# Patient Record
Sex: Female | Born: 1980
Health system: Southern US, Community
[De-identification: ages and names within clinical notes are randomized; demographics above are authoritative.]

## PROBLEM LIST (undated history)

## (undated) ENCOUNTER — Inpatient Hospital Stay (HOSPITAL_COMMUNITY): Payer: Self-pay

## (undated) DIAGNOSIS — R569 Unspecified convulsions: Secondary | ICD-10-CM

## (undated) DIAGNOSIS — Z8674 Personal history of sudden cardiac arrest: Secondary | ICD-10-CM

## (undated) DIAGNOSIS — G9340 Encephalopathy, unspecified: Secondary | ICD-10-CM

## (undated) DIAGNOSIS — R001 Bradycardia, unspecified: Secondary | ICD-10-CM

## (undated) DIAGNOSIS — Z4502 Encounter for adjustment and management of automatic implantable cardiac defibrillator: Secondary | ICD-10-CM

## (undated) DIAGNOSIS — J9601 Acute respiratory failure with hypoxia: Secondary | ICD-10-CM

## (undated) DIAGNOSIS — I469 Cardiac arrest, cause unspecified: Secondary | ICD-10-CM

## (undated) DIAGNOSIS — G931 Anoxic brain damage, not elsewhere classified: Secondary | ICD-10-CM

## (undated) DIAGNOSIS — Z95810 Presence of automatic (implantable) cardiac defibrillator: Secondary | ICD-10-CM

## (undated) DIAGNOSIS — I4901 Ventricular fibrillation: Secondary | ICD-10-CM

## (undated) DIAGNOSIS — Z1501 Genetic susceptibility to malignant neoplasm of breast: Secondary | ICD-10-CM

## (undated) HISTORY — DX: Ventricular fibrillation: I49.01

## (undated) HISTORY — PX: MASTECTOMY W/ SENTINEL NODE BIOPSY: SHX2001

## (undated) HISTORY — DX: Unspecified convulsions: R56.9

## (undated) HISTORY — PX: TUBAL LIGATION: SHX77

## (undated) HISTORY — PX: BREAST RECONSTRUCTION: SHX9

## (undated) HISTORY — DX: Encephalopathy, unspecified: G93.40

## (undated) HISTORY — DX: Bradycardia, unspecified: R00.1

## (undated) HISTORY — DX: Anoxic brain damage, not elsewhere classified: G93.1

## (undated) HISTORY — DX: Cardiac arrest, cause unspecified: I46.9

## (undated) HISTORY — DX: Acute respiratory failure with hypoxia: J96.01

## (undated) HISTORY — DX: Genetic susceptibility to malignant neoplasm of breast: Z15.01

---
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
## 2014-12-22 DIAGNOSIS — Z202 Contact with and (suspected) exposure to infections with a predominantly sexual mode of transmission: Secondary | ICD-10-CM | POA: Diagnosis not present

## 2014-12-22 DIAGNOSIS — Z113 Encounter for screening for infections with a predominantly sexual mode of transmission: Secondary | ICD-10-CM | POA: Diagnosis not present

## 2015-05-28 DIAGNOSIS — R21 Rash and other nonspecific skin eruption: Secondary | ICD-10-CM | POA: Diagnosis not present

## 2015-05-28 DIAGNOSIS — L299 Pruritus, unspecified: Secondary | ICD-10-CM | POA: Diagnosis not present

## 2015-05-28 DIAGNOSIS — T7840XA Allergy, unspecified, initial encounter: Secondary | ICD-10-CM | POA: Diagnosis not present

## 2016-10-21 ENCOUNTER — Emergency Department (HOSPITAL_COMMUNITY): Payer: Medicare Other

## 2016-10-21 ENCOUNTER — Encounter (HOSPITAL_COMMUNITY): Payer: Self-pay | Admitting: Emergency Medicine

## 2016-10-21 ENCOUNTER — Inpatient Hospital Stay (HOSPITAL_COMMUNITY)
Admission: EM | Admit: 2016-10-21 | Discharge: 2016-11-01 | DRG: 286 | Disposition: A | Discharge status: Home Health | Payer: Medicare Other | Attending: Internal Medicine | Admitting: Internal Medicine

## 2016-10-21 ENCOUNTER — Encounter (HOSPITAL_COMMUNITY)
Admission: EM | Disposition: A | Discharge status: Home Health | Payer: Self-pay | Source: Home / Self Care | Attending: Internal Medicine

## 2016-10-21 DIAGNOSIS — I4901 Ventricular fibrillation: Secondary | ICD-10-CM | POA: Diagnosis present

## 2016-10-21 DIAGNOSIS — I420 Dilated cardiomyopathy: Secondary | ICD-10-CM | POA: Diagnosis present

## 2016-10-21 DIAGNOSIS — R739 Hyperglycemia, unspecified: Secondary | ICD-10-CM | POA: Diagnosis present

## 2016-10-21 DIAGNOSIS — R109 Unspecified abdominal pain: Secondary | ICD-10-CM

## 2016-10-21 DIAGNOSIS — K7200 Acute and subacute hepatic failure without coma: Secondary | ICD-10-CM | POA: Diagnosis present

## 2016-10-21 DIAGNOSIS — R251 Tremor, unspecified: Secondary | ICD-10-CM

## 2016-10-21 DIAGNOSIS — E872 Acidosis: Secondary | ICD-10-CM | POA: Diagnosis present

## 2016-10-21 DIAGNOSIS — E876 Hypokalemia: Secondary | ICD-10-CM | POA: Diagnosis present

## 2016-10-21 DIAGNOSIS — G931 Anoxic brain damage, not elsewhere classified: Secondary | ICD-10-CM | POA: Diagnosis present

## 2016-10-21 DIAGNOSIS — E162 Hypoglycemia, unspecified: Secondary | ICD-10-CM | POA: Diagnosis not present

## 2016-10-21 DIAGNOSIS — R9431 Abnormal electrocardiogram [ECG] [EKG]: Secondary | ICD-10-CM | POA: Diagnosis not present

## 2016-10-21 DIAGNOSIS — N390 Urinary tract infection, site not specified: Secondary | ICD-10-CM | POA: Diagnosis present

## 2016-10-21 DIAGNOSIS — Z978 Presence of other specified devices: Secondary | ICD-10-CM

## 2016-10-21 DIAGNOSIS — J9811 Atelectasis: Secondary | ICD-10-CM | POA: Diagnosis present

## 2016-10-21 DIAGNOSIS — R930 Abnormal findings on diagnostic imaging of skull and head, not elsewhere classified: Secondary | ICD-10-CM | POA: Diagnosis not present

## 2016-10-21 DIAGNOSIS — F1010 Alcohol abuse, uncomplicated: Secondary | ICD-10-CM | POA: Diagnosis present

## 2016-10-21 DIAGNOSIS — J9601 Acute respiratory failure with hypoxia: Secondary | ICD-10-CM | POA: Diagnosis present

## 2016-10-21 DIAGNOSIS — J9600 Acute respiratory failure, unspecified whether with hypoxia or hypercapnia: Secondary | ICD-10-CM

## 2016-10-21 DIAGNOSIS — J9690 Respiratory failure, unspecified, unspecified whether with hypoxia or hypercapnia: Secondary | ICD-10-CM

## 2016-10-21 DIAGNOSIS — R2689 Other abnormalities of gait and mobility: Secondary | ICD-10-CM

## 2016-10-21 DIAGNOSIS — Z452 Encounter for adjustment and management of vascular access device: Secondary | ICD-10-CM

## 2016-10-21 DIAGNOSIS — R29898 Other symptoms and signs involving the musculoskeletal system: Secondary | ICD-10-CM

## 2016-10-21 DIAGNOSIS — Z01818 Encounter for other preprocedural examination: Secondary | ICD-10-CM

## 2016-10-21 DIAGNOSIS — Z9289 Personal history of other medical treatment: Secondary | ICD-10-CM

## 2016-10-21 DIAGNOSIS — G9340 Encephalopathy, unspecified: Secondary | ICD-10-CM | POA: Diagnosis present

## 2016-10-21 DIAGNOSIS — Z4682 Encounter for fitting and adjustment of non-vascular catheter: Secondary | ICD-10-CM | POA: Diagnosis not present

## 2016-10-21 DIAGNOSIS — I469 Cardiac arrest, cause unspecified: Principal | ICD-10-CM | POA: Diagnosis present

## 2016-10-21 DIAGNOSIS — J690 Pneumonitis due to inhalation of food and vomit: Secondary | ICD-10-CM | POA: Diagnosis present

## 2016-10-21 DIAGNOSIS — G4089 Other seizures: Secondary | ICD-10-CM | POA: Diagnosis present

## 2016-10-21 DIAGNOSIS — R1310 Dysphagia, unspecified: Secondary | ICD-10-CM | POA: Diagnosis present

## 2016-10-21 DIAGNOSIS — R569 Unspecified convulsions: Secondary | ICD-10-CM

## 2016-10-21 DIAGNOSIS — Z9911 Dependence on respirator [ventilator] status: Secondary | ICD-10-CM

## 2016-10-21 DIAGNOSIS — R0902 Hypoxemia: Secondary | ICD-10-CM

## 2016-10-21 DIAGNOSIS — F17210 Nicotine dependence, cigarettes, uncomplicated: Secondary | ICD-10-CM | POA: Diagnosis present

## 2016-10-21 HISTORY — PX: CARDIAC CATHETERIZATION: SHX172

## 2016-10-21 LAB — COMPREHENSIVE METABOLIC PANEL
ALK PHOS: 91 U/L (ref 38–126)
ALT: 679 U/L — ABNORMAL HIGH (ref 14–54)
ANION GAP: 14 (ref 5–15)
AST: 732 U/L — ABNORMAL HIGH (ref 15–41)
Albumin: 3.4 g/dL — ABNORMAL LOW (ref 3.5–5.0)
BILIRUBIN TOTAL: 0.1 mg/dL — AB (ref 0.3–1.2)
BUN: 12 mg/dL (ref 6–20)
CALCIUM: 8 mg/dL — AB (ref 8.9–10.3)
CO2: 17 mmol/L — ABNORMAL LOW (ref 22–32)
Chloride: 102 mmol/L (ref 101–111)
Creatinine, Ser: 1.26 mg/dL — ABNORMAL HIGH (ref 0.44–1.00)
GFR calc Af Amer: >60 mL/min (ref >60)
GFR, EST NON AFRICAN AMERICAN: 55 mL/min — AB (ref >60)
Glucose, Bld: 233 mg/dL — ABNORMAL HIGH (ref 65–99)
POTASSIUM: 3.5 mmol/L (ref 3.5–5.1)
Sodium: 133 mmol/L — ABNORMAL LOW (ref 135–145)
TOTAL PROTEIN: 6.8 g/dL (ref 6.5–8.1)

## 2016-10-21 LAB — CBC
HEMATOCRIT: 40.7 % (ref 36.0–46.0)
HEMOGLOBIN: 13 g/dL (ref 12.0–15.0)
MCH: 29.5 pg (ref 26.0–34.0)
MCHC: 31.9 g/dL (ref 30.0–36.0)
MCV: 92.3 fL (ref 78.0–100.0)
Platelets: 239 10*3/uL (ref 150–400)
RBC: 4.41 MIL/uL (ref 3.87–5.11)
RDW: 13.6 % (ref 11.5–15.5)
WBC: 14.5 10*3/uL — AB (ref 4.0–10.5)

## 2016-10-21 LAB — I-STAT CHEM 8, ED
BUN: 14 mg/dL (ref 6–20)
CREATININE: 1.1 mg/dL — AB (ref 0.44–1.00)
Calcium, Ion: 1.15 mmol/L (ref 1.15–1.40)
Chloride: 105 mmol/L (ref 101–111)
Glucose, Bld: 229 mg/dL — ABNORMAL HIGH (ref 65–99)
HEMATOCRIT: 41 % (ref 36.0–46.0)
HEMOGLOBIN: 13.9 g/dL (ref 12.0–15.0)
POTASSIUM: 3.6 mmol/L (ref 3.5–5.1)
SODIUM: 141 mmol/L (ref 135–145)
TCO2: 19 mmol/L (ref 0–100)

## 2016-10-21 LAB — I-STAT CG4 LACTIC ACID, ED: LACTIC ACID, VENOUS: 8.5 mmol/L — AB (ref 0.5–1.9)

## 2016-10-21 LAB — MAGNESIUM: MAGNESIUM: 2.2 mg/dL (ref 1.7–2.4)

## 2016-10-21 LAB — I-STAT BETA HCG BLOOD, ED (MC, WL, AP ONLY): I-stat hCG, quantitative: <5 m[IU]/mL (ref <5)

## 2016-10-21 LAB — I-STAT TROPONIN, ED: Troponin i, poc: 0.08 ng/mL (ref 0.00–0.08)

## 2016-10-21 LAB — ETHANOL: ALCOHOL ETHYL (B): 6 mg/dL — AB (ref <5)

## 2016-10-21 SURGERY — LEFT HEART CATH AND CORONARY ANGIOGRAPHY
Anesthesia: LOCAL

## 2016-10-21 MED ORDER — SODIUM CHLORIDE 0.9 % IV BOLUS (SEPSIS)
1000.0000 mL | Freq: Once | INTRAVENOUS | Status: AC
  Administered 2016-10-21: 1000 mL via INTRAVENOUS

## 2016-10-21 MED ORDER — FENTANYL 2500MCG IN NS 250ML (10MCG/ML) PREMIX INFUSION
30.0000 ug/h | INTRAVENOUS | Status: DC
  Administered 2016-10-21: 30 ug/h via INTRAVENOUS
  Filled 2016-10-21: qty 250

## 2016-10-21 MED ORDER — ETOMIDATE 2 MG/ML IV SOLN
INTRAVENOUS | Status: DC | PRN
  Administered 2016-10-21: 30 mg via INTRAVENOUS

## 2016-10-21 MED ORDER — ROCURONIUM BROMIDE 50 MG/5ML IV SOLN
INTRAVENOUS | Status: DC | PRN
  Administered 2016-10-21: 100 mg via INTRAVENOUS

## 2016-10-21 MED ORDER — MIDAZOLAM HCL 2 MG/2ML IJ SOLN
1.0000 mg | INTRAMUSCULAR | Status: DC | PRN
  Administered 2016-10-21: 1 mg via INTRAVENOUS
  Filled 2016-10-21: qty 2

## 2016-10-21 SURGICAL SUPPLY — 10 items
CATH OPTITORQUE TIG 4.0 5F (CATHETERS) ×2 IMPLANT
DEVICE RAD COMP TR BAND LRG (VASCULAR PRODUCTS) ×2 IMPLANT
GLIDESHEATH SLEND A-KIT 6F 20G (SHEATH) ×2 IMPLANT
GUIDEWIRE INQWIRE 1.5J.035X260 (WIRE) ×1 IMPLANT
INQWIRE 1.5J .035X260CM (WIRE) ×2
KIT ENCORE 26 ADVANTAGE (KITS) ×2 IMPLANT
KIT HEART LEFT (KITS) ×2 IMPLANT
PACK CARDIAC CATHETERIZATION (CUSTOM PROCEDURE TRAY) ×2 IMPLANT
TRANSDUCER W/STOPCOCK (MISCELLANEOUS) ×2 IMPLANT
TUBING CIL FLEX 10 FLL-RA (TUBING) ×2 IMPLANT

## 2016-10-21 NOTE — ED Notes (Signed)
Ice packs placed to bilateral axilla and groin.

## 2016-10-21 NOTE — ED Provider Notes (Signed)
[facility] DEPT Provider Note   CSN: [id number] Arrival date & time:        History   Chief Complaint Chief Complaint  Patient presents with  . Cardiac Arrest    HPI Rachel Duncan is a 35 y.o. female.   Cardiac Arrest  Witnessed by:  Bystander Incident location:  Home Condition upon EMS arrival:  Unresponsive Pulse:  Absent Initial cardiac rhythm per EMS:  Ventricular fibrillation Treatments prior to arrival:  ACLS protocol Medications given prior to ED:  Epinephrine Airway:  Intubation in ED Rhythm on admission to ED:  Sinus tachycardia Associated symptoms comment:  Seizure   History reviewed. No pertinent past medical history.  There are no active problems to display for this patient.   History reviewed. No pertinent surgical history.  OB History    No data available       Home Medications    Prior to Admission medications   Not on File    Family History No family history on file.  Social History Social History  Substance Use Topics  . Smoking status: Not on file  . Smokeless tobacco: Not on file  . Alcohol use Not on file     Allergies   Patient has no known allergies.   Review of Systems Review of Systems  Unable to perform ROS: Acuity of condition     Physical Exam Updated Vital Signs BP (!) 83/66   Pulse 69   Temp 98.2 F (36.8 C) (Rectal)   Resp 18   LMP  (LMP Unknown)   SpO2 95%   Physical Exam  Constitutional: She appears well-developed and well-nourished. She appears ill. Cervical collar in place.  HENT:  Head: Normocephalic and atraumatic.  Eyes: Conjunctivae are normal. Pupils are equal, round, and reactive to light.  Neck: Neck supple. No tracheal deviation present.  Cardiovascular: Tachycardia present.  Exam reveals no gallop, no S3, no S4 and no decreased pulses.   No murmur heard. Pulses:      Carotid pulses are 1+ on the right side, and 1+ on the left side.      Radial pulses are 1+ on the right side,  and 1+ on the left side.       Femoral pulses are 1+ on the right side, and 1+ on the left side. Pulmonary/Chest: She is in respiratory distress. She has no wheezes. She has rhonchi. She has no rales.  Abdominal: Soft. Bowel sounds are normal. She exhibits distension.  Musculoskeletal: Normal range of motion. She exhibits no edema or deformity.  Neurological: She is unresponsive. GCS eye subscore is 1. GCS verbal subscore is 1. GCS motor subscore is 1.  Unable to assess full neurologic exam due to patient's status.     ED Treatments / Results  Labs (all labs ordered are listed, but only abnormal results are displayed) Labs Reviewed  I-STAT CHEM 8, ED - Abnormal; Notable for the following:       Result Value   Creatinine, Ser 1.10 (*)    Glucose, Bld 229 (*)    All other components within normal limits  I-STAT CG4 LACTIC ACID, ED - Abnormal; Notable for the following:    Lactic Acid, Venous 8.50 (*)    All other components within normal limits  COMPREHENSIVE METABOLIC PANEL  CBC  MAGNESIUM  ETHANOL  URINALYSIS, ROUTINE W REFLEX MICROSCOPIC (NOT AT [facility])  RAPID URINE DRUG SCREEN, HOSP PERFORMED  I-STAT TROPOININ, ED  I-STAT BETA HCG BLOOD, ED ([facility], [facility], [facility]  ONLY)    EKG  EKG Interpretation None       Radiology No results found.  Procedures Procedure Name: Intubation Date/Time: 10/22/2016 12:19 AM Performed by: Ron Parker, Terre Zabriskie Pre-anesthesia Checklist: Patient identified Oxygen Delivery Method: Nasal cannula Preoxygenation: Pre-oxygenation with 100% oxygen Intubation Type: IV induction and Rapid sequence Laryngoscope Size: Glidescope Grade View: Grade II Tube size: 7.5 mm Number of attempts: 2 Airway Equipment and Method: Stylet Placement Confirmation: ETT inserted through vocal cords under direct vision,  Positive ETCO2,  CO2 detector and Breath sounds checked- equal and bilateral Tube secured with: ETT holder      (including critical care time)  Medications  Ordered in ED Medications  etomidate (AMIDATE) injection (30 mg Intravenous Given 10/21/16 2252)  rocuronium (ZEMURON) injection (100 mg Intravenous Given 10/21/16 2253)  fentaNYL 2573mcg in NS 230mL (54mcg/ml) infusion-PREMIX (not administered)  midazolam (VERSED) injection 1 mg (not administered)     Initial Impression / Assessment and Plan / ED Course  I have reviewed the triage vital signs and the nursing notes.  Pertinent labs & imaging results that were available during my care of the patient were reviewed by me and considered in my medical decision making (see chart for details).  Clinical Course     35 year old female who reports to [place] after cardiac arrest. She reports possible seizure activity as well. A significant medical history reported as chronic alcohol abuse. Report of V. fib in the field C applied and epinephrine given. Return of spontaneous circulation and sinus tachycardia. She had a supraglottic airway in place when she arrived large amount of stomach contents in the mouth and of the tube. Patient was then intubated using etomidate and rocuronium. Initial troponin is 0.08. Initial lactic of 8. Patient is not pregnant chest x-ray shows cardiomegaly. Possibly a dilated cardiomyopathy secondary to alcoholism. CT head shows no acute intracranial abnormalities 5 by I overweight for mole CT read from radiology. Electrolytes are within normal limits but shows kidney and liver dysfunction likely secondary to the cardiac arrest. Patient will go to the cardiology team for Cath Lab and then to the ICU team. Further management of this patient's care please see inpatient team notes.  Final Clinical Impressions(s) / ED Diagnoses   Final diagnoses:  None    New Prescriptions New Prescriptions   No medications on file     Dewaine Conger, MD 10/22/16 0020    Merrily Pew, MD 10/22/16 [id number]

## 2016-10-21 NOTE — ED Triage Notes (Signed)
Patient was at home, had seizure and fell to floor.  FD responded and placed AED and she was shocked x3.  Upon EMS arrival, patient was placed on cardiac monitor, was found to be in V-Fib and shocked x 2.  Patient was given one IV Epi and regained ROSC.  Capnography of 41.  Patient became combative in EMS truck, was given 2.5mg  Versed en route to ED.

## 2016-10-22 ENCOUNTER — Other Ambulatory Visit (HOSPITAL_COMMUNITY): Payer: Self-pay

## 2016-10-22 ENCOUNTER — Encounter (HOSPITAL_COMMUNITY): Payer: Self-pay | Admitting: Radiology

## 2016-10-22 ENCOUNTER — Inpatient Hospital Stay (HOSPITAL_COMMUNITY): Payer: Medicare Other

## 2016-10-22 ENCOUNTER — Emergency Department (HOSPITAL_COMMUNITY): Payer: Medicare Other

## 2016-10-22 DIAGNOSIS — F1010 Alcohol abuse, uncomplicated: Secondary | ICD-10-CM | POA: Diagnosis present

## 2016-10-22 DIAGNOSIS — E876 Hypokalemia: Secondary | ICD-10-CM | POA: Diagnosis present

## 2016-10-22 DIAGNOSIS — J9690 Respiratory failure, unspecified, unspecified whether with hypoxia or hypercapnia: Secondary | ICD-10-CM | POA: Diagnosis not present

## 2016-10-22 DIAGNOSIS — Z4682 Encounter for fitting and adjustment of non-vascular catheter: Secondary | ICD-10-CM | POA: Diagnosis not present

## 2016-10-22 DIAGNOSIS — I420 Dilated cardiomyopathy: Secondary | ICD-10-CM | POA: Diagnosis not present

## 2016-10-22 DIAGNOSIS — F17210 Nicotine dependence, cigarettes, uncomplicated: Secondary | ICD-10-CM | POA: Diagnosis present

## 2016-10-22 DIAGNOSIS — J9811 Atelectasis: Secondary | ICD-10-CM | POA: Diagnosis not present

## 2016-10-22 DIAGNOSIS — J9601 Acute respiratory failure with hypoxia: Secondary | ICD-10-CM | POA: Diagnosis present

## 2016-10-22 DIAGNOSIS — R9431 Abnormal electrocardiogram [ECG] [EKG]: Secondary | ICD-10-CM | POA: Diagnosis not present

## 2016-10-22 DIAGNOSIS — I469 Cardiac arrest, cause unspecified: Secondary | ICD-10-CM | POA: Diagnosis present

## 2016-10-22 DIAGNOSIS — G4089 Other seizures: Secondary | ICD-10-CM | POA: Diagnosis not present

## 2016-10-22 DIAGNOSIS — R1310 Dysphagia, unspecified: Secondary | ICD-10-CM | POA: Diagnosis not present

## 2016-10-22 DIAGNOSIS — R569 Unspecified convulsions: Secondary | ICD-10-CM | POA: Diagnosis not present

## 2016-10-22 DIAGNOSIS — J690 Pneumonitis due to inhalation of food and vomit: Secondary | ICD-10-CM | POA: Diagnosis not present

## 2016-10-22 DIAGNOSIS — G9340 Encephalopathy, unspecified: Secondary | ICD-10-CM

## 2016-10-22 DIAGNOSIS — R739 Hyperglycemia, unspecified: Secondary | ICD-10-CM | POA: Diagnosis present

## 2016-10-22 DIAGNOSIS — N390 Urinary tract infection, site not specified: Secondary | ICD-10-CM | POA: Diagnosis not present

## 2016-10-22 DIAGNOSIS — R930 Abnormal findings on diagnostic imaging of skull and head, not elsewhere classified: Secondary | ICD-10-CM | POA: Diagnosis not present

## 2016-10-22 DIAGNOSIS — G931 Anoxic brain damage, not elsewhere classified: Secondary | ICD-10-CM | POA: Diagnosis not present

## 2016-10-22 DIAGNOSIS — K7200 Acute and subacute hepatic failure without coma: Secondary | ICD-10-CM | POA: Diagnosis not present

## 2016-10-22 DIAGNOSIS — R0602 Shortness of breath: Secondary | ICD-10-CM | POA: Diagnosis not present

## 2016-10-22 DIAGNOSIS — Z452 Encounter for adjustment and management of vascular access device: Secondary | ICD-10-CM | POA: Diagnosis not present

## 2016-10-22 DIAGNOSIS — R109 Unspecified abdominal pain: Secondary | ICD-10-CM | POA: Diagnosis not present

## 2016-10-22 DIAGNOSIS — I4901 Ventricular fibrillation: Secondary | ICD-10-CM | POA: Diagnosis not present

## 2016-10-22 DIAGNOSIS — J9600 Acute respiratory failure, unspecified whether with hypoxia or hypercapnia: Secondary | ICD-10-CM | POA: Diagnosis not present

## 2016-10-22 DIAGNOSIS — E162 Hypoglycemia, unspecified: Secondary | ICD-10-CM | POA: Diagnosis not present

## 2016-10-22 DIAGNOSIS — E872 Acidosis: Secondary | ICD-10-CM | POA: Diagnosis not present

## 2016-10-22 DIAGNOSIS — R102 Pelvic and perineal pain: Secondary | ICD-10-CM | POA: Diagnosis not present

## 2016-10-22 LAB — POCT I-STAT, CHEM 8
BUN: 12 mg/dL (ref 6–20)
BUN: 11 mg/dL (ref 6–20)
CALCIUM ION: 1.11 mmol/L — AB (ref 1.15–1.40)
CALCIUM ION: 1.1 mmol/L — AB (ref 1.15–1.40)
CREATININE: 0.7 mg/dL (ref 0.44–1.00)
CREATININE: 0.6 mg/dL (ref 0.44–1.00)
Chloride: 113 mmol/L — ABNORMAL HIGH (ref 101–111)
Chloride: 114 mmol/L — ABNORMAL HIGH (ref 101–111)
GLUCOSE: 165 mg/dL — AB (ref 65–99)
GLUCOSE: 167 mg/dL — AB (ref 65–99)
HCT: 34 % — ABNORMAL LOW (ref 36.0–46.0)
HEMATOCRIT: 37 % (ref 36.0–46.0)
HEMOGLOBIN: 12.6 g/dL (ref 12.0–15.0)
HEMOGLOBIN: 11.6 g/dL — AB (ref 12.0–15.0)
POTASSIUM: 4.2 mmol/L (ref 3.5–5.1)
Potassium: 4 mmol/L (ref 3.5–5.1)
Sodium: 144 mmol/L (ref 135–145)
Sodium: 143 mmol/L (ref 135–145)
TCO2: 18 mmol/L (ref 0–100)
TCO2: 15 mmol/L (ref 0–100)

## 2016-10-22 LAB — BASIC METABOLIC PANEL
ANION GAP: 10 (ref 5–15)
Anion gap: 5 (ref 5–15)
Anion gap: 5 (ref 5–15)
Anion gap: 6 (ref 5–15)
Anion gap: 6 (ref 5–15)
BUN: 11 mg/dL (ref 6–20)
BUN: 10 mg/dL (ref 6–20)
BUN: 8 mg/dL (ref 6–20)
BUN: 7 mg/dL (ref 6–20)
BUN: 5 mg/dL — AB (ref 6–20)
CALCIUM: 7.5 mg/dL — AB (ref 8.9–10.3)
CHLORIDE: 115 mmol/L — AB (ref 101–111)
CHLORIDE: 116 mmol/L — AB (ref 101–111)
CO2: 17 mmol/L — ABNORMAL LOW (ref 22–32)
CO2: 16 mmol/L — ABNORMAL LOW (ref 22–32)
CO2: 16 mmol/L — ABNORMAL LOW (ref 22–32)
CO2: 17 mmol/L — ABNORMAL LOW (ref 22–32)
CO2: 16 mmol/L — ABNORMAL LOW (ref 22–32)
CREATININE: 0.61 mg/dL (ref 0.44–1.00)
CREATININE: 0.52 mg/dL (ref 0.44–1.00)
CREATININE: 0.52 mg/dL (ref 0.44–1.00)
CREATININE: 0.51 mg/dL (ref 0.44–1.00)
Calcium: 7.6 mg/dL — ABNORMAL LOW (ref 8.9–10.3)
Calcium: 8 mg/dL — ABNORMAL LOW (ref 8.9–10.3)
Calcium: 8.1 mg/dL — ABNORMAL LOW (ref 8.9–10.3)
Calcium: 8.1 mg/dL — ABNORMAL LOW (ref 8.9–10.3)
Chloride: 111 mmol/L (ref 101–111)
Chloride: 117 mmol/L — ABNORMAL HIGH (ref 101–111)
Chloride: 115 mmol/L — ABNORMAL HIGH (ref 101–111)
Creatinine, Ser: 1 mg/dL (ref 0.44–1.00)
GFR calc Af Amer: >60 mL/min (ref >60)
GFR calc Af Amer: >60 mL/min (ref >60)
GFR calc Af Amer: >60 mL/min (ref >60)
GFR calc Af Amer: >60 mL/min (ref >60)
GFR calc Af Amer: >60 mL/min (ref >60)
GFR calc non Af Amer: >60 mL/min (ref >60)
GLUCOSE: 147 mg/dL — AB (ref 65–99)
GLUCOSE: 140 mg/dL — AB (ref 65–99)
GLUCOSE: 121 mg/dL — AB (ref 65–99)
GLUCOSE: 118 mg/dL — AB (ref 65–99)
Glucose, Bld: 240 mg/dL — ABNORMAL HIGH (ref 65–99)
POTASSIUM: 3.3 mmol/L — AB (ref 3.5–5.1)
POTASSIUM: 3.9 mmol/L (ref 3.5–5.1)
POTASSIUM: 3.7 mmol/L (ref 3.5–5.1)
POTASSIUM: 3.4 mmol/L — AB (ref 3.5–5.1)
POTASSIUM: 3.7 mmol/L (ref 3.5–5.1)
SODIUM: 138 mmol/L (ref 135–145)
SODIUM: 138 mmol/L (ref 135–145)
SODIUM: 136 mmol/L (ref 135–145)
Sodium: 138 mmol/L (ref 135–145)
Sodium: 138 mmol/L (ref 135–145)

## 2016-10-22 LAB — RAPID URINE DRUG SCREEN, HOSP PERFORMED
AMPHETAMINES: NOT DETECTED
Barbiturates: NOT DETECTED
Benzodiazepines: POSITIVE — AB
Cocaine: NOT DETECTED
OPIATES: NOT DETECTED
TETRAHYDROCANNABINOL: NOT DETECTED

## 2016-10-22 LAB — URINALYSIS, ROUTINE W REFLEX MICROSCOPIC
BILIRUBIN URINE: NEGATIVE
Bilirubin Urine: NEGATIVE
Glucose, UA: 250 mg/dL — AB
Glucose, UA: NEGATIVE mg/dL
KETONES UR: NEGATIVE mg/dL
Ketones, ur: NEGATIVE mg/dL
Leukocytes, UA: NEGATIVE
NITRITE: POSITIVE — AB
Nitrite: NEGATIVE
PH: 6 (ref 5.0–8.0)
Protein, ur: >300 mg/dL — AB
Protein, ur: NEGATIVE mg/dL
SPECIFIC GRAVITY, URINE: 1.027 (ref 1.005–1.030)
SPECIFIC GRAVITY, URINE: 1.016 (ref 1.005–1.030)
pH: 5 (ref 5.0–8.0)

## 2016-10-22 LAB — PROTIME-INR
INR: 1.28
INR: 1.23
PROTHROMBIN TIME: 16.1 s — AB (ref 11.4–15.2)
PROTHROMBIN TIME: 15.6 s — AB (ref 11.4–15.2)

## 2016-10-22 LAB — BLOOD GAS, ARTERIAL
ACID-BASE DEFICIT: 10.7 mmol/L — AB (ref 0.0–2.0)
Acid-base deficit: 9.5 mmol/L — ABNORMAL HIGH (ref 0.0–2.0)
BICARBONATE: 16.3 mmol/L — AB (ref 20.0–28.0)
Bicarbonate: 16.4 mmol/L — ABNORMAL LOW (ref 20.0–28.0)
DRAWN BY: 24513
Drawn by: 252031
FIO2: 0.4
FIO2: 40
LHR: 14 {breaths}/min
MECHVT: 400 mL
O2 SAT: 98.7 %
O2 SAT: 98.3 %
PATIENT TEMPERATURE: 98.6
PATIENT TEMPERATURE: 94.3
PCO2 ART: 38.8 mmHg (ref 32.0–48.0)
PCO2 ART: 42.3 mmHg (ref 32.0–48.0)
PEEP/CPAP: 5 cmH2O
PEEP: 5 cmH2O
PH ART: 7.248 — AB (ref 7.350–7.450)
PH ART: 7.195 — AB (ref 7.350–7.450)
PO2 ART: 171 mmHg — AB (ref 83.0–108.0)
PO2 ART: 146 mmHg — AB (ref 83.0–108.0)
RATE: 14 resp/min
VT: 400 mL

## 2016-10-22 LAB — APTT
aPTT: 21 seconds — ABNORMAL LOW (ref 24–36)
aPTT: 29 seconds (ref 24–36)

## 2016-10-22 LAB — POCT I-STAT 3, ART BLOOD GAS (G3+)
ACID-BASE DEFICIT: 13 mmol/L — AB (ref 0.0–2.0)
BICARBONATE: 13.4 mmol/L — AB (ref 20.0–28.0)
O2 SAT: 100 %
PO2 ART: 188 mmHg — AB (ref 83.0–108.0)
TCO2: 14 mmol/L (ref 0–100)
pCO2 arterial: 25.1 mmHg — ABNORMAL LOW (ref 32.0–48.0)
pH, Arterial: 7.31 — ABNORMAL LOW (ref 7.350–7.450)

## 2016-10-22 LAB — TROPONIN I
TROPONIN I: 0.81 ng/mL — AB (ref <0.03)
TROPONIN I: 0.68 ng/mL — AB (ref <0.03)
Troponin I: 0.88 ng/mL (ref <0.03)
Troponin I: 1.19 ng/mL (ref <0.03)

## 2016-10-22 LAB — LACTIC ACID, PLASMA
LACTIC ACID, VENOUS: 2 mmol/L — AB (ref 0.5–1.9)
LACTIC ACID, VENOUS: 1.6 mmol/L (ref 0.5–1.9)
Lactic Acid, Venous: 4.5 mmol/L (ref 0.5–1.9)

## 2016-10-22 LAB — PHOSPHORUS: PHOSPHORUS: 2.8 mg/dL (ref 2.5–4.6)

## 2016-10-22 LAB — GLUCOSE, CAPILLARY
GLUCOSE-CAPILLARY: 161 mg/dL — AB (ref 65–99)
GLUCOSE-CAPILLARY: 147 mg/dL — AB (ref 65–99)
GLUCOSE-CAPILLARY: 131 mg/dL — AB (ref 65–99)
GLUCOSE-CAPILLARY: 157 mg/dL — AB (ref 65–99)
GLUCOSE-CAPILLARY: 133 mg/dL — AB (ref 65–99)
GLUCOSE-CAPILLARY: 130 mg/dL — AB (ref 65–99)
GLUCOSE-CAPILLARY: 123 mg/dL — AB (ref 65–99)
Glucose-Capillary: 173 mg/dL — ABNORMAL HIGH (ref 65–99)
Glucose-Capillary: 156 mg/dL — ABNORMAL HIGH (ref 65–99)
Glucose-Capillary: 131 mg/dL — ABNORMAL HIGH (ref 65–99)
Glucose-Capillary: 145 mg/dL — ABNORMAL HIGH (ref 65–99)
Glucose-Capillary: 114 mg/dL — ABNORMAL HIGH (ref 65–99)

## 2016-10-22 LAB — CBC
HCT: 37 % (ref 36.0–46.0)
HEMOGLOBIN: 12.1 g/dL (ref 12.0–15.0)
MCH: 29.7 pg (ref 26.0–34.0)
MCHC: 32.7 g/dL (ref 30.0–36.0)
MCV: 90.9 fL (ref 78.0–100.0)
PLATELETS: 238 10*3/uL (ref 150–400)
RBC: 4.07 MIL/uL (ref 3.87–5.11)
RDW: 13.5 % (ref 11.5–15.5)
WBC: 12.3 10*3/uL — ABNORMAL HIGH (ref 4.0–10.5)

## 2016-10-22 LAB — URINE MICROSCOPIC-ADD ON

## 2016-10-22 LAB — HEPATIC FUNCTION PANEL
ALK PHOS: 45 U/L (ref 38–126)
ALT: 512 U/L — AB (ref 14–54)
AST: 793 U/L — AB (ref 15–41)
Albumin: 2.8 g/dL — ABNORMAL LOW (ref 3.5–5.0)
TOTAL PROTEIN: 5.3 g/dL — AB (ref 6.5–8.1)
Total Bilirubin: 0.4 mg/dL (ref 0.3–1.2)

## 2016-10-22 LAB — CK TOTAL AND CKMB (NOT AT ARMC)
CK, MB: 81.5 ng/mL — ABNORMAL HIGH (ref 0.5–5.0)
RELATIVE INDEX: 2.1 (ref 0.0–2.5)
Total CK: 3820 U/L — ABNORMAL HIGH (ref 38–234)

## 2016-10-22 LAB — MRSA PCR SCREENING: MRSA by PCR: NEGATIVE

## 2016-10-22 LAB — MAGNESIUM: MAGNESIUM: 1.7 mg/dL (ref 1.7–2.4)

## 2016-10-22 MED ORDER — SODIUM CHLORIDE 0.9 % IV SOLN
INTRAVENOUS | Status: AC
  Administered 2016-10-22: 04:00:00 via INTRAVENOUS

## 2016-10-22 MED ORDER — VERAPAMIL HCL 2.5 MG/ML IV SOLN
INTRAVENOUS | Status: AC
  Filled 2016-10-22: qty 2

## 2016-10-22 MED ORDER — VERAPAMIL HCL 2.5 MG/ML IV SOLN
INTRA_ARTERIAL | Status: DC | PRN
  Administered 2016-10-22: 7.5 mL via INTRA_ARTERIAL

## 2016-10-22 MED ORDER — CISATRACURIUM BOLUS VIA INFUSION
0.1000 mg/kg | Freq: Once | INTRAVENOUS | Status: AC
  Administered 2016-10-22: 7.7 mg via INTRAVENOUS
  Filled 2016-10-22: qty 8

## 2016-10-22 MED ORDER — SODIUM CHLORIDE 0.9% FLUSH
3.0000 mL | Freq: Two times a day (BID) | INTRAVENOUS | Status: DC
  Administered 2016-10-22: 3 mL via INTRAVENOUS

## 2016-10-22 MED ORDER — SODIUM CHLORIDE 0.9 % IV SOLN
1.0000 ug/kg/min | INTRAVENOUS | Status: DC
  Administered 2016-10-22: 1 ug/kg/min via INTRAVENOUS
  Filled 2016-10-22 (×2): qty 20

## 2016-10-22 MED ORDER — FENTANYL CITRATE (PF) 100 MCG/2ML IJ SOLN: 100.0000 ug | Freq: Once | INTRAMUSCULAR | Status: DC

## 2016-10-22 MED ORDER — SODIUM CHLORIDE 0.9 % IV SOLN
2.0000 mg/h | INTRAVENOUS | Status: DC
  Administered 2016-10-22: 4 mg/h via INTRAVENOUS
  Administered 2016-10-23: 8 mg/h via INTRAVENOUS
  Administered 2016-10-23 – 2016-10-24 (×2): 6 mg/h via INTRAVENOUS
  Filled 2016-10-22 (×7): qty 10

## 2016-10-22 MED ORDER — SODIUM CHLORIDE 0.9% FLUSH
10.0000 mL | INTRAVENOUS | Status: DC | PRN
  Administered 2016-10-26: 20 mL
  Filled 2016-10-22: qty 40

## 2016-10-22 MED ORDER — HEPARIN SODIUM (PORCINE) 5000 UNIT/ML IJ SOLN
5000.0000 [IU] | Freq: Three times a day (TID) | INTRAMUSCULAR | Status: DC
  Administered 2016-10-22 – 2016-10-26 (×15): 5000 [IU] via SUBCUTANEOUS
  Filled 2016-10-22 (×16): qty 1

## 2016-10-22 MED ORDER — FAMOTIDINE IN NACL 20-0.9 MG/50ML-% IV SOLN: 20.0000 mg | Freq: Two times a day (BID) | INTRAVENOUS | Status: DC

## 2016-10-22 MED ORDER — ASPIRIN 81 MG PO CHEW: 324.0000 mg | CHEWABLE_TABLET | ORAL | Status: DC

## 2016-10-22 MED ORDER — SODIUM CHLORIDE 0.9 % IV SOLN: 250.0000 mL | INTRAVENOUS | Status: DC | PRN

## 2016-10-22 MED ORDER — MEPERIDINE HCL 25 MG/ML IJ SOLN
25.0000 mg | INTRAMUSCULAR | Status: AC
  Administered 2016-10-22: 25 mg via INTRAVENOUS
  Filled 2016-10-22: qty 1

## 2016-10-22 MED ORDER — MAGNESIUM SULFATE 2 GM/50ML IV SOLN
2.0000 g | Freq: Once | INTRAVENOUS | Status: AC
  Administered 2016-10-22: 2 g via INTRAVENOUS
  Filled 2016-10-22: qty 50

## 2016-10-22 MED ORDER — FENTANYL BOLUS VIA INFUSION
50.0000 ug | INTRAVENOUS | Status: DC | PRN
  Filled 2016-10-22: qty 50

## 2016-10-22 MED ORDER — SODIUM BICARBONATE 8.4 % IV SOLN
INTRAVENOUS | Status: DC
  Administered 2016-10-22 – 2016-10-23 (×2): via INTRAVENOUS
  Filled 2016-10-22 (×5): qty 100

## 2016-10-22 MED ORDER — DEXTROSE 5 % IV SOLN
2.0000 g | Freq: Every day | INTRAVENOUS | Status: AC
  Administered 2016-10-22 – 2016-10-28 (×7): 2 g via INTRAVENOUS
  Filled 2016-10-22 (×7): qty 2

## 2016-10-22 MED ORDER — LORAZEPAM 2 MG/ML IJ SOLN
INTRAMUSCULAR | Status: AC
  Administered 2016-10-22: 4 mg
  Filled 2016-10-22: qty 2

## 2016-10-22 MED ORDER — HEPARIN SODIUM (PORCINE) 1000 UNIT/ML IJ SOLN
INTRAMUSCULAR | Status: AC
  Filled 2016-10-22: qty 1

## 2016-10-22 MED ORDER — LIDOCAINE HCL (PF) 1 % IJ SOLN
INTRAMUSCULAR | Status: DC | PRN
Start: 2016-10-22 — End: 2016-10-22
  Administered 2016-10-22: 2 mL via INTRADERMAL

## 2016-10-22 MED ORDER — SODIUM CHLORIDE 0.9% FLUSH
10.0000 mL | Freq: Two times a day (BID) | INTRAVENOUS | Status: DC
  Administered 2016-10-22 – 2016-10-24 (×7): 10 mL
  Administered 2016-10-25: 20 mL
  Administered 2016-10-25 – 2016-10-27 (×3): 10 mL
  Administered 2016-10-27: 20 mL
  Administered 2016-10-28 – 2016-10-29 (×2): 10 mL

## 2016-10-22 MED ORDER — SODIUM CHLORIDE 0.9 % IV SOLN
1000.0000 mg | Freq: Two times a day (BID) | INTRAVENOUS | Status: DC
  Administered 2016-10-22 – 2016-10-28 (×13): 1000 mg via INTRAVENOUS
  Filled 2016-10-22 (×14): qty 10

## 2016-10-22 MED ORDER — MIDAZOLAM HCL 2 MG/2ML IJ SOLN: 2.0000 mg | Freq: Once | INTRAMUSCULAR | Status: DC

## 2016-10-22 MED ORDER — POTASSIUM CHLORIDE 2 MEQ/ML IV SOLN
30.0000 meq | Freq: Once | INTRAVENOUS | Status: AC
  Administered 2016-10-22: 30 meq via INTRAVENOUS
  Filled 2016-10-22: qty 15

## 2016-10-22 MED ORDER — ARTIFICIAL TEARS OP OINT
1.0000 "application " | TOPICAL_OINTMENT | Freq: Three times a day (TID) | OPHTHALMIC | Status: DC
  Administered 2016-10-22 – 2016-10-23 (×5): 1 via OPHTHALMIC
  Filled 2016-10-22: qty 3.5

## 2016-10-22 MED ORDER — LORAZEPAM 2 MG/ML IJ SOLN
4.0000 mg | Freq: Once | INTRAMUSCULAR | Status: AC
  Administered 2016-10-22: 4 mg via INTRAVENOUS

## 2016-10-22 MED ORDER — HEPARIN (PORCINE) IN NACL 2-0.9 UNIT/ML-% IJ SOLN
INTRAMUSCULAR | Status: AC
Start: 2016-10-22 — End: 2016-10-22
  Filled 2016-10-22: qty 1000

## 2016-10-22 MED ORDER — LIDOCAINE HCL (PF) 1 % IJ SOLN
INTRAMUSCULAR | Status: AC
  Filled 2016-10-22: qty 30

## 2016-10-22 MED ORDER — ENOXAPARIN SODIUM 40 MG/0.4ML ~~LOC~~ SOLN
40.0000 mg | SUBCUTANEOUS | Status: DC
  Filled 2016-10-22: qty 0.4

## 2016-10-22 MED ORDER — LORAZEPAM 2 MG/ML IJ SOLN: 4.0000 mg | Freq: Once | INTRAMUSCULAR | Status: AC

## 2016-10-22 MED ORDER — FAMOTIDINE IN NACL 20-0.9 MG/50ML-% IV SOLN
20.0000 mg | Freq: Two times a day (BID) | INTRAVENOUS | Status: DC
  Administered 2016-10-22 – 2016-10-27 (×11): 20 mg via INTRAVENOUS
  Filled 2016-10-22 (×11): qty 50

## 2016-10-22 MED ORDER — THIAMINE HCL 100 MG/ML IJ SOLN
Freq: Once | INTRAVENOUS | Status: AC
  Administered 2016-10-22: 03:00:00 via INTRAVENOUS
  Filled 2016-10-22: qty 1000

## 2016-10-22 MED ORDER — CHLORHEXIDINE GLUCONATE 0.12% ORAL RINSE (MEDLINE KIT)
15.0000 mL | Freq: Two times a day (BID) | OROMUCOSAL | Status: DC
  Administered 2016-10-22 – 2016-11-01 (×18): 15 mL via OROMUCOSAL

## 2016-10-22 MED ORDER — HEPARIN (PORCINE) IN NACL 2-0.9 UNIT/ML-% IJ SOLN
INTRAMUSCULAR | Status: DC | PRN
Start: 2016-10-22 — End: 2016-10-22
  Administered 2016-10-22: 1500 mL

## 2016-10-22 MED ORDER — SODIUM CHLORIDE 0.9% FLUSH: 3.0000 mL | INTRAVENOUS | Status: DC | PRN

## 2016-10-22 MED ORDER — MIDAZOLAM BOLUS VIA INFUSION
2.0000 mg | INTRAVENOUS | Status: DC | PRN
  Filled 2016-10-22: qty 2

## 2016-10-22 MED ORDER — IOPAMIDOL (ISOVUE-370) INJECTION 76%
INTRAVENOUS | Status: AC
  Filled 2016-10-22: qty 100

## 2016-10-22 MED ORDER — CISATRACURIUM BOLUS VIA INFUSION
0.0500 mg/kg | INTRAVENOUS | Status: DC | PRN
  Filled 2016-10-22: qty 4

## 2016-10-22 MED ORDER — ORAL CARE MOUTH RINSE
15.0000 mL | OROMUCOSAL | Status: DC
  Administered 2016-10-22 – 2016-10-24 (×25): 15 mL via OROMUCOSAL

## 2016-10-22 MED ORDER — SODIUM CHLORIDE 0.9 % IV SOLN
INTRAVENOUS | Status: DC
Start: 2016-10-22 — End: 2016-10-22

## 2016-10-22 MED ORDER — DEXTROSE 5 % IV SOLN
0.0000 ug/min | INTRAVENOUS | Status: DC
  Administered 2016-10-22: 3 ug/min via INTRAVENOUS
  Administered 2016-10-23: 2 ug/min via INTRAVENOUS
  Filled 2016-10-22 (×2): qty 4

## 2016-10-22 MED ORDER — FENTANYL 2500MCG IN NS 250ML (10MCG/ML) PREMIX INFUSION
100.0000 ug/h | INTRAVENOUS | Status: DC
  Administered 2016-10-22: 250 ug/h via INTRAVENOUS
  Administered 2016-10-22: 100 ug/h via INTRAVENOUS
  Administered 2016-10-22: 250 ug/h via INTRAVENOUS
  Administered 2016-10-23: 300 ug/h via INTRAVENOUS
  Administered 2016-10-23: 250 ug/h via INTRAVENOUS
  Administered 2016-10-24: 200 ug/h via INTRAVENOUS
  Filled 2016-10-22 (×5): qty 250

## 2016-10-22 MED ORDER — SODIUM CHLORIDE 0.9 % IV SOLN
250.0000 mL | INTRAVENOUS | Status: DC | PRN
  Administered 2016-10-22: 250 mL via INTRAVENOUS

## 2016-10-22 MED ORDER — HEPARIN SODIUM (PORCINE) 1000 UNIT/ML IJ SOLN
INTRAMUSCULAR | Status: DC | PRN
Start: 2016-10-22 — End: 2016-10-22
  Administered 2016-10-22: 2000 [IU] via INTRAVENOUS

## 2016-10-22 MED ORDER — PYRIDOXINE HCL 100 MG/ML IJ SOLN
100.0000 mg | Freq: Every day | INTRAMUSCULAR | Status: DC
  Administered 2016-10-22 – 2016-10-26 (×5): 100 mg via INTRAVENOUS
  Filled 2016-10-22 (×5): qty 1

## 2016-10-22 MED ORDER — ASPIRIN 300 MG RE SUPP
300.0000 mg | RECTAL | Status: DC
  Filled 2016-10-22: qty 1

## 2016-10-22 MED ORDER — IOPAMIDOL (ISOVUE-370) INJECTION 76%
INTRAVENOUS | Status: DC | PRN
  Administered 2016-10-22: 40 mL via INTRA_ARTERIAL

## 2016-10-22 MED ORDER — NITROGLYCERIN 1 MG/10 ML FOR IR/CATH LAB
INTRA_ARTERIAL | Status: AC
  Filled 2016-10-22: qty 10

## 2016-10-22 NOTE — Consult Note (Signed)
Rachel Duncan is an 35 y.o. female.   Chief Complaint: C. Arrest HPI: Rachel Duncan  is a 35 y.o. female  With C. Arrest history obtained by chart review and ED staff. Patient was at home, had seizure and fell to floor.  FD responded and placed AED and she was shocked x3.  Upon EMS arrival, patient was placed on cardiac monitor, was found to be in V-Fib and shocked x 2.  Patient was given one IV Epi and regained ROSC.   History reviewed. No pertinent past medical history.  No family members available  History reviewed. No pertinent surgical history.  Could not be obtained  No family history on file. Could not be obtained Social History:  has no tobacco, alcohol, and drug history on file.  Not obtainable  Allergies: No Known Allergies  Review of Systems -  patient intubated and unresponsive Blood pressure 158/98, pulse 69, temperature 97.9 F (36.6 C), resp. rate 18, height 5' 5" (1.651 m), SpO2 100 %. General appearance: Intubated and unresponsive. Eyes: Equal pupils Neck: no adenopathy, no carotid bruit, no JVD, supple, symmetrical, trachea midline and thyroid not enlarged, symmetric, no tenderness/mass/nodules Neck: JVPcould not be made out, carotids 2+= without bruits Resp: clear to auscultation bilaterally Cardio: regular rate and rhythm, S1, S2 normal, no murmur, click, rub or gallop and Tachycardia present, distant heart sounds GI: soft, non-tender; bowel sounds normal; no masses,  no organomegaly Extremities: extremities normal, atraumatic, no cyanosis or edema Pulses: 2+ and symmetric Equal pulses in the upper extremities also Skin: Skin color, texture, turgor normal. No rashes or lesions Neurologic: Patient is comatose and on ventilator  Results for orders placed or performed during the hospital encounter of 10/21/16 (from the past 48 hour(s))  Comprehensive metabolic panel     Status: Abnormal   Collection Time: 10/21/16 10:47 PM  Result Value Ref Range   Sodium 133 (L)  135 - 145 mmol/L   Potassium 3.5 3.5 - 5.1 mmol/L   Chloride 102 101 - 111 mmol/L   CO2 17 (L) 22 - 32 mmol/L   Glucose, Bld 233 (H) 65 - 99 mg/dL   BUN 12 6 - 20 mg/dL   Creatinine, Ser 1.26 (H) 0.44 - 1.00 mg/dL   Calcium 8.0 (L) 8.9 - 10.3 mg/dL   Total Protein 6.8 6.5 - 8.1 g/dL   Albumin 3.4 (L) 3.5 - 5.0 g/dL   AST 732 (H) 15 - 41 U/L   ALT 679 (H) 14 - 54 U/L   Alkaline Phosphatase 91 38 - 126 U/L   Total Bilirubin 0.1 (L) 0.3 - 1.2 mg/dL   GFR calc non Af Amer 55 (L) >60 mL/min   GFR calc Af Amer >60 >60 mL/min    Comment: (NOTE) The eGFR has been calculated using the CKD EPI equation. This calculation has not been validated in all clinical situations. eGFR's persistently <60 mL/min signify possible Chronic Kidney Disease.    Anion gap 14 5 - 15  CBC     Status: Abnormal   Collection Time: 10/21/16 10:47 PM  Result Value Ref Range   WBC 14.5 (H) 4.0 - 10.5 K/uL   RBC 4.41 3.87 - 5.11 MIL/uL   Hemoglobin 13.0 12.0 - 15.0 g/dL   HCT 40.7 36.0 - 46.0 %   MCV 92.3 78.0 - 100.0 fL   MCH 29.5 26.0 - 34.0 pg   MCHC 31.9 30.0 - 36.0 g/dL   RDW 13.6 11.5 - 15.5 %   Platelets 239  150 - 400 K/uL  Magnesium     Status: None   Collection Time: 10/21/16 10:47 PM  Result Value Ref Range   Magnesium 2.2 1.7 - 2.4 mg/dL  I-stat troponin, ED (not at [facility], [facility])     Status: None   Collection Time: 10/21/16 11:04 PM  Result Value Ref Range   Troponin i, poc 0.08 0.00 - 0.08 ng/mL   Comment 3            Comment: Due to the release kinetics of cTnI, a negative result within the first hours of the onset of symptoms does not rule out myocardial infarction with certainty. If myocardial infarction is still suspected, repeat the test at appropriate intervals.   I-Stat Chem 8, ED  (not at [facility], [facility])     Status: Abnormal   Collection Time: 10/21/16 11:06 PM  Result Value Ref Range   Sodium 141 135 - 145 mmol/L   Potassium 3.6 3.5 - 5.1 mmol/L   Chloride 105 101 - 111 mmol/L   BUN  14 6 - 20 mg/dL   Creatinine, Ser 1.10 (H) 0.44 - 1.00 mg/dL   Glucose, Bld 229 (H) 65 - 99 mg/dL   Calcium, Ion 1.15 1.15 - 1.40 mmol/L   TCO2 19 0 - 100 mmol/L   Hemoglobin 13.9 12.0 - 15.0 g/dL   HCT 41.0 36.0 - 46.0 %  Ethanol     Status: Abnormal   Collection Time: 10/21/16 11:06 PM  Result Value Ref Range   Alcohol, Ethyl (B) 6 (H) <5 mg/dL    Comment:        LOWEST DETECTABLE LIMIT FOR SERUM ALCOHOL IS 5 mg/dL FOR MEDICAL PURPOSES ONLY   I-Stat CG4 Lactic Acid, ED     Status: Abnormal   Collection Time: 10/21/16 11:06 PM  Result Value Ref Range   Lactic Acid, Venous 8.50 (HH) 0.5 - 1.9 mmol/L   Comment NOTIFIED PHYSICIAN   I-Stat Beta hCG blood, ED ([facility], [facility], [facility] only)     Status: None   Collection Time: 10/21/16 11:10 PM  Result Value Ref Range   I-stat hCG, quantitative <5.0 <5 mIU/mL   Comment 3            Comment:   GEST. AGE      CONC.  (mIU/mL)   <=1 WEEK        5 - 50     2 WEEKS       50 - 500     3 WEEKS       100 - 10,000     4 WEEKS     1,000 - 30,000        FEMALE AND NON-PREGNANT FEMALE:     LESS THAN 5 mIU/mL    Dg Chest Portable 1 View  Result Date: 10/21/2016 CLINICAL DATA:  Intubated EXAM: PORTABLE CHEST 1 VIEW COMPARISON:  10/21/2016 FINDINGS: Endotracheal tube tip has been repositioned and is now proximally 2 cm superior to the carina. Esophageal tube tip is below the diaphragm but is not included. There is persistent cardiomegaly. There is no significant interval change in ground-glass density throughout the left thorax and within the right upper lobe. No pneumothorax. IMPRESSION: 1. Endotracheal tube tip is at the lower margin of the clavicles 2. Cardiomegaly 3. No change in diffuse ground-glass density of the left thorax and within the right upper lobe. Electronically Signed   By: Donavan Foil M.D.   On: 10/21/2016 23:45   Dg Chest  Portable 1 View  Result Date: 10/21/2016 CLINICAL DATA:  ETT placement EXAM: PORTABLE CHEST 1 VIEW COMPARISON:  None.  FINDINGS: Endotracheal tube tip encroaches right main stem bronchus orifice. Esophageal tube tip is below the diaphragm but is not included on the image. Pacer patch is obscure the lower chest. There is cardiomegaly. There is diffuse ground-glass density throughout the left hemi thorax. There is no pneumothorax. IMPRESSION: 1. Endotracheal tube tip in crit she is right mainstem bronchus orifice 2. Cardiomegaly 3. Diffuse ground-glass density in the left thorax could relate to edema, hemorrhage, or pneumonia. Mild similar appearance of ground-glass density in the right upper lobe. Electronically Signed   By: Donavan Foil M.D.   On: 10/21/2016 23:44    Labs:   Lab Results  Component Value Date   WBC 14.5 (H) 10/21/2016   HGB 13.9 10/21/2016   HCT 41.0 10/21/2016   MCV 92.3 10/21/2016   PLT 239 10/21/2016    Recent Labs Lab 10/21/16 2247 10/21/16 2306  NA 133* 141  K 3.5 3.6  CL 102 105  CO2 17*  --   BUN 12 14  CREATININE 1.26* 1.10*  CALCIUM 8.0*  --   PROT 6.8  --   BILITOT 0.1*  --   ALKPHOS 91  --   ALT 679*  --   AST 732*  --   GLUCOSE 233* 229*    EKG: Diffuse ST depressions, sinus tachycardia and frequent PVCs evident on telemetry. Prolonged QT interval.  Current Facility-Administered Medications:  .  [MAR Hold] etomidate (AMIDATE) injection, , Intravenous, PRN, Merrily Pew, MD, 30 mg at 10/21/16 2252 .  fentaNYL 2520mg in NS 2565m(1026mml) infusion-PREMIX, 30 mcg/hr, Intravenous, Continuous, JasMerrily PewD, Last Rate: 3 mL/hr at 10/21/16 2330, 30 mcg/hr at 10/21/16 2330 .  [MAR Hold] midazolam (VERSED) injection 1 mg, 1 mg, Intravenous, Q15 min PRN, JasMerrily PewD, 1 mg at 10/21/16 2330 .  [MAR Hold] rocuronium (ZEMURON) injection, , Intravenous, PRN, JasMerrily PewD, 100 mg at 10/21/16 2253    Assessment/Plan Patient with cardiac arrest, found to be in VF and shockable rhythm. Abnormal EKG with prolonged QT and frequent PVCs and ventricle couplets on  telemetry, proceed with emergent cardiac catheterization to exclude cardiac etiology. If cardiac etiologies excluded, she will have had seizure leading to severe hypoxemia leading to cardiac arrest. No family members available. CT scan of the head grossly normal without hemorrhage.  GANAdrian ProwsD 10/22/2016, 12:11 AM [facility]. [place]ometown[place]: [phone] Office: [phone] no answer: Cell:  [phone]

## 2016-10-22 NOTE — Procedures (Signed)
Arterial Catheter Insertion Procedure Note Rachel Duncan [id number] [birth year]  Procedure: Insertion of Arterial Catheter  Indications: Blood pressure monitoring  Procedure Details Consent: Unable to obtain consent because of emergent medical necessity. Time Out: Verified patient identification, verified procedure, site/side was marked, verified correct patient position, special equipment/implants available, medications/allergies/relevent history reviewed, required imaging and test results available.  Performed  Maximum sterile technique was used including antiseptics, cap, gloves, gown, hand hygiene, mask and sheet. Skin prep: Chlorhexidine; local anesthetic administered 22 gauge catheter was inserted into left radial artery using the Seldinger technique.  Evaluation Blood flow good; BP tracing good. Complications: No apparent complications.   Rachel Duncan 10/22/2016

## 2016-10-22 NOTE — Progress Notes (Addendum)
PULMONARY / CRITICAL CARE MEDICINE   Name: Rachel Duncan MRN: [id number] DOB: [birth year]    ADMISSION DATE:  10/21/2016 CONSULTATION DATE:  10/21/16  REFERRING MD:  ED physician  CHIEF COMPLAINT:  V-fib arrest  HISTORY OF PRESENT ILLNESS:   Rachel Duncan is a 35 y/o woman with history of previous alcohol abuse who was brought to [facility] via EMS after a cardiac arrest at home.  Her mother reports that Rachel Duncan was sitting and looking at her phone after finishing dinner.  She suddenly fell and arched back appearing stiff.  The family called 70 and CPR was started by her brother until EMS arrived.  When EMS arrived she was found to be in V-fib.  She received a total of 6 shocks and 1 round of epinephrine before return of spontaneous circulation.  In the ED it was noted that her oropharynx was full of debris. Per ED report she was not following commands prior to intubation.  From the ED she was taken to the cath lab where she was found to have no significant coronary artery disease and a preserved EF.  She was then transferred to the ICU.  Upon my arrival to the ICU she appeared to be actively seizing.  She was given a total of 8mg  ativan with resolution of seizure activity.   SUBJECTIVE:  (-) recurrence of seizure this am.  Doing TTM  VITAL SIGNS: BP (!) 130/101   Pulse 75   Temp (!) 90.1 F (32.3 C) (Core (Comment))   Resp 20   Ht 5\' 2"  (1.575 m)   Wt 76.6 kg (168 lb 14 oz)   LMP  (LMP Unknown)   SpO2 93%   BMI 30.89 kg/m   HEMODYNAMICS:    VENTILATOR SETTINGS: Vent Mode: PRVC FiO2 (%):  [40 %-100 %] 40 % Set Rate:  [14 bmp-20 bmp] 20 bmp Vt Set:  [400 mL-460 mL] 400 mL PEEP:  [5 cmH20] 5 cmH20 Plateau Pressure:  [12 cmH20-21 cmH20] 12 cmH20  INTAKE / OUTPUT: I/O last 3 completed shifts: In: 543.2 [I.V.:168.2; IV Piggyback:375] Out: 950 [Urine:700; Emesis/NG output:250]  PHYSICAL EXAMINATION: General:  Laying in bed, intubated sedated. paralyzed Neuro:  Sedated HEENT:   Pupils pinpoint, ETT in place, bruise on left lower inner lip. (+) cervical collar in place Cardiovascular:  Tachycardic, no MRG Lungs:  CTAB, no WRR Abdomen:  Obese, non-distended, minimal bowel sounds, no palpable HSM Musculoskeletal:  No edema, no joint abnormalities Skin:  Bruise on forehead, no rashes or other lesions.   LABS:  BMET  Recent Labs Lab 10/21/16 2247  10/22/16 0200 10/22/16 0427 10/22/16 0603  NA 133*  < > 138 144 143  K 3.5  < > 3.3* 4.0 4.2  CL 102  < > 111 113* 114*  CO2 17*  --  17*  --   --   BUN 12  < > 11 12 11   CREATININE 1.26*  < > 1.00 0.70 0.60  GLUCOSE 233*  < > 240* 165* 167*  < > = values in this interval not displayed.  Electrolytes  Recent Labs Lab 10/21/16 2247 10/22/16 0200 10/22/16 0400  CALCIUM 8.0* 7.5*  --   MG 2.2  --  1.7  PHOS  --   --  2.8    CBC  Recent Labs Lab 10/21/16 2247  10/22/16 0400 10/22/16 0427 10/22/16 0603  WBC 14.5*  --  12.3*  --   --   HGB 13.0  < > 12.1 12.6  11.6*  HCT 40.7  < > 37.0 37.0 34.0*  PLT 239  --  238  --   --   < > = values in this interval not displayed.  Coag's  Recent Labs Lab 10/22/16 0200  APTT 21*  INR 1.28    Sepsis Markers  Recent Labs Lab 10/21/16 2306 10/22/16 0200  LATICACIDVEN 8.50* 4.5*    ABG  Recent Labs Lab 10/22/16 0118 10/22/16 0423 10/22/16 0600  PHART 7.248* 7.195* 7.310*  PCO2ART 38.8 42.3 25.1*  PO2ART 171* 146* 188.0*    Liver Enzymes  Recent Labs Lab 10/21/16 2247  AST 732*  ALT 679*  ALKPHOS 91  BILITOT 0.1*  ALBUMIN 3.4*    Cardiac Enzymes  Recent Labs Lab 10/22/16 0200  TROPONINI 0.88*    Glucose  Recent Labs Lab 10/22/16 0308 10/22/16 0424 10/22/16 0603  GLUCAP 173* 161* 156*    Imaging Ct Head Wo Contrast  Result Date: 10/22/2016 CLINICAL DATA:  Post CPR evaluate for bleed EXAM: CT HEAD WITHOUT CONTRAST TECHNIQUE: Contiguous axial images were obtained from the base of the skull through the vertex  without intravenous contrast. COMPARISON:  None. FINDINGS: Brain: No evidence of acute infarction, hemorrhage, hydrocephalus, extra-axial collection or mass lesion/mass effect. Vascular: No hyperdense vessel or unexpected calcification. Skull: Normal. Negative for fracture or focal lesion. Sinuses/Orbits: Mucosal thickening in the sphenoid and ethmoid sinuses. No acute orbital abnormality. Other: None IMPRESSION: No CT evidence for acute intracranial abnormality. Electronically Signed   By: Donavan Foil M.D.   On: 10/22/2016 00:15   Dg Chest Port 1 View  Result Date: 10/22/2016 CLINICAL DATA:  Central line and nasogastric tube placement. Initial encounter. EXAM: PORTABLE CHEST 1 VIEW COMPARISON:  Chest radiograph performed 10/21/2016 FINDINGS: The patient's endotracheal tube is seen ending 3 cm above the carina. A right subclavian line is noted ending overlying the right atrium. This could be retracted 2-3 cm. An enteric tube is noted coiling within the stomach. The lungs are mildly hypoexpanded but appear grossly clear. No pleural effusion or pneumothorax is seen. The cardiomediastinal silhouette is mildly enlarged. No acute osseous abnormalities are identified. External pacing pads are noted. IMPRESSION: 1. Endotracheal tube seen ending 3 cm above the carina. 2. Right subclavian line noted ending overlying the right atrium. This could be retracted 2-3 cm. 3. Lungs mildly hypoexpanded but grossly clear. 4. Mild cardiomegaly. These results were called by telephone at the time of interpretation on 10/22/2016 at 2:49 am to [place] on [facility], who verbally acknowledged these results. Electronically Signed   By: Garald Balding M.D.   On: 10/22/2016 02:51   Dg Chest Portable 1 View  Result Date: 10/21/2016 CLINICAL DATA:  Intubated EXAM: PORTABLE CHEST 1 VIEW COMPARISON:  10/21/2016 FINDINGS: Endotracheal tube tip has been repositioned and is now proximally 2 cm superior to the carina. Esophageal tube tip is  below the diaphragm but is not included. There is persistent cardiomegaly. There is no significant interval change in ground-glass density throughout the left thorax and within the right upper lobe. No pneumothorax. IMPRESSION: 1. Endotracheal tube tip is at the lower margin of the clavicles 2. Cardiomegaly 3. No change in diffuse ground-glass density of the left thorax and within the right upper lobe. Electronically Signed   By: Donavan Foil M.D.   On: 10/21/2016 23:45   Dg Chest Portable 1 View  Result Date: 10/21/2016 CLINICAL DATA:  ETT placement EXAM: PORTABLE CHEST 1 VIEW COMPARISON:  None. FINDINGS: Endotracheal tube tip encroaches  right main stem bronchus orifice. Esophageal tube tip is below the diaphragm but is not included on the image. Pacer patch is obscure the lower chest. There is cardiomegaly. There is diffuse ground-glass density throughout the left hemi thorax. There is no pneumothorax. IMPRESSION: 1. Endotracheal tube tip in crit she is right mainstem bronchus orifice 2. Cardiomegaly 3. Diffuse ground-glass density in the left thorax could relate to edema, hemorrhage, or pneumonia. Mild similar appearance of ground-glass density in the right upper lobe. Electronically Signed   By: Donavan Foil M.D.   On: 10/21/2016 23:44     STUDIES:  Head CT 10/21/16 - no acute findings Cardiac Cath 10/21/16 - clean coronaries, preserved EF.   CULTURES: Urine 12/3 > Blood 12/3 > MRSA 12/3 > Trache 12/3 >   ANTIBIOTICS: Rocephin 12/3 >    SIGNIFICANT EVENTS: 12/2 admit Vfib arrest with sze. N coronaries. TTM.   LINES/TUBES: ETT 10/21/16 Right subclavian TLC 10/21/16  DISCUSSION: 35 y/o woman with out of hospital v-fib arrest and likely aspiration, possibly due to seizure activity  ASSESSMENT / PLAN:  PULMONARY A: Acute hypoxemic resp failure 2/2 unable to protect airway and asp pna P:   Lung protective ventilation Cont vent support. No weaning while on TTM  VAP  prevention  CARDIOVASCULAR A:  V-fib arrest. S/P LHC 12/3 with N coronaries and N EF P:  Cooling 62F protocol. Cont TTM Cardiology following  RENAL A:   Metabolic acidosis P:   Bicarbonate seems to be trending down.  Will switch IVF to bicarbonate drip.  an   ENDOCRINE A:   Risk of hyperglycemia   P:   Monitor blood sugar on chemistries   NEUROLOGIC A:   Seizure, post arrest Concern for Anoxic ischemic encephalopathy 2/2 arrest H/o ETOH use and last drink was Thanksgiving.  ETOH level on admission was 6. P:   RASS goal: -4 No history of seizure, may be related to alcohol use Neurology following, EEG ordered Cont Keppra Giving daily B6 as pyridoxine deficiency has been reported as a cause of seizure in chronic alcoholics.  Versed/Fentanyl/Nimbex drips   INFECTIOUS A:  Asp PNA Possible UTI P: Cont Rocephin for now.  Panculture Trend lactate   GASTROENTEROLOGY A:  Transaminitis, likely related to arrest and etoh abuse P: Trend LFTs.    PROPHYLAXIS : heparin sq    FAMILY  - Updates: Family updated at bedside by Dr. Corrie Dandy on 12/3.  Spoke to mother and rest of family. Mother makes decision.   - Inter-disciplinary family meet or Palliative Care meeting due by:  10/28/16  Additional critical care time today : 30 minutes.   Monica Becton, MD 10/22/2016, 7:50 AM [facility] Pager [phone] After 3 pm or if no answer, call [phone]    10/22/2016, 7:26 AM

## 2016-10-22 NOTE — Progress Notes (Signed)
Nothing to offer from cardiac standpoint. Echo ordered. Will signoff. Call if questions

## 2016-10-22 NOTE — Progress Notes (Signed)
[place] Progress Note Patient Name: Rachel Duncan DOB: 1981-10-17 MRN: [id number]   Date of Service  10/22/2016  HPI/Events of Note  Subclavian central venous catheter overlying the right atrium. Labs reviewed showing mild hypokalemia 3.3. Lactic acid elevated at 4.5.   eICU Interventions  1. Continuing to trend lactic acid every 6 hours 2. Approval for use of central venous catheter 3. Plan to reposition/withdrawal central venous catheter 2-3 centimeter once intensivist available      Intervention Category Intermediate Interventions: Electrolyte abnormality - evaluation and management;Diagnostic test evaluation  Tera Partridge 10/22/2016, 3:11 AM

## 2016-10-22 NOTE — Procedures (Signed)
Central Venous Catheter Insertion Procedure Note Rachel Duncan [id number] 01-Oct-1981  Procedure: Insertion of Central Venous Catheter Indications: Assessment of intravascular volume, Drug and/or fluid administration and Frequent blood sampling  Procedure Details Consent: Risks of procedure as well as the alternatives and risks of each were explained to the (patient/caregiver).  Consent for procedure obtained. Time Out: Verified patient identification, verified procedure, site/side was marked, verified correct patient position, special equipment/implants available, medications/allergies/relevent history reviewed, required imaging and test results available.  Performed  Maximum sterile technique was used including antiseptics, cap, gloves, gown, hand hygiene, mask and sheet. Skin prep: Chlorhexidine; local anesthetic administered A antimicrobial bonded/coated triple lumen catheter was placed in the right subclavian vein using the Seldinger technique.  Evaluation Blood flow good Complications: No apparent complications Patient did tolerate procedure well. Chest X-ray ordered to verify placement.  CXR: pending.  Rachel Duncan. 10/22/2016, 6:49 AM

## 2016-10-22 NOTE — Progress Notes (Signed)
[place] Progress Note Patient Name: Maali Stolze DOB: 27-Apr-1981 MRN: [id number]   Date of Service  10/22/2016  HPI/Events of Note  Low potassium  eICU Interventions  replaced     Intervention Category Minor Interventions: Electrolytes abnormality - evaluation and management  Mauri [place], P 10/22/2016, 7:48 PM

## 2016-10-22 NOTE — H&P (Signed)
PULMONARY / CRITICAL CARE MEDICINE   Name: Rachel Duncan MRN: [id number] DOB: 01-Jul-1981    ADMISSION DATE:  10/21/2016 CONSULTATION DATE:  10/21/16  REFERRING MD:  ED physician  CHIEF COMPLAINT:  V-fib arrest  HISTORY OF PRESENT ILLNESS:   Rachel Duncan is a 35 y/o woman with history of previous alcohol abuse who was brought to [facility] via EMS after a cardiac arrest at home.  Her mother reports that Rachel Duncan was sitting and looking at her phone after finishing dinner.  She suddenly fell and arched back appearing stiff.  The family called 40 and CPR was started by her brother until EMS arrived.  When EMS arrived she was found to be in V-fib.  She received a total of 6 shocks and 1 round of epinephrine before return of spontaneous circulation.  In the ED it was noted that her oropharynx was full of debris. Per ED report she was not following commands prior to intubation.  From the ED she was taken to the cath lab where she was found to have no significant coronary artery disease and a preserved EF.  She was then transferred to the ICU.  Upon my arrival to the ICU she appeared to be actively seizing.  She was given a total of 8mg  ativan with resolution of seizure activity.   PAST MEDICAL HISTORY :  History of alcohol use/abuse.  Last known alcohol consumption was thanksgiving.  PAST SURGICAL HISTORY: 1 ectopic pregnancy No Known Allergies  No current facility-administered medications on file prior to encounter.    No current outpatient prescriptions on file prior to encounter.    FAMILY HISTORY:  No family history of sudden cardiac death or seizure activity.   SOCIAL HISTORY: She  smokes 1-2 cigarettes a day Drinks 1-2 beers/day  REVIEW OF SYSTEMS:   Review of systems could not be obtained as patient in intubated and sedated.   SUBJECTIVE:  35 y/o woman with out of hospital cardiac arrest possibly 2/2 seizure.  VITAL SIGNS: BP (!) 130/101   Pulse 75   Temp (!) 92.8 F (33.8 C)  (Core (Comment))   Resp 20   Ht 5\' 2"  (1.575 m)   Wt 76.6 kg (168 lb 14 oz)   LMP  (LMP Unknown)   SpO2 98%   BMI 30.89 kg/m   HEMODYNAMICS:    VENTILATOR SETTINGS: Vent Mode: PRVC FiO2 (%):  [40 %-100 %] 40 % Set Rate:  [14 bmp-20 bmp] 20 bmp Vt Set:  [400 mL-460 mL] 400 mL PEEP:  [5 cmH20] 5 cmH20 Plateau Pressure:  [12 cmH20-21 cmH20] 12 cmH20  INTAKE / OUTPUT: No intake/output data recorded.  PHYSICAL EXAMINATION: General:  Laying in bed, intubated sedated.  Neuro:  Sedated HEENT:  Pupils pinpoint, ETT in place, bruise on left lower inner lip Cardiovascular:  Tachycardic, no MRG Lungs:  CTAB, no WRR Abdomen:  Obese, non-distended, minimal bowel sounds, no palpable HSM Musculoskeletal:  No edema, no joint abnormalities Skin:  Bruise on forehead, no rashes or other lesions.   LABS:  BMET  Recent Labs Lab 10/21/16 2247 10/21/16 2306 10/22/16 0200  NA 133* 141 138  K 3.5 3.6 3.3*  CL 102 105 111  CO2 17*  --  17*  BUN 12 14 11   CREATININE 1.26* 1.10* 1.00  GLUCOSE 233* 229* 240*    Electrolytes  Recent Labs Lab 10/21/16 2247 10/22/16 0200  CALCIUM 8.0* 7.5*  MG 2.2  --     CBC  Recent Labs Lab  10/21/16 2247 10/21/16 2306 10/22/16 0400  WBC 14.5*  --  12.3*  HGB 13.0 13.9 12.1  HCT 40.7 41.0 37.0  PLT 239  --  238    Coag's  Recent Labs Lab 10/22/16 0200  APTT 21*  INR 1.28    Sepsis Markers  Recent Labs Lab 10/21/16 2306 10/22/16 0200  LATICACIDVEN 8.50* 4.5*    ABG  Recent Labs Lab 10/22/16 0118 10/22/16 0423  PHART 7.248* 7.195*  PCO2ART 38.8 42.3  PO2ART 171* 146*    Liver Enzymes  Recent Labs Lab 10/21/16 2247  AST 732*  ALT 679*  ALKPHOS 91  BILITOT 0.1*  ALBUMIN 3.4*    Cardiac Enzymes  Recent Labs Lab 10/22/16 0200  TROPONINI 0.88*    Glucose  Recent Labs Lab 10/22/16 0308 10/22/16 0424  GLUCAP 173* 161*    Imaging Ct Head Wo Contrast  Result Date: 10/22/2016 CLINICAL DATA:   Post CPR evaluate for bleed EXAM: CT HEAD WITHOUT CONTRAST TECHNIQUE: Contiguous axial images were obtained from the base of the skull through the vertex without intravenous contrast. COMPARISON:  None. FINDINGS: Brain: No evidence of acute infarction, hemorrhage, hydrocephalus, extra-axial collection or mass lesion/mass effect. Vascular: No hyperdense vessel or unexpected calcification. Skull: Normal. Negative for fracture or focal lesion. Sinuses/Orbits: Mucosal thickening in the sphenoid and ethmoid sinuses. No acute orbital abnormality. Other: None IMPRESSION: No CT evidence for acute intracranial abnormality. Electronically Signed   By: Donavan Foil M.D.   On: 10/22/2016 00:15   Dg Chest Port 1 View  Result Date: 10/22/2016 CLINICAL DATA:  Central line and nasogastric tube placement. Initial encounter. EXAM: PORTABLE CHEST 1 VIEW COMPARISON:  Chest radiograph performed 10/21/2016 FINDINGS: The patient's endotracheal tube is seen ending 3 cm above the carina. A right subclavian line is noted ending overlying the right atrium. This could be retracted 2-3 cm. An enteric tube is noted coiling within the stomach. The lungs are mildly hypoexpanded but appear grossly clear. No pleural effusion or pneumothorax is seen. The cardiomediastinal silhouette is mildly enlarged. No acute osseous abnormalities are identified. External pacing pads are noted. IMPRESSION: 1. Endotracheal tube seen ending 3 cm above the carina. 2. Right subclavian line noted ending overlying the right atrium. This could be retracted 2-3 cm. 3. Lungs mildly hypoexpanded but grossly clear. 4. Mild cardiomegaly. These results were called by telephone at the time of interpretation on 10/22/2016 at 2:49 am to [place] on [facility], who verbally acknowledged these results. Electronically Signed   By: Garald Balding M.D.   On: 10/22/2016 02:51   Dg Chest Portable 1 View  Result Date: 10/21/2016 CLINICAL DATA:  Intubated EXAM: PORTABLE CHEST 1 VIEW  COMPARISON:  10/21/2016 FINDINGS: Endotracheal tube tip has been repositioned and is now proximally 2 cm superior to the carina. Esophageal tube tip is below the diaphragm but is not included. There is persistent cardiomegaly. There is no significant interval change in ground-glass density throughout the left thorax and within the right upper lobe. No pneumothorax. IMPRESSION: 1. Endotracheal tube tip is at the lower margin of the clavicles 2. Cardiomegaly 3. No change in diffuse ground-glass density of the left thorax and within the right upper lobe. Electronically Signed   By: Donavan Foil M.D.   On: 10/21/2016 23:45   Dg Chest Portable 1 View  Result Date: 10/21/2016 CLINICAL DATA:  ETT placement EXAM: PORTABLE CHEST 1 VIEW COMPARISON:  None. FINDINGS: Endotracheal tube tip encroaches right main stem bronchus orifice. Esophageal tube tip is  below the diaphragm but is not included on the image. Pacer patch is obscure the lower chest. There is cardiomegaly. There is diffuse ground-glass density throughout the left hemi thorax. There is no pneumothorax. IMPRESSION: 1. Endotracheal tube tip in crit she is right mainstem bronchus orifice 2. Cardiomegaly 3. Diffuse ground-glass density in the left thorax could relate to edema, hemorrhage, or pneumonia. Mild similar appearance of ground-glass density in the right upper lobe. Electronically Signed   By: Donavan Foil M.D.   On: 10/21/2016 23:44     STUDIES:  Head CT 10/21/16 - no acute findings Cardiac Cath 10/21/16 - clean coronaries, preserved EF.   CULTURES:   ANTIBIOTICS:   SIGNIFICANT EVENTS:   LINES/TUBES: ETT 10/21/16 Right subclavian TLC 10/21/16  DISCUSSION: 35 y/o woman with out of hospital v-fib arrest and likely aspiration, possibly due to seizure activity  ASSESSMENT / PLAN:  PULMONARY A: Likely aspiration, intubated for AMS P:   Lung protective ventilation VAP prevention  CARDIOVASCULAR A:  V-fib arrest P:  Clean  coronaries Cooling 86F protocol  RENAL A:   Metabolic acidosis P:   2/2 hypoperfusion and seizure -improving   ENDOCRINE A:   Risk of hyperglycemia   P:   Monitor blood sugar on chemistries  NEUROLOGIC A:   Seizure P:   RASS goal: -4 No history of seizure, may be related to alcohol use Neurology following, EEG ordered Started on Keppra Giving daily B6 as pyridoxine deficiency has been reported as a cause of seizure in chronic alcoholics.    FAMILY  - Updates: Family updated at bedside  - Inter-disciplinary family meet or Palliative Care meeting due by:  10/28/16   [name], MD, PhD Pulmonary and [place] Pager: [phone]  10/22/2016, 5:15 AM

## 2016-10-22 NOTE — ED Notes (Signed)
To Cath Lab with RN and EMT.

## 2016-10-22 NOTE — Progress Notes (Signed)
  Echocardiogram 2D Echocardiogram has been performed.  Rachel Duncan 10/22/2016, 1:18 PM

## 2016-10-22 NOTE — Progress Notes (Signed)
   10/22/16 0100  Clinical Encounter Type  Visited With Family;Health care provider  Visit Type Initial;Spiritual support;Social support  Referral From Nurse  Consult/Referral To Chaplain  Spiritual Encounters  Spiritual Needs Prayer;Emotional  Stress Factors  Patient Stress Factors Not reviewed  Family Stress Factors Exhausted;Family relationships;Health changes    Chaplain responded to page from [employer] engineer and nurse. Family distraught after patient brought in for code stemi and cath lab. Dr. Christoper Allegra pt had not had heart attack, currently in med induce coma on [place]. Escorted family to [place], listened to family narrative and offered prayer and pastoral presence. Will provide information to day chaplain for follow up.

## 2016-10-22 NOTE — Progress Notes (Signed)
[place] Progress Note Patient Name: Rachel Duncan DOB: [birth year] MRN: [id number]   Date of Service  10/22/2016  HPI/Events of Note  Notified by respiratory therapy of ABG with pH 7.195/PCO2 42/PO2 146 with a ventilator set rate 14. Serum bicarbonate 17. Lactic acid 4.5.   eICU Interventions  1. Increase ventilator rate to 20 2. Repeat ABG at 6 AM 3. Holding off on bicarbonate drip for now anticipating improvement in metabolic acidosis      Intervention Category Major Interventions: Acid-Base disturbance - evaluation and management;Respiratory failure - evaluation and management  Tera Partridge 10/22/2016, 4:44 AM

## 2016-10-22 NOTE — Progress Notes (Signed)
Patient had dinner, then her mother noticed that she dropped a plate and had a seizure. Prior history of alcohol abuse/alcohol excessive use has been abstinent for quite some time. No other medical issues, not on any active on Active medications. Has depo-provera left arm.

## 2016-10-22 NOTE — Consult Note (Signed)
Admission H&P    Chief Complaint: generalized seizure and cardiac arrest.  HPI: Rachel Duncan is an 35 y.o. female with a history of alcohol abuse and reportedly abstinent over the past, brought to the ED following a witnessed generalized seizure and cardiac arrest with V. Fib.She was intubated after arriving in the ED. Focal seizure activity was subsequently noted. She was given a total of 8 mg of Ativan IV and started on Versed and fentanyl IV drips. No further seizure activities been reported. She has no history of seizure disorder. Cooling protocol has been initiated. CT scan of her head showed no acute intracranial abnormality.  Past medical history: Alcohol abuse.  History reviewed. No pertinent surgical history.  No family history on file. Social History:  has no tobacco, alcohol, and drug history on file.  Allergies: No Known Allergies  No prescriptions prior to admission.    ROS: Unavailable due to patient's comatose state.  Physical Examination: Blood pressure (!) 130/101, pulse (!) 112, temperature (!) 95.5 F (35.3 C), temperature source Core (Comment), resp. rate 14, height 5' 2" (1.575 m), weight 76.6 kg (168 lb 14 oz), SpO2 99 %.  HEENT-  Normocephalic, no lesions, without obvious abnormality.  Normal external eye and conjunctiva.  Normal TM's bilaterally.  Normal auditory canals and external ears. Normal external nose, mucus membranes and septum.  Normal pharynx. Neck supple with no masses, nodes, nodules or enlargement. Cardiovascular - regular rate and rhythm, S1, S2 normal, no murmur, click, rub or gallop Lungs - chest clear, no wheezing, rales, normal symmetric air entry Abdomen - soft, non-tender; bowel sounds normal; no masses,  no organomegaly Extremities - no joint deformities, effusion, or inflammation  Neurologic Examination: Patient was intubated and on mechanical ventilation. No spontaneous respirations were noted. She was unresponsive to external  stimuli, including noxious stimuli. Pupils were small and equal, and reactivity cannot be determined. Extraocular movements are absent with oculocephalic maneuvers. Face was symmetrical with no indication of focal weakness Muscle tone was flaccid throughout. There were no spontaneous movements and no abnormal posturing. Deep tendon reflexes were 2+ and symmetrical. Plantar responses were mute.  Results for orders placed or performed during the hospital encounter of 10/21/16 (from the past 48 hour(s))  Comprehensive metabolic panel     Status: Abnormal   Collection Time: 10/21/16 10:47 PM  Result Value Ref Range   Sodium 133 (L) 135 - 145 mmol/L   Potassium 3.5 3.5 - 5.1 mmol/L   Chloride 102 101 - 111 mmol/L   CO2 17 (L) 22 - 32 mmol/L   Glucose, Bld 233 (H) 65 - 99 mg/dL   BUN 12 6 - 20 mg/dL   Creatinine, Ser 1.26 (H) 0.44 - 1.00 mg/dL   Calcium 8.0 (L) 8.9 - 10.3 mg/dL   Total Protein 6.8 6.5 - 8.1 g/dL   Albumin 3.4 (L) 3.5 - 5.0 g/dL   AST 732 (H) 15 - 41 U/L   ALT 679 (H) 14 - 54 U/L   Alkaline Phosphatase 91 38 - 126 U/L   Total Bilirubin 0.1 (L) 0.3 - 1.2 mg/dL   GFR calc non Af Amer 55 (L) >60 mL/min   GFR calc Af Amer >60 >60 mL/min    Comment: (NOTE) The eGFR has been calculated using the CKD EPI equation. This calculation has not been validated in all clinical situations. eGFR's persistently <60 mL/min signify possible Chronic Kidney Disease.    Anion gap 14 5 - 15  CBC  Status: Abnormal   Collection Time: 10/21/16 10:47 PM  Result Value Ref Range   WBC 14.5 (H) 4.0 - 10.5 K/uL   RBC 4.41 3.87 - 5.11 MIL/uL   Hemoglobin 13.0 12.0 - 15.0 g/dL   HCT 40.7 36.0 - 46.0 %   MCV 92.3 78.0 - 100.0 fL   MCH 29.5 26.0 - 34.0 pg   MCHC 31.9 30.0 - 36.0 g/dL   RDW 13.6 11.5 - 15.5 %   Platelets 239 150 - 400 K/uL  Magnesium     Status: None   Collection Time: 10/21/16 10:47 PM  Result Value Ref Range   Magnesium 2.2 1.7 - 2.4 mg/dL  I-stat troponin, ED (not at  [facility], [facility])     Status: None   Collection Time: 10/21/16 11:04 PM  Result Value Ref Range   Troponin i, poc 0.08 0.00 - 0.08 ng/mL   Comment 3            Comment: Due to the release kinetics of cTnI, a negative result within the first hours of the onset of symptoms does not rule out myocardial infarction with certainty. If myocardial infarction is still suspected, repeat the test at appropriate intervals.   I-Stat Chem 8, ED  (not at [facility], [facility])     Status: Abnormal   Collection Time: 10/21/16 11:06 PM  Result Value Ref Range   Sodium 141 135 - 145 mmol/L   Potassium 3.6 3.5 - 5.1 mmol/L   Chloride 105 101 - 111 mmol/L   BUN 14 6 - 20 mg/dL   Creatinine, Ser 1.10 (H) 0.44 - 1.00 mg/dL   Glucose, Bld 229 (H) 65 - 99 mg/dL   Calcium, Ion 1.15 1.15 - 1.40 mmol/L   TCO2 19 0 - 100 mmol/L   Hemoglobin 13.9 12.0 - 15.0 g/dL   HCT 41.0 36.0 - 46.0 %  Ethanol     Status: Abnormal   Collection Time: 10/21/16 11:06 PM  Result Value Ref Range   Alcohol, Ethyl (B) 6 (H) <5 mg/dL    Comment:        LOWEST DETECTABLE LIMIT FOR SERUM ALCOHOL IS 5 mg/dL FOR MEDICAL PURPOSES ONLY   I-Stat CG4 Lactic Acid, ED     Status: Abnormal   Collection Time: 10/21/16 11:06 PM  Result Value Ref Range   Lactic Acid, Venous 8.50 (HH) 0.5 - 1.9 mmol/L   Comment NOTIFIED PHYSICIAN   I-Stat Beta hCG blood, ED ([facility], [facility], [facility] only)     Status: None   Collection Time: 10/21/16 11:10 PM  Result Value Ref Range   I-stat hCG, quantitative <5.0 <5 mIU/mL   Comment 3            Comment:   GEST. AGE      CONC.  (mIU/mL)   <=1 WEEK        5 - 50     2 WEEKS       50 - 500     3 WEEKS       100 - 10,000     4 WEEKS     1,000 - 30,000        FEMALE AND NON-PREGNANT FEMALE:     LESS THAN 5 mIU/mL   Urinalysis, Routine w reflex microscopic (not at [facility])     Status: Abnormal   Collection Time: 10/21/16 11:38 PM  Result Value Ref Range   Color, Urine YELLOW YELLOW   APPearance CLOUDY (A) CLEAR   Specific Gravity,  Urine 1.016 1.005 - 1.030   pH 6.0 5.0 - 8.0   Glucose, UA 250 (A) NEGATIVE mg/dL   Hgb urine dipstick LARGE (A) NEGATIVE   Bilirubin Urine NEGATIVE NEGATIVE   Ketones, ur NEGATIVE NEGATIVE mg/dL   Protein, ur >300 (A) NEGATIVE mg/dL   Nitrite NEGATIVE NEGATIVE   Leukocytes, UA NEGATIVE NEGATIVE  Rapid urine drug screen (hospital performed)     Status: Abnormal   Collection Time: 10/21/16 11:38 PM  Result Value Ref Range   Opiates NONE DETECTED NONE DETECTED   Cocaine NONE DETECTED NONE DETECTED   Benzodiazepines POSITIVE (A) NONE DETECTED   Amphetamines NONE DETECTED NONE DETECTED   Tetrahydrocannabinol NONE DETECTED NONE DETECTED   Barbiturates NONE DETECTED NONE DETECTED    Comment:        DRUG SCREEN FOR MEDICAL PURPOSES ONLY.  IF CONFIRMATION IS NEEDED FOR ANY PURPOSE, NOTIFY LAB WITHIN 5 DAYS.        LOWEST DETECTABLE LIMITS FOR URINE DRUG SCREEN Drug Class       Cutoff (ng/mL) Amphetamine      1000 Barbiturate      200 Benzodiazepine   720 Tricyclics       947 Opiates          300 Cocaine          300 THC              50   Urine microscopic-add on     Status: Abnormal   Collection Time: 10/21/16 11:38 PM  Result Value Ref Range   Squamous Epithelial / LPF 6-30 (A) NONE SEEN   WBC, UA 0-5 0 - 5 WBC/hpf   RBC / HPF TOO NUMEROUS TO COUNT 0 - 5 RBC/hpf   Bacteria, UA MANY (A) NONE SEEN   Casts GRANULAR CAST (A) NEGATIVE  MRSA PCR Screening     Status: None   Collection Time: 10/22/16  1:05 AM  Result Value Ref Range   MRSA by PCR NEGATIVE NEGATIVE    Comment:        The GeneXpert MRSA Assay (FDA approved for NASAL specimens only), is one component of a comprehensive MRSA colonization surveillance program. It is not intended to diagnose MRSA infection nor to guide or monitor treatment for MRSA infections.   Arterial Blood Gas     Status: Abnormal   Collection Time: 10/22/16  1:18 AM  Result Value Ref Range   FIO2 0.40    Delivery systems VENTILATOR     Mode PRESSURE REGULATED VOLUME CONTROL    VT 400 mL   LHR 14.0 resp/min   Peep/cpap 5.0 cm H20   pH, Arterial 7.248 (L) 7.350 - 7.450   pCO2 arterial 38.8 32.0 - 48.0 mmHg   pO2, Arterial 171 (H) 83.0 - 108.0 mmHg   Bicarbonate 16.3 (L) 20.0 - 28.0 mmol/L   Acid-base deficit 9.5 (H) 0.0 - 2.0 mmol/L   O2 Saturation 98.7 %   Patient temperature 98.6    Collection site LEFT RADIAL    Drawn by [id number]    Sample type ARTERIAL DRAW    Allens test (pass/fail) PASS PASS  Lactic acid, plasma     Status: Abnormal   Collection Time: 10/22/16  2:00 AM  Result Value Ref Range   Lactic Acid, Venous 4.5 (HH) 0.5 - 1.9 mmol/L    Comment: CRITICAL RESULT CALLED TO, READ BACK BY AND VERIFIED WITH: STOWE,S RN 10/22/2016 0256 JORDANS   Troponin I     Status:  Abnormal   Collection Time: 10/22/16  2:00 AM  Result Value Ref Range   Troponin I 0.88 (HH) <0.03 ng/mL    Comment: CRITICAL RESULT CALLED TO, READ BACK BY AND VERIFIED WITH: OKEEFFE,C RN 10/22/2016 0300 JORDANS   Basic metabolic panel     Status: Abnormal   Collection Time: 10/22/16  2:00 AM  Result Value Ref Range   Sodium 138 135 - 145 mmol/L   Potassium 3.3 (L) 3.5 - 5.1 mmol/L   Chloride 111 101 - 111 mmol/L   CO2 17 (L) 22 - 32 mmol/L   Glucose, Bld 240 (H) 65 - 99 mg/dL   BUN 11 6 - 20 mg/dL   Creatinine, Ser 1.00 0.44 - 1.00 mg/dL   Calcium 7.5 (L) 8.9 - 10.3 mg/dL   GFR calc non Af Amer >60 >60 mL/min   GFR calc Af Amer >60 >60 mL/min    Comment: (NOTE) The eGFR has been calculated using the CKD EPI equation. This calculation has not been validated in all clinical situations. eGFR's persistently <60 mL/min signify possible Chronic Kidney Disease.    Anion gap 10 5 - 15  Protime-INR now and repeat in 8 hours     Status: Abnormal   Collection Time: 10/22/16  2:00 AM  Result Value Ref Range   Prothrombin Time 16.1 (H) 11.4 - 15.2 seconds   INR 1.28   APTT now and repeat in 8 hours     Status: Abnormal   Collection Time:  10/22/16  2:00 AM  Result Value Ref Range   aPTT 21 (L) 24 - 36 seconds  Glucose, capillary     Status: Abnormal   Collection Time: 10/22/16  3:08 AM  Result Value Ref Range   Glucose-Capillary 173 (H) 65 - 99 mg/dL   Ct Head Wo Contrast  Result Date: 10/22/2016 CLINICAL DATA:  Post CPR evaluate for bleed EXAM: CT HEAD WITHOUT CONTRAST TECHNIQUE: Contiguous axial images were obtained from the base of the skull through the vertex without intravenous contrast. COMPARISON:  None. FINDINGS: Brain: No evidence of acute infarction, hemorrhage, hydrocephalus, extra-axial collection or mass lesion/mass effect. Vascular: No hyperdense vessel or unexpected calcification. Skull: Normal. Negative for fracture or focal lesion. Sinuses/Orbits: Mucosal thickening in the sphenoid and ethmoid sinuses. No acute orbital abnormality. Other: None IMPRESSION: No CT evidence for acute intracranial abnormality. Electronically Signed   By: Donavan Foil M.D.   On: 10/22/2016 00:15   Dg Chest Port 1 View  Result Date: 10/22/2016 CLINICAL DATA:  Central line and nasogastric tube placement. Initial encounter. EXAM: PORTABLE CHEST 1 VIEW COMPARISON:  Chest radiograph performed 10/21/2016 FINDINGS: The patient's endotracheal tube is seen ending 3 cm above the carina. A right subclavian line is noted ending overlying the right atrium. This could be retracted 2-3 cm. An enteric tube is noted coiling within the stomach. The lungs are mildly hypoexpanded but appear grossly clear. No pleural effusion or pneumothorax is seen. The cardiomediastinal silhouette is mildly enlarged. No acute osseous abnormalities are identified. External pacing pads are noted. IMPRESSION: 1. Endotracheal tube seen ending 3 cm above the carina. 2. Right subclavian line noted ending overlying the right atrium. This could be retracted 2-3 cm. 3. Lungs mildly hypoexpanded but grossly clear. 4. Mild cardiomegaly. These results were called by telephone at the time  of interpretation on 10/22/2016 at 2:49 am to [place] on [facility], who verbally acknowledged these results. Electronically Signed   By: Garald Balding M.D.   On: 10/22/2016  02:51   Dg Chest Portable 1 View  Result Date: 10/21/2016 CLINICAL DATA:  Intubated EXAM: PORTABLE CHEST 1 VIEW COMPARISON:  10/21/2016 FINDINGS: Endotracheal tube tip has been repositioned and is now proximally 2 cm superior to the carina. Esophageal tube tip is below the diaphragm but is not included. There is persistent cardiomegaly. There is no significant interval change in ground-glass density throughout the left thorax and within the right upper lobe. No pneumothorax. IMPRESSION: 1. Endotracheal tube tip is at the lower margin of the clavicles 2. Cardiomegaly 3. No change in diffuse ground-glass density of the left thorax and within the right upper lobe. Electronically Signed   By: Donavan Foil M.D.   On: 10/21/2016 23:45   Dg Chest Portable 1 View  Result Date: 10/21/2016 CLINICAL DATA:  ETT placement EXAM: PORTABLE CHEST 1 VIEW COMPARISON:  None. FINDINGS: Endotracheal tube tip encroaches right main stem bronchus orifice. Esophageal tube tip is below the diaphragm but is not included on the image. Pacer patch is obscure the lower chest. There is cardiomegaly. There is diffuse ground-glass density throughout the left hemi thorax. There is no pneumothorax. IMPRESSION: 1. Endotracheal tube tip in crit she is right mainstem bronchus orifice 2. Cardiomegaly 3. Diffuse ground-glass density in the left thorax could relate to edema, hemorrhage, or pneumonia. Mild similar appearance of ground-glass density in the right upper lobe. Electronically Signed   By: Donavan Foil M.D.   On: 10/21/2016 23:44    Assessment/Plan 35 year old lady presenting with probable hypoxic encephalopathy following a generalized seizure and and probable subsequent cardiac arrest. Etiology for generalized seizure is unclear. Patient reportedly has not been  drinking alcohol past 1 year. Laboratory studies showed markedly elevated ALT and AST levels, however.  Recommendations: 1. Keppra 1000 mg IV every 12 hours 2. MRI of the brain without contrast when feasible 3. EEG to assess severity of encephalopathy as well as to rule possible subclinical seizure activity  We will continue to follow this patient closely with you.  C.R. Nicole Kindred, MD [facility] [phone]  10/22/2016, 4:01 AM

## 2016-10-23 ENCOUNTER — Encounter (HOSPITAL_COMMUNITY): Payer: Self-pay | Admitting: Cardiology

## 2016-10-23 ENCOUNTER — Inpatient Hospital Stay (HOSPITAL_COMMUNITY): Payer: Medicare Other

## 2016-10-23 DIAGNOSIS — J9601 Acute respiratory failure with hypoxia: Secondary | ICD-10-CM

## 2016-10-23 DIAGNOSIS — G9340 Encephalopathy, unspecified: Secondary | ICD-10-CM | POA: Diagnosis present

## 2016-10-23 DIAGNOSIS — I469 Cardiac arrest, cause unspecified: Secondary | ICD-10-CM

## 2016-10-23 LAB — BLOOD GAS, ARTERIAL
ACID-BASE DEFICIT: 5.2 mmol/L — AB (ref 0.0–2.0)
Bicarbonate: 17.6 mmol/L — ABNORMAL LOW (ref 20.0–28.0)
DRAWN BY: 12472
FIO2: 40
MECHVT: 400 mL
O2 SAT: 99.2 %
PATIENT TEMPERATURE: 98.6
PCO2 ART: 23 mmHg — AB (ref 32.0–48.0)
PEEP/CPAP: 5 cmH2O
PH ART: 7.495 — AB (ref 7.350–7.450)
RATE: 20 resp/min
pO2, Arterial: 179 mmHg — ABNORMAL HIGH (ref 83.0–108.0)

## 2016-10-23 LAB — POCT I-STAT, CHEM 8
BUN: 3 mg/dL — ABNORMAL LOW (ref 6–20)
CALCIUM ION: 1.18 mmol/L (ref 1.15–1.40)
Chloride: 111 mmol/L (ref 101–111)
Creatinine, Ser: 0.4 mg/dL — ABNORMAL LOW (ref 0.44–1.00)
GLUCOSE: 110 mg/dL — AB (ref 65–99)
HCT: 30 % — ABNORMAL LOW (ref 36.0–46.0)
HEMOGLOBIN: 10.2 g/dL — AB (ref 12.0–15.0)
POTASSIUM: 3.3 mmol/L — AB (ref 3.5–5.1)
Sodium: 143 mmol/L (ref 135–145)
TCO2: 19 mmol/L (ref 0–100)

## 2016-10-23 LAB — HEPATIC FUNCTION PANEL
ALK PHOS: 42 U/L (ref 38–126)
ALT: 388 U/L — ABNORMAL HIGH (ref 14–54)
AST: 333 U/L — ABNORMAL HIGH (ref 15–41)
Albumin: 2.7 g/dL — ABNORMAL LOW (ref 3.5–5.0)
BILIRUBIN INDIRECT: 0.3 mg/dL (ref 0.3–0.9)
BILIRUBIN TOTAL: 0.4 mg/dL (ref 0.3–1.2)
Bilirubin, Direct: 0.1 mg/dL (ref 0.1–0.5)
TOTAL PROTEIN: 5.3 g/dL — AB (ref 6.5–8.1)

## 2016-10-23 LAB — BASIC METABOLIC PANEL
ANION GAP: 6 (ref 5–15)
ANION GAP: 3 — AB (ref 5–15)
Anion gap: 5 (ref 5–15)
Anion gap: 7 (ref 5–15)
BUN: 6 mg/dL (ref 6–20)
BUN: 6 mg/dL (ref 6–20)
BUN: <5 mg/dL — ABNORMAL LOW (ref 6–20)
BUN: <5 mg/dL — ABNORMAL LOW (ref 6–20)
CALCIUM: 7.9 mg/dL — AB (ref 8.9–10.3)
CHLORIDE: 113 mmol/L — AB (ref 101–111)
CHLORIDE: 114 mmol/L — AB (ref 101–111)
CHLORIDE: 116 mmol/L — AB (ref 101–111)
CHLORIDE: 114 mmol/L — AB (ref 101–111)
CO2: 16 mmol/L — AB (ref 22–32)
CO2: 19 mmol/L — ABNORMAL LOW (ref 22–32)
CO2: 18 mmol/L — ABNORMAL LOW (ref 22–32)
CO2: 18 mmol/L — ABNORMAL LOW (ref 22–32)
CREATININE: 0.51 mg/dL (ref 0.44–1.00)
CREATININE: 0.59 mg/dL (ref 0.44–1.00)
CREATININE: 0.62 mg/dL (ref 0.44–1.00)
Calcium: 8 mg/dL — ABNORMAL LOW (ref 8.9–10.3)
Calcium: 7.8 mg/dL — ABNORMAL LOW (ref 8.9–10.3)
Calcium: 8.1 mg/dL — ABNORMAL LOW (ref 8.9–10.3)
Creatinine, Ser: 0.53 mg/dL (ref 0.44–1.00)
GFR calc Af Amer: >60 mL/min (ref >60)
GFR calc non Af Amer: >60 mL/min (ref >60)
GFR calc non Af Amer: >60 mL/min (ref >60)
GFR calc non Af Amer: >60 mL/min (ref >60)
GFR calc non Af Amer: >60 mL/min (ref >60)
GLUCOSE: 122 mg/dL — AB (ref 65–99)
Glucose, Bld: 110 mg/dL — ABNORMAL HIGH (ref 65–99)
Glucose, Bld: 97 mg/dL (ref 65–99)
Glucose, Bld: 89 mg/dL (ref 65–99)
POTASSIUM: 3.5 mmol/L (ref 3.5–5.1)
POTASSIUM: 3.2 mmol/L — AB (ref 3.5–5.1)
Potassium: 3.2 mmol/L — ABNORMAL LOW (ref 3.5–5.1)
Potassium: 3.6 mmol/L (ref 3.5–5.1)
SODIUM: 136 mmol/L (ref 135–145)
SODIUM: 139 mmol/L (ref 135–145)
SODIUM: 139 mmol/L (ref 135–145)
Sodium: 135 mmol/L (ref 135–145)

## 2016-10-23 LAB — GLUCOSE, CAPILLARY
GLUCOSE-CAPILLARY: 116 mg/dL — AB (ref 65–99)
GLUCOSE-CAPILLARY: 107 mg/dL — AB (ref 65–99)
GLUCOSE-CAPILLARY: 68 mg/dL (ref 65–99)
Glucose-Capillary: 100 mg/dL — ABNORMAL HIGH (ref 65–99)

## 2016-10-23 LAB — CBC
HEMATOCRIT: 28.5 % — AB (ref 36.0–46.0)
HEMOGLOBIN: 9.9 g/dL — AB (ref 12.0–15.0)
MCH: 29.8 pg (ref 26.0–34.0)
MCHC: 34.7 g/dL (ref 30.0–36.0)
MCV: 85.8 fL (ref 78.0–100.0)
Platelets: 167 10*3/uL (ref 150–400)
RBC: 3.32 MIL/uL — ABNORMAL LOW (ref 3.87–5.11)
RDW: 13.1 % (ref 11.5–15.5)
WBC: 2.9 10*3/uL — ABNORMAL LOW (ref 4.0–10.5)

## 2016-10-23 LAB — ECHOCARDIOGRAM COMPLETE
Height: 62 in
Weight: 2701.96 oz

## 2016-10-23 LAB — MAGNESIUM: Magnesium: 2 mg/dL (ref 1.7–2.4)

## 2016-10-23 LAB — PHOSPHORUS: PHOSPHORUS: 1.1 mg/dL — AB (ref 2.5–4.6)

## 2016-10-23 MED ORDER — DEXTROSE 50 % IV SOLN
INTRAVENOUS | Status: AC
  Administered 2016-10-23: 25 mL
  Filled 2016-10-23: qty 50

## 2016-10-23 MED ORDER — POTASSIUM CHLORIDE 20 MEQ/15ML (10%) PO SOLN
40.0000 meq | Freq: Once | ORAL | Status: AC
  Administered 2016-10-23: 40 meq via ORAL
  Filled 2016-10-23: qty 30

## 2016-10-23 MED ORDER — POTASSIUM CHLORIDE CRYS ER 20 MEQ PO TBCR
40.0000 meq | EXTENDED_RELEASE_TABLET | Freq: Once | ORAL | Status: AC
  Administered 2016-10-23: 40 meq via ORAL
  Filled 2016-10-23: qty 2

## 2016-10-23 NOTE — Progress Notes (Signed)
Subjective:  Sedated and intubated  Objective:  Vital Signs in the last 24 hours: Temp:  [90.5 F (32.5 C)-97.3 F (36.3 C)] 97.3 F (36.3 C) (12/04 1700) Pulse Rate:  [33-85] 85 (12/04 1700) Resp:  [20-22] 22 (12/04 1700) BP: (100)/(78) 100/78 (12/03 2000) SpO2:  [100 %] 100 % (12/04 1700) Arterial Line BP: (112-155)/(61-92) 126/66 (12/04 1700) FiO2 (%):  [30 %] 30 % (12/04 1634) Weight:  [80.1 kg (176 lb 9.4 oz)] 80.1 kg (176 lb 9.4 oz) (12/04 0500)  Intake/Output from previous day: 12/03 0701 - 12/04 0700 In: 2839.2 [I.V.:2124.2; NG/GT:30; IV Piggyback:685] Out: [id number] [Urine:1445]  Physical Exam:  General appearance: sedated on vent Eyes: negative findings: lids and lashes normal Neck: Limited exam due to ET tube Neck: JVP - normal, carotids 2+= without bruits Resp: clear to auscultation bilaterally Chest wall: cooling pads on Cardio: regular rate and rhythm, S1, S2 normal, no murmur, click, rub or gallop and distant heart sounds GI: soft, non-tender; bowel sounds normal; no masses,  no organomegaly Extremities: extremities normal, atraumatic, no cyanosis or edema    Lab Results: BMP  Recent Labs  10/23/16 0415 10/23/16 0810 10/23/16 1200 10/23/16 1400  NA 136 143 139 139  K 3.2* 3.3* 3.2* 3.6  CL 114* 111 116* 114*  CO2 19*  --  18* 18*  GLUCOSE 110* 110* 97 89  BUN 6 3* <5* <5*  CREATININE 0.51 0.40* 0.59 0.62  CALCIUM 7.8*  --  8.1* 7.9*  GFRNONAA >60  --  >60 >60  GFRAA >60  --  >60 >60    CBC  Recent Labs Lab 10/23/16 0415 10/23/16 0810  WBC 2.9*  --   RBC 3.32*  --   HGB 9.9* 10.2*  HCT 28.5* 30.0*  PLT 167  --   MCV 85.8  --   MCH 29.8  --   MCHC 34.7  --   RDW 13.1  --     HEMOGLOBIN A1C No results found for: HGBA1C, MPG  Cardiac Panel (last 3 results)  Recent Labs  10/22/16 0832 10/22/16 1445 10/22/16 1800  CKTOTAL 3,820*  --   --   CKMB 81.5*  --   --   TROPONINI 1.19* 0.81* 0.68*  RELINDX 2.1  --   --     BNP (last  3 results) No results for input(s): PROBNP in the last 8760 hours.  TSH No results for input(s): TSH in the last 8760 hours.  CHOLESTEROL No results for input(s): CHOL in the last 8760 hours.  Hepatic Function Panel  Recent Labs  10/21/16 2247 10/22/16 1400 10/23/16 0415  PROT 6.8 5.3* 5.3*  ALBUMIN 3.4* 2.8* 2.7*  AST 732* 793* 333*  ALT 679* 512* 388*  ALKPHOS 91 45 42  BILITOT 0.1* 0.4 0.4  BILIDIR  --  <0.1* 0.1  IBILI  --  NOT CALCULATED 0.3    Imaging: Imaging results have been reviewed  Cardiac Studies:  EKG: Diffuse ST depressions, sinus tachycardia and frequent PVCs evident on telemetry. Prolonged QT interval.  Coronary angiogram 10/21/2016: 1. Normal coronary arteries. 2. Left ventricular systolic function appears to be preserved, EF 50%. Normal LVEDP.  Echocardiogram 10/22/2016: Left ventricle was normal in size, systolic function severely reduced at 25-30% with diffuse hypokinesis.  Mild to moderate tricuspid regurgitation, PA pressure within normal limits.  Right ventricle is normal in size, systolic function moderately reduced.  Recommendation: Patient's presentation may be consistent with probably seizure disorder leading to hypoxemia and A. fib cardiac  arrest.   Noncardiac etiology for her presentation need to be evaluated. The LVEF at presentation although was a hand contrast injection, appeared to be at least 45-50% and now the echo revealing severely reduced LVEF is probably post defibrillation, CPR and metabolic issue related.  She still on pressors to maintain blood pressure, hence I'll not had any cardiac medications.  We will have to wait and see with regard to overall prognosis 1 she is off of sedation and extubated.  I'll continue to follow sidelines for now.  Adrian Prows, M.D. 10/23/2016, 5:57 PM [facility] Pager: [phone] Office: [phone] If no answer: [phone]

## 2016-10-23 NOTE — Progress Notes (Signed)
PULMONARY / CRITICAL CARE MEDICINE   Name: Rachel Duncan MRN: [id number] DOB: 05-27-1981    ADMISSION DATE:  10/21/2016 CONSULTATION DATE:  10/21/16  REFERRING MD:  ED physician  CHIEF COMPLAINT:  V-fib arrest  HISTORY OF PRESENT ILLNESS:   Rachel Duncan is a 35 y/o woman with history of previous alcohol abuse who was brought to [facility] via EMS after a cardiac arrest at home.  Her mother reports that Rachel Duncan was sitting and looking at her phone after finishing dinner.  She suddenly fell and arched back appearing stiff.  The family called 82 and CPR was started by her brother until EMS arrived.  When EMS arrived she was found to be in V-fib.  She received a total of 6 shocks and 1 round of epinephrine before return of spontaneous circulation.  In the ED it was noted that her oropharynx was full of debris. Per ED report she was not following commands prior to intubation.  From the ED she was taken to the cath lab where she was found to have no significant coronary artery disease and a preserved EF.  She was then transferred to the ICU.  Upon my arrival to the ICU she appeared to be actively seizing.  She was given a total of 8mg  ativan with resolution of seizure activity.   SUBJECTIVE:  Remains on low dose norepi Paralyzed and sedated  VITAL SIGNS: BP 100/78 (BP Location: Right Arm)   Pulse 70   Temp (!) 93.6 F (34.2 C) (Core (Comment))   Resp 20   Ht 5\' 2"  (1.575 m)   Wt 80.1 kg (176 lb 9.4 oz) Comment: subtracted pads with water in them  LMP  (LMP Unknown)   SpO2 100%   BMI 32.30 kg/m   HEMODYNAMICS: CVP:  [4 mmHg-6 mmHg] 5 mmHg  VENTILATOR SETTINGS: Vent Mode: PRVC FiO2 (%):  [30 %] 30 % Set Rate:  [20 bmp] 20 bmp Vt Set:  [40 mL-400 mL] 40 mL PEEP:  [5 cmH20] 5 cmH20 Plateau Pressure:  [18 cmH20-20 cmH20] 19 cmH20  INTAKE / OUTPUT: I/O last 3 completed shifts: In: 3382.4 [I.V.:2292.4; NG/GT:30; IV Piggyback:1060] Out: 2395 [Urine:2145; Emesis/NG output:250]  PHYSICAL  EXAMINATION: General:  Laying in bed, intubated sedated. paralyzed Neuro:  Sedated HEENT:  Pupils pinpoint, ETT in place, bruise on left lower inner lip. (+) cervical collar in place Cardiovascular:  Tachycardic, no MRG Lungs:  CTAB, no WRR Abdomen:  Obese, non-distended, minimal bowel sounds, no palpable HSM Musculoskeletal:  No edema, no joint abnormalities Skin:  Bruise on forehead, no rashes or other lesions.   LABS:  BMET  Recent Labs Lab 10/22/16 2200 10/23/16 0200 10/23/16 0415 10/23/16 0810  NA 138 135 136 143  K 3.7 3.5 3.2* 3.3*  CL 116* 113* 114* 111  CO2 16* 16* 19*  --   BUN 5* 6 6 3*  CREATININE 0.51 0.53 0.51 0.40*  GLUCOSE 118* 122* 110* 110*    Electrolytes  Recent Labs Lab 10/21/16 2247  10/22/16 0400  10/22/16 2200 10/23/16 0200 10/23/16 0415  CALCIUM 8.0*  < >  --   < > 8.1* 8.0* 7.8*  MG 2.2  --  1.7  --   --   --  2.0  PHOS  --   --  2.8  --   --   --  1.1*  < > = values in this interval not displayed.  CBC  Recent Labs Lab 10/21/16 2247  10/22/16 0400  10/22/16 [id number]  10/23/16 0415 10/23/16 0810  WBC 14.5*  --  12.3*  --   --  2.9*  --   HGB 13.0  < > 12.1  < > 11.6* 9.9* 10.2*  HCT 40.7  < > 37.0  < > 34.0* 28.5* 30.0*  PLT 239  --  238  --   --  167  --   < > = values in this interval not displayed.  Coag's  Recent Labs Lab 10/22/16 0200 10/22/16 1036  APTT 21* 29  INR 1.28 1.23    Sepsis Markers  Recent Labs Lab 10/22/16 0200 10/22/16 1035 10/22/16 1700  LATICACIDVEN 4.5* 2.0* 1.6    ABG  Recent Labs Lab 10/22/16 0423 10/22/16 0600 10/23/16 0420  PHART 7.195* 7.310* 7.495*  PCO2ART 42.3 25.1* 23.0*  PO2ART 146* 188.0* 179*    Liver Enzymes  Recent Labs Lab 10/21/16 2247 10/22/16 1400 10/23/16 0415  AST 732* 793* 333*  ALT 679* 512* 388*  ALKPHOS 91 45 42  BILITOT 0.1* 0.4 0.4  ALBUMIN 3.4* 2.8* 2.7*    Cardiac Enzymes  Recent Labs Lab 10/22/16 0832 10/22/16 1445 10/22/16 1800   TROPONINI 1.19* 0.81* 0.68*    Glucose  Recent Labs Lab 10/22/16 1625 10/22/16 1810 10/22/16 2026 10/22/16 2204 10/23/16 0157 10/23/16 0410  GLUCAP 133* 130* 123* 114* 116* 107*    Imaging Dg Chest Port 1 View  Result Date: 10/23/2016 CLINICAL DATA:  Respiratory failure. EXAM: PORTABLE CHEST 1 VIEW COMPARISON:  10/22/2016 . FINDINGS: Endotracheal tube and NG tube in stable position. Right subclavian line at cavoatrial junction. Cardiomegaly. Low lung volumes with basilar atelectasis. Mild pulmonary interstitial prominence. Mild interstitial edema cannot be completely excluded. No pleural effusion or pneumothorax . IMPRESSION: 1. Right subclavian line tip at cavoatrial junction. Endotracheal tube and NG tube in stable position. 2. Low lung volume with basilar atelectasis. 3. Mild interstitial prominence noted. Mild interstitial edema cannot be exclude completely excluded. Electronically Signed   By: Marcello Moores  Register   On: 10/23/2016 07:14     STUDIES:  Head CT 10/21/16 - no acute findings Cardiac Cath 10/21/16 - clean coronaries, preserved EF.   CULTURES: Urine 12/3 > Blood 12/3 > MRSA 12/3 > Trache 12/3 >   ANTIBIOTICS: Rocephin 12/3 >    SIGNIFICANT EVENTS: 12/2 admit Vfib arrest with sze. N coronaries. TTM.   LINES/TUBES: ETT 10/21/16 Right subclavian TLC 10/21/16  DISCUSSION: 35 y/o woman with out of hospital seizure and then v-fib arrest and likely aspiration. Hypothermia initiated.  L cath clean.   ASSESSMENT / PLAN:  PULMONARY A: Acute hypoxemic resp failure 2/2 unable to protect airway and asp pna P:   Lung protective ventilation Cont vent support. Assess for possible SBT depending on MS after rewarming  VAP prevention  CARDIOVASCULAR A:  V-fib arrest. S/P L cath 12/3 with Normal coronaries and EF P:  Cooling 53F protocol, will rewarm today Appreciate cards assistance.   RENAL A:   Metabolic acidosis P:   Change bicarb gtt to [facility]  Follow  BMP   ENDOCRINE A:   Risk of hyperglycemia   P:   Monitor blood sugar on chemistries   NEUROLOGIC A:   Seizure, post arrest Concern for Anoxic ischemic encephalopathy 2/2 arrest H/o ETOH use and last drink was Thanksgiving.  ETOH level on admission was 6. P:   RASS goal: -4 No history of seizure, may be related to alcohol use / withdrawal Neurology following, EEG result pending Cont Keppra Giving daily B6 as  pyridoxine deficiency has been reported as a cause of seizure in chronic alcoholics.  Versed/Fentanyl/Nimbex drips Assess for wakeup after rewarmed    INFECTIOUS A:  Asp PNA Possible UTI P: Cont Rocephin for now. Consider anaerobic coverage if she decompensates  Follow cx data and CXR   GASTROENTEROLOGY A:  Transaminitis, likely shock liver, improving P: Trend LFTs.    PROPHYLAXIS : heparin sq    FAMILY  - Updates: Family updated at bedside by Dr. Corrie Dandy on 12/3.  Spoke to mother and rest of family. Mother makes decision.  No family present on 12/4  - Inter-disciplinary family meet or Palliative Care meeting due by:  10/28/16  Independent CC time 70 minutes  Baltazar Apo, MD, PhD 10/23/2016, 11:41 AM [place] [facility] [phone] or if no answer [phone]

## 2016-10-23 NOTE — Progress Notes (Signed)
[place] Progress Note Patient Name: Rachel Duncan DOB: May 07, 1981 MRN: [id number]   Date of Service  10/23/2016  HPI/Events of Note    eICU Interventions  Hypokalemia, repleted      Intervention Category Major Interventions: Electrolyte abnormality - evaluation and management  Simonne Maffucci 10/23/2016, 4:04 AM

## 2016-10-23 NOTE — Progress Notes (Signed)
EEG completed at bedside, results pending.  Uneventful study.

## 2016-10-23 NOTE — Procedures (Signed)
ELECTROENCEPHALOGRAM REPORT  Date of Study: 10/23/16  Patient's Name: Rachel Duncan MRN: [id number] Date of Birth: [birth year]  Referring Provider: Titus Mould  Clinical History: 35 y/o with seizure and CP arrest   Technical Summary: A multichannel digital EEG recording measured by the international 10-20 system with electrodes applied with paste and impedances below 5000 ohms performed in our laboratory with EKG monitoring in an intubated and sedated patient.  Hyperventilation and photic stimulation were not performed.  The digital EEG was referentially recorded, reformatted, and digitally filtered in a variety of bipolar and referential montages for optimal display.    Description: The patient is intubated and sedated, on nimbex, fentanyl and versed during the recording. There is loss of normal background activity. The records read at a sensitivity of 3 uV/mm shows diffuse suppression of background activity. Intermittent burst suppression activity is noted.  There is no spontaneous reactivity or reactivity noted with noxious stimulation.  Hyperventilation and photic stimulation were not performed. There were no epileptiform discharges or electrographic seizures seen.   EKG lead was unremarkable.  Impression: This EEG is markedly abnormal due to diffuse background suppression and lack of EEG reactivity.  There was no epileptiform activity on this recording, but this does not rule out seizure disorder.  Clinical Correlation of the above findings indicates severe diffuse cerebral dysfunction that is non-specific in etiology and can be seen in the setting of anoxic/ischemic injury, toxic/metabolic encephalopathies, or medication effect. In the absence of CNS active, sedating, or anesthetic medications, this suggests a poor prognosis. Clinical correlation is advised.

## 2016-10-23 NOTE — Progress Notes (Signed)
Initial Nutrition Assessment  DOCUMENTATION CODES:   Obesity unspecified  INTERVENTION:   Once pt is re-warmed, recommend:  Initiate TF via OGT with Vital High Protein at goal rate of 30 ml/h (720 ml per day) and Prostat 30 ml TID to provide 1020 kcals, 108 gm protein, 602 ml free water daily.  NUTRITION DIAGNOSIS:   Inadequate oral intake related to inability to eat as evidenced by NPO status.  GOAL:   Provide needs based on ASPEN/SCCM guidelines  MONITOR:   Vent status, Labs, Weight trends, Skin, I & O's  REASON FOR ASSESSMENT:   Ventilator    ASSESSMENT:   35 y/o woman with out of hospital v-fib arrest and likely aspiration, possibly due to seizure activity  Pt admitted with v-fib arrest s/p seizure.   Patient is currently intubated on ventilator support MV: 8.6 L/min Temp (24hrs), Avg:91.5 F (33.1 C), Min:89.2 F (31.8 C), Max:92.7 F (33.7 C)  Case discussed with RN. OGT connected to low, intermittent suction; output >100 ml this shift. Per night shift report, a lot of suction contained undigested food.   Nutrition-Focused physical exam completed. Findings are no fat depletion, no muscle depletion, and no edema.   Arctic sun protocol initiated on 10/22/16. Per discussion with RN, no plans to start TF today. Current temp 33.9 C.   Medications reviewed and include levophed and nimbex.   Labs reviewed: K: 3.2, Phos: 1.1, CBGS: 107.   Diet Order:  Diet NPO time specified  Skin:  Reviewed, no issues  Last BM:  PTA  Height:   Ht Readings from Last 1 Encounters:  10/22/16 5\' 2"  (1.575 m)    Weight:   Wt Readings from Last 1 Encounters:  10/23/16 176 lb 9.4 oz (80.1 kg)    Ideal Body Weight:  50 kg  BMI:  Body mass index is 32.3 kg/m.  Estimated Nutritional Needs:   Kcal:  [id number]  Protein:  > 100 grams  Fluid:  > 1 L  EDUCATION NEEDS:   No education needs identified at this time  Macarena Langseth A. Jimmye Norman, RD, LDN, CDE Pager:  [phone] After hours Pager: [phone]

## 2016-10-23 NOTE — Progress Notes (Signed)
Noted patients CBG's not pulling through to epic results. New wrist band applied to patient with 2 rn's to verify. Glucometer docked at different station. Will notify point of care if meter still fails to pull results through. Will continue to monitor.

## 2016-10-23 NOTE — Progress Notes (Signed)
Subjective: Still on nimbex  Exam: Vitals:   10/23/16 1000 10/23/16 1100  BP:    Pulse: 70 70  Resp: 20 20  Temp: (!) 92.7 F (33.7 C) (!) 93.6 F (34.2 C)   Gen: In bed, intubated, on nibex Resp: ventilated Abd: soft, nt  Neuro: PERRL, other testing precluded by paralytic.   Pertinent Labs: Bmp - hypokalemia  Impression: 35 yo F s/p arrest with seizures followed by cardiac arrest, undergoing cooling protocol.   Recommendations: 1) EEG today.  2) agree with keppra for now 3) will follow.   Roland Rack, MD [facility] [phone]  If 7pm- 7am, please page neurology on call as listed in [place].

## 2016-10-23 NOTE — Progress Notes (Signed)
[place] Progress Note Patient Name: Rachel Duncan DOB: 1981-04-06 MRN: [id number]   Date of Service  10/23/2016  HPI/Events of Note    eICU Interventions  Hypokalemia, repleted      Intervention Category Intermediate Interventions: Electrolyte abnormality - evaluation and management  Simonne Maffucci 10/23/2016, 5:59 AM

## 2016-10-24 ENCOUNTER — Inpatient Hospital Stay (HOSPITAL_COMMUNITY): Payer: Medicare Other

## 2016-10-24 DIAGNOSIS — G931 Anoxic brain damage, not elsewhere classified: Secondary | ICD-10-CM | POA: Diagnosis present

## 2016-10-24 LAB — POCT I-STAT 3, ART BLOOD GAS (G3+)
ACID-BASE DEFICIT: 9 mmol/L — AB (ref 0.0–2.0)
ACID-BASE DEFICIT: 9 mmol/L — AB (ref 0.0–2.0)
ACID-BASE DEFICIT: 6 mmol/L — AB (ref 0.0–2.0)
BICARBONATE: 19.9 mmol/L — AB (ref 20.0–28.0)
BICARBONATE: 19.4 mmol/L — AB (ref 20.0–28.0)
Bicarbonate: 20.2 mmol/L (ref 20.0–28.0)
O2 SAT: 94 %
O2 SAT: 98 %
O2 SAT: 99 %
PH ART: 7.14 — AB (ref 7.350–7.450)
TCO2: 22 mmol/L (ref 0–100)
TCO2: 21 mmol/L (ref 0–100)
TCO2: 21 mmol/L (ref 0–100)
pCO2 arterial: 59.1 mmHg — ABNORMAL HIGH (ref 32.0–48.0)
pCO2 arterial: 50.4 mmHg — ABNORMAL HIGH (ref 32.0–48.0)
pCO2 arterial: 38.8 mmHg (ref 32.0–48.0)
pH, Arterial: 7.202 — ABNORMAL LOW (ref 7.350–7.450)
pH, Arterial: 7.31 — ABNORMAL LOW (ref 7.350–7.450)
pO2, Arterial: 89 mmHg (ref 83.0–108.0)
pO2, Arterial: 129 mmHg — ABNORMAL HIGH (ref 83.0–108.0)
pO2, Arterial: 129 mmHg — ABNORMAL HIGH (ref 83.0–108.0)

## 2016-10-24 LAB — BASIC METABOLIC PANEL
ANION GAP: 6 (ref 5–15)
BUN: 5 mg/dL — ABNORMAL LOW (ref 6–20)
CALCIUM: 8.2 mg/dL — AB (ref 8.9–10.3)
CO2: 19 mmol/L — ABNORMAL LOW (ref 22–32)
CREATININE: 0.74 mg/dL (ref 0.44–1.00)
Chloride: 112 mmol/L — ABNORMAL HIGH (ref 101–111)
Glucose, Bld: 92 mg/dL (ref 65–99)
Potassium: 4 mmol/L (ref 3.5–5.1)
SODIUM: 137 mmol/L (ref 135–145)

## 2016-10-24 LAB — GLUCOSE, CAPILLARY
GLUCOSE-CAPILLARY: 58 mg/dL — AB (ref 65–99)
GLUCOSE-CAPILLARY: 88 mg/dL (ref 65–99)
Glucose-Capillary: 64 mg/dL — ABNORMAL LOW (ref 65–99)
Glucose-Capillary: 75 mg/dL (ref 65–99)
Glucose-Capillary: 80 mg/dL (ref 65–99)
Glucose-Capillary: 81 mg/dL (ref 65–99)
Glucose-Capillary: 86 mg/dL (ref 65–99)
Glucose-Capillary: 96 mg/dL (ref 65–99)

## 2016-10-24 LAB — CBC
HEMATOCRIT: 29 % — AB (ref 36.0–46.0)
HEMOGLOBIN: 9.8 g/dL — AB (ref 12.0–15.0)
MCH: 29.7 pg (ref 26.0–34.0)
MCHC: 33.8 g/dL (ref 30.0–36.0)
MCV: 87.9 fL (ref 78.0–100.0)
Platelets: 156 10*3/uL (ref 150–400)
RBC: 3.3 MIL/uL — AB (ref 3.87–5.11)
RDW: 13.9 % (ref 11.5–15.5)
WBC: 5.6 10*3/uL (ref 4.0–10.5)

## 2016-10-24 LAB — PROTIME-INR
INR: 1.28
Prothrombin Time: 16.1 seconds — ABNORMAL HIGH (ref 11.4–15.2)

## 2016-10-24 LAB — CULTURE, RESPIRATORY: SPECIAL REQUESTS: NORMAL

## 2016-10-24 LAB — MAGNESIUM: MAGNESIUM: 1.7 mg/dL (ref 1.7–2.4)

## 2016-10-24 LAB — PHOSPHORUS: PHOSPHORUS: 3.2 mg/dL (ref 2.5–4.6)

## 2016-10-24 LAB — LACTIC ACID, PLASMA: LACTIC ACID, VENOUS: 0.6 mmol/L (ref 0.5–1.9)

## 2016-10-24 LAB — CULTURE, RESPIRATORY W GRAM STAIN: Culture: NORMAL

## 2016-10-24 MED ORDER — DEXTROSE 50 % IV SOLN
INTRAVENOUS | Status: AC
  Administered 2016-10-24: 25 mL
  Filled 2016-10-24: qty 50

## 2016-10-24 MED ORDER — ROCURONIUM BROMIDE 50 MG/5ML IV SOLN
50.0000 mg | Freq: Once | INTRAVENOUS | Status: AC
  Administered 2016-10-24: 50 mg via INTRAVENOUS
  Filled 2016-10-24: qty 5

## 2016-10-24 MED ORDER — MIDAZOLAM HCL 2 MG/2ML IJ SOLN
2.0000 mg | Freq: Once | INTRAMUSCULAR | Status: AC
  Administered 2016-10-24: 2 mg via INTRAVENOUS

## 2016-10-24 MED ORDER — DEXTROSE 50 % IV SOLN
25.0000 mL | Freq: Once | INTRAVENOUS | Status: AC
  Administered 2016-10-24: 25 mL via INTRAVENOUS

## 2016-10-24 MED ORDER — ETOMIDATE 2 MG/ML IV SOLN
20.0000 mg | Freq: Once | INTRAVENOUS | Status: AC
  Administered 2016-10-24: 20 mg via INTRAVENOUS

## 2016-10-24 MED ORDER — IBUPROFEN 100 MG/5ML PO SUSP
200.0000 mg | Freq: Once | ORAL | Status: AC
  Administered 2016-10-25: 200 mg via ORAL
  Filled 2016-10-24: qty 10

## 2016-10-24 MED ORDER — ORAL CARE MOUTH RINSE
15.0000 mL | OROMUCOSAL | Status: DC
  Administered 2016-10-24 – 2016-10-28 (×17): 15 mL via OROMUCOSAL

## 2016-10-24 MED ORDER — FENTANYL CITRATE (PF) 100 MCG/2ML IJ SOLN
100.0000 ug | Freq: Once | INTRAMUSCULAR | Status: AC
  Administered 2016-10-24: 100 ug via INTRAVENOUS

## 2016-10-24 MED ORDER — MIDAZOLAM HCL 2 MG/2ML IJ SOLN
INTRAMUSCULAR | Status: AC
  Filled 2016-10-24: qty 4

## 2016-10-24 MED ORDER — FENTANYL 2500MCG IN NS 250ML (10MCG/ML) PREMIX INFUSION
25.0000 ug/h | INTRAVENOUS | Status: DC
  Administered 2016-10-25: 175 ug/h via INTRAVENOUS
  Administered 2016-10-25: 150 ug/h via INTRAVENOUS
  Filled 2016-10-24 (×2): qty 250

## 2016-10-24 MED ORDER — MIDAZOLAM HCL 2 MG/2ML IJ SOLN
1.0000 mg | INTRAMUSCULAR | Status: DC | PRN
  Administered 2016-10-24 – 2016-10-25 (×5): 2 mg via INTRAVENOUS
  Filled 2016-10-24 (×5): qty 2

## 2016-10-24 MED ORDER — MAGNESIUM SULFATE 2 GM/50ML IV SOLN
2.0000 g | Freq: Once | INTRAVENOUS | Status: AC
  Administered 2016-10-24: 2 g via INTRAVENOUS
  Filled 2016-10-24: qty 50

## 2016-10-24 MED ORDER — FENTANYL CITRATE (PF) 100 MCG/2ML IJ SOLN
INTRAMUSCULAR | Status: AC
  Filled 2016-10-24: qty 4

## 2016-10-24 NOTE — Progress Notes (Signed)
Pt extubated at 1130 am to [facility]. Pt was somnolent but arousal all day and followed commands. At 1800 pt started to desat 67-88% and placed on non-rebreather, she was hypertensive and tachycardic. She became more lethargic and not protecting her airway. Dr Nelda Marseille asked to camera in the room and assess situation.

## 2016-10-24 NOTE — Procedures (Signed)
Extubation Procedure Note  Patient Details:   Name: Jenness Jablonowski DOB: 25-May-1981 MRN: [id number]   Airway Documentation:     Evaluation  O2 sats: stable throughout Complications: No apparent complications Patient did tolerate procedure well. Bilateral Breath Sounds: Clear, Diminished   Yes  PT was extubated to a 3L [place]  PT was able to speak  Sats are stable  RT to monitor  Ruthe Roemer, Leonie Douglas 10/24/2016, 11:37 AM

## 2016-10-24 NOTE — Progress Notes (Signed)
Subjective: Greatly improved, just extubated.   Exam: Vitals:   10/24/16 1200 10/24/16 1300  BP:    Pulse: 78 72  Resp: 17 12  Temp: 100.2 F (37.9 C)    Gen: In bed, NAD Resp: non-labored breathing, no acute distress Abd: soft, nt  Neuro: MS: awake, following commands [id number], EOMI Motor: moves all extremities to command.  Sensory:responds to stim x 4.   Pertinent Labs: BMP unremarkable.   Impression: 35 yo F with seizure precipitating cardiac arrest, ? etoh related. She appears to be improving.   Recommendations: 1)  Continue keppra 2) will follow.   Roland Rack, MD [facility] [phone]  If 7pm- 7am, please page neurology on call as listed in [place].

## 2016-10-24 NOTE — Procedures (Signed)
Intubation Procedure Note Infant Cotler [id number] 1981/08/07  Procedure: Intubation Indications: Airway protection and maintenance  Procedure Details Consent: Unable to obtain consent because of emergent medical necessity. Time Out: Verified patient identification, verified procedure, site/side was marked, verified correct patient position, special equipment/implants available, medications/allergies/relevent history reviewed, required imaging and test results available.  Performed  Maximum sterile technique was used including gloves, gown, hand hygiene and mask.  MAC    Evaluation Hemodynamic Status: BP stable throughout; O2 sats: stable throughout Patient's Current Condition: stable Complications: No apparent complications Patient did tolerate procedure well. Chest X-ray ordered to verify placement.  CXR: pending.   Darlene Brozowski 10/24/2016

## 2016-10-24 NOTE — Procedures (Signed)
OGT Insertion By MD  Under direct laryngoscopy OGT was inserted and visualized then verified by auscultation.  Rush Farmer, M.D. [facility] Pulmonary/Critical Care Medicine. Pager: [phone]. After hours pager: [phone].

## 2016-10-24 NOTE — Progress Notes (Addendum)
PULMONARY / CRITICAL CARE MEDICINE   Name: Rachel Duncan MRN: [id number] DOB: [birth year]    ADMISSION DATE:  10/21/2016 CONSULTATION DATE:  10/21/16  REFERRING MD:  ED physician  CHIEF COMPLAINT:  V-fib arrest  HISTORY OF PRESENT ILLNESS:   Rachel Duncan is a 35 y/o woman with history of previous alcohol abuse who was brought to [facility] via EMS after a cardiac arrest at home.  Her mother reports that Rachel Duncan was sitting and looking at her phone after finishing dinner.  She suddenly fell and arched back appearing stiff.  The family called 72 and CPR was started by her brother until EMS arrived.  When EMS arrived she was found to be in V-fib.  She received a total of 6 shocks and 1 round of epinephrine before return of spontaneous circulation.  In the ED it was noted that her oropharynx was full of debris. Per ED report she was not following commands prior to intubation.  From the ED she was taken to the cath lab where she was found to have no significant coronary artery disease and a preserved EF.  She was then transferred to the ICU.  Upon my arrival to the ICU she appeared to be actively seizing.  She was given a total of 8mg  ativan with resolution of seizure activity.   SUBJECTIVE:  Rewarmed this am, paralytics off and sedation down Wakes to voice and tolerates PSV  VITAL SIGNS: BP 127/66   Pulse (!) 105   Temp 98.4 F (36.9 C) (Core (Comment))   Resp 13   Ht 5\' 2"  (1.575 m)   Wt 82.9 kg (182 lb 12.2 oz)   LMP  (LMP Unknown)   SpO2 100%   BMI 33.43 kg/m   HEMODYNAMICS: CVP:  [2 mmHg-12 mmHg] 12 mmHg  VENTILATOR SETTINGS: Vent Mode: PSV;CPAP FiO2 (%):  [30 %] 30 % Set Rate:  [20 bmp] 20 bmp Vt Set:  [400 mL] 400 mL PEEP:  [5 cmH20] 5 cmH20 Pressure Support:  [5 cmH20-10 cmH20] 5 cmH20 Plateau Pressure:  [10 cmH20-24 cmH20] 14 cmH20  INTAKE / OUTPUT: I/O last 3 completed shifts: In: 3228.7 [I.V.:2353.7; NG/GT:30; IV Piggyback:845] Out: [id number] [Urine:1280; Emesis/NG  output:110]  PHYSICAL EXAMINATION: General:  Laying in bed, calm Neuro:  Calm, wakes to voice and follows commands HEENT:  Pupils pinpoint, ETT in place, bruise on left lower inner lip. No pain on palp cervical spine Cardiovascular:  Tachycardic, no MRG Lungs:  Coarse bilaterally Abdomen:  Obese, non-distended, minimal bowel sounds, no palpable HSM Musculoskeletal:  No edema, no joint abnormalities Skin:  Bruise on forehead, no rashes or other lesions.   LABS:  BMET  Recent Labs Lab 10/23/16 1200 10/23/16 1400 10/24/16 0300  NA 139 139 137  K 3.2* 3.6 4.0  CL 116* 114* 112*  CO2 18* 18* 19*  BUN <5* <5* 5*  CREATININE 0.59 0.62 0.74  GLUCOSE 97 89 92    Electrolytes  Recent Labs Lab 10/22/16 0400  10/23/16 0415 10/23/16 1200 10/23/16 1400 10/24/16 0300  CALCIUM  --   < > 7.8* 8.1* 7.9* 8.2*  MG 1.7  --  2.0  --   --  1.7  PHOS 2.8  --  1.1*  --   --  3.2  < > = values in this interval not displayed.  CBC  Recent Labs Lab 10/22/16 0400  10/23/16 0415 10/23/16 0810 10/24/16 0300  WBC 12.3*  --  2.9*  --  5.6  HGB 12.1  < >  9.9* 10.2* 9.8*  HCT 37.0  < > 28.5* 30.0* 29.0*  PLT 238  --  167  --  156  < > = values in this interval not displayed.  Coag's  Recent Labs Lab 10/22/16 0200 10/22/16 1036 10/24/16 0300  APTT 21* 29  --   INR 1.28 1.23 1.28    Sepsis Markers  Recent Labs Lab 10/22/16 1035 10/22/16 1700 10/24/16 0300  LATICACIDVEN 2.0* 1.6 0.6    ABG  Recent Labs Lab 10/22/16 0423 10/22/16 0600 10/23/16 0420  PHART 7.195* 7.310* 7.495*  PCO2ART 42.3 25.1* 23.0*  PO2ART 146* 188.0* 179*    Liver Enzymes  Recent Labs Lab 10/21/16 2247 10/22/16 1400 10/23/16 0415  AST 732* 793* 333*  ALT 679* 512* 388*  ALKPHOS 91 45 42  BILITOT 0.1* 0.4 0.4  ALBUMIN 3.4* 2.8* 2.7*    Cardiac Enzymes  Recent Labs Lab 10/22/16 0832 10/22/16 1445 10/22/16 1800  TROPONINI 1.19* 0.81* 0.68*    Glucose  Recent Labs Lab  10/23/16 0410 10/23/16 2216 10/23/16 2341 10/24/16 0515 10/24/16 0600 10/24/16 0831  GLUCAP 107* 68 100* 58* 86 64*    Imaging Dg Chest Port 1 View  Result Date: 10/24/2016 CLINICAL DATA:  Acute respiratory failure EXAM: PORTABLE CHEST 1 VIEW COMPARISON:  10/23/2016 FINDINGS: Cardiac shadow remains enlarged. An endotracheal tube, a right-sided central venous catheter and nasogastric catheter are noted in satisfactory position. The lungs are well aerated bilaterally. No infiltrate or pneumothorax is noted. No acute bony abnormality is seen. IMPRESSION: No acute abnormality noted. Tubes and lines as described. Electronically Signed   By: Inez Catalina M.D.   On: 10/24/2016 07:45     STUDIES:  Head CT 10/21/16 - no acute findings Cardiac Cath 10/21/16 - clean coronaries, preserved EF.  EEG 12/3 > no epileptiform focus  CULTURES: Urine 12/3 > Blood 12/3 > MRSA 12/3 > negative Trach 12/3 >   ANTIBIOTICS: Rocephin 12/3 >    SIGNIFICANT EVENTS: 12/2 admit Vfib arrest with sze. N coronaries. TTM.   LINES/TUBES: ETT 10/21/16 >>  Right subclavian TLC 10/21/16 >>   DISCUSSION: 35 y/o woman with out of hospital seizure and then v-fib arrest and likely aspiration. Hypothermia initiated.  L cath clean.   ASSESSMENT / PLAN:  PULMONARY A: Acute hypoxemic resp failure 2/2 unable to protect airway asp pna P:   Assess for possible extubation 12/5 given her intact MS VAP prevention  CARDIOVASCULAR A:  V-fib arrest. S/P L cath 12/3 with Normal coronaries and EF P:  Cooling 48F protocol completed Appreciate cards assistance.   RENAL A:   Metabolic acidosis P:   D/c bicarb gtt on 12/5 Follow BMP   ENDOCRINE A:   At risk Hyperglycemia  P:   Monitor blood sugar on chemistries   NEUROLOGIC A:   Seizure, post arrest; EEG 12/3 > no epileptiform focus Concern for Anoxic ischemic encephalopathy 2/2 arrest, but good MS on 12/5 am wakeup assessment H/o ETOH use and last drink  was Thanksgiving.  ETOH level on admission was 6. P:   RASS goal: 0  No history of seizure, may be related to alcohol use / withdrawal Neurology following Cont Keppra Giving daily B6 as pyridoxine deficiency has been reported as a cause of seizure in chronic alcoholics.  Versed/Fentanyl/Nimbex drips to off Clinically cleared c-spine am 12/5 > remove c collar   INFECTIOUS A:  Asp PNA Possible UTI P: Cont Rocephin for now. Consider anaerobic coverage if she decompensates  Follow cx  data and CXR   GASTROENTEROLOGY A:  Transaminitis, likely shock liver, improving P: Trend LFTs again 12/6   PROPHYLAXIS : heparin sq    FAMILY  - Updates: Family updated at bedside by Dr. Corrie Dandy on 12/3.  Spoke to mother and rest of family. Mother makes decision.  No family present on 12/4 or 12/5  - Inter-disciplinary family meet or Palliative Care meeting due by:  10/28/16  Independent CC time 59 minutes  Baltazar Apo, MD, PhD 10/24/2016, 9:46 AM [place] [phone] or if no answer [phone]

## 2016-10-24 NOTE — Progress Notes (Signed)
Mother, Rachel Duncan was called and updated on pts status.

## 2016-10-25 LAB — HEPATIC FUNCTION PANEL
ALBUMIN: 2.8 g/dL — AB (ref 3.5–5.0)
ALK PHOS: 55 U/L (ref 38–126)
ALT: 253 U/L — AB (ref 14–54)
AST: 137 U/L — AB (ref 15–41)
BILIRUBIN INDIRECT: 0.5 mg/dL (ref 0.3–0.9)
Bilirubin, Direct: 0.2 mg/dL (ref 0.1–0.5)
TOTAL PROTEIN: 6 g/dL — AB (ref 6.5–8.1)
Total Bilirubin: 0.7 mg/dL (ref 0.3–1.2)

## 2016-10-25 LAB — GLUCOSE, CAPILLARY
GLUCOSE-CAPILLARY: 65 mg/dL (ref 65–99)
GLUCOSE-CAPILLARY: 101 mg/dL — AB (ref 65–99)
GLUCOSE-CAPILLARY: 79 mg/dL (ref 65–99)
GLUCOSE-CAPILLARY: 100 mg/dL — AB (ref 65–99)
Glucose-Capillary: 82 mg/dL (ref 65–99)
Glucose-Capillary: 92 mg/dL (ref 65–99)

## 2016-10-25 LAB — BASIC METABOLIC PANEL
ANION GAP: 9 (ref 5–15)
CHLORIDE: 108 mmol/L (ref 101–111)
CO2: 19 mmol/L — ABNORMAL LOW (ref 22–32)
Calcium: 8.7 mg/dL — ABNORMAL LOW (ref 8.9–10.3)
Creatinine, Ser: 0.78 mg/dL (ref 0.44–1.00)
GFR calc Af Amer: >60 mL/min (ref >60)
Glucose, Bld: 101 mg/dL — ABNORMAL HIGH (ref 65–99)
POTASSIUM: 4 mmol/L (ref 3.5–5.1)
SODIUM: 136 mmol/L (ref 135–145)

## 2016-10-25 LAB — MAGNESIUM: Magnesium: 2.1 mg/dL (ref 1.7–2.4)

## 2016-10-25 MED ORDER — FENTANYL CITRATE (PF) 100 MCG/2ML IJ SOLN: 100.0000 ug | INTRAMUSCULAR | Status: DC | PRN

## 2016-10-25 MED ORDER — DEXTROSE 5 % IV SOLN
INTRAVENOUS | Status: DC
  Administered 2016-10-25 – 2016-10-26 (×2): via INTRAVENOUS

## 2016-10-25 MED ORDER — METOPROLOL TARTRATE 5 MG/5ML IV SOLN
2.5000 mg | Freq: Four times a day (QID) | INTRAVENOUS | Status: DC
  Administered 2016-10-25 – 2016-10-26 (×3): 2.5 mg via INTRAVENOUS
  Filled 2016-10-25 (×3): qty 5

## 2016-10-25 MED ORDER — DEXTROSE 50 % IV SOLN
12.5000 g | Freq: Once | INTRAVENOUS | Status: AC
  Administered 2016-10-25: 12.5 g via INTRAVENOUS

## 2016-10-25 MED ORDER — DEXTROSE 50 % IV SOLN
INTRAVENOUS | Status: AC
  Filled 2016-10-25: qty 50

## 2016-10-25 NOTE — Progress Notes (Signed)
[place] Progress Note Patient Name: Rachel Duncan DOB: Nov 14, 1981 MRN: [id number]   Date of Service  10/25/2016  HPI/Events of Note  Blood glucose = 65.   eICU Interventions  Will order: 1. D50 12.5 gm IV now. 2. D5W to run IV at 50 mL/hour.      Intervention Category Major Interventions: Other:  Rachel Duncan 10/25/2016, 4:24 AM

## 2016-10-25 NOTE — Progress Notes (Signed)
PULMONARY / CRITICAL CARE MEDICINE   Name: Rachel Duncan MRN: [id number] DOB: 1981/04/08    ADMISSION DATE:  10/21/2016 CONSULTATION DATE:  10/21/16  REFERRING MD:  ED physician  CHIEF COMPLAINT:  V-fib arrest  HISTORY OF PRESENT ILLNESS:   Rachel Duncan is a 35 y/o woman with history of previous alcohol abuse who was brought to [facility] via EMS after a cardiac arrest at home.  Her mother reports that Rachel Duncan was sitting and looking at her phone after finishing dinner.  She suddenly fell and arched back appearing stiff.  The family called 31 and CPR was started by her brother until EMS arrived.  When EMS arrived she was found to be in V-fib.  She received a total of 6 shocks and 1 round of epinephrine before return of spontaneous circulation.  In the ED it was noted that her oropharynx was full of debris. Per ED report she was not following commands prior to intubation.  From the ED she was taken to the cath lab where she was found to have no significant coronary artery disease and a preserved EF.  She was then transferred to the ICU.  Upon my arrival to the ICU she appeared to be actively seizing.  She was given a total of 8mg  ativan with resolution of seizure activity.   SUBJECTIVE:  Sedation off since 8:00 Awake but a little lethargic, has periods of apnea when on PSV  VITAL SIGNS: BP (!) 100/56   Pulse 82   Temp 97.2 F (36.2 C) (Core (Comment)) Comment (Src): temp foley  Resp (!) 30   Ht 5\' 2"  (1.575 m)   Wt 83.9 kg (184 lb 15.5 oz)   LMP  (LMP Unknown)   SpO2 100%   BMI 33.83 kg/m   HEMODYNAMICS: CVP:  [6 mmHg] 6 mmHg  VENTILATOR SETTINGS: Vent Mode: PRVC FiO2 (%):  [40 %-100 %] 40 % Set Rate:  [15 bmp-30 bmp] 30 bmp Vt Set:  [330 mL-400 mL] 400 mL PEEP:  [5 cmH20] 5 cmH20 Plateau Pressure:  [13 cmH20-27 cmH20] 25 cmH20  INTAKE / OUTPUT: I/O last 3 completed shifts: In: 1313.3 [I.V.:733.3; IV Piggyback:580] Out: 705 [Urine:705]  PHYSICAL EXAMINATION: General: Adult  female on vent , no distress  Neuro:  Lethargic, follows commands, moves all extremities  HEENT:  ETT in place Cardiovascular:  RRR, no MRG Lungs:  Unlabored, diminished at bases, on vent   Abdomen:  Obese, non-distended, hypoactive bowel sounds, no palpable HSM Musculoskeletal:  No edema, no joint abnormalities Skin:  Bruise on forehead, no rashes or other lesions.   LABS:  BMET  Recent Labs Lab 10/23/16 1400 10/24/16 0300 10/25/16 0330  NA 139 137 136  K 3.6 4.0 4.0  CL 114* 112* 108  CO2 18* 19* 19*  BUN <5* 5* <5*  CREATININE 0.62 0.74 0.78  GLUCOSE 89 92 101*    Electrolytes  Recent Labs Lab 10/22/16 0400  10/23/16 0415  10/23/16 1400 10/24/16 0300 10/25/16 0330  CALCIUM  --   < > 7.8*  < > 7.9* 8.2* 8.7*  MG 1.7  --  2.0  --   --  1.7 2.1  PHOS 2.8  --  1.1*  --   --  3.2  --   < > = values in this interval not displayed.  CBC  Recent Labs Lab 10/22/16 0400  10/23/16 0415 10/23/16 0810 10/24/16 0300  WBC 12.3*  --  2.9*  --  5.6  HGB 12.1  < >  9.9* 10.2* 9.8*  HCT 37.0  < > 28.5* 30.0* 29.0*  PLT 238  --  167  --  156  < > = values in this interval not displayed.  Coag's  Recent Labs Lab 10/22/16 0200 10/22/16 1036 10/24/16 0300  APTT 21* 29  --   INR 1.28 1.23 1.28    Sepsis Markers  Recent Labs Lab 10/22/16 1035 10/22/16 1700 10/24/16 0300  LATICACIDVEN 2.0* 1.6 0.6    ABG  Recent Labs Lab 10/24/16 2028 10/24/16 2133 10/24/16 2314  PHART 7.140* 7.202* 7.310*  PCO2ART 59.1* 50.4* 38.8  PO2ART 89.0 129.0* 129.0*    Liver Enzymes  Recent Labs Lab 10/22/16 1400 10/23/16 0415 10/25/16 0330  AST 793* 333* 137*  ALT 512* 388* 253*  ALKPHOS 45 42 55  BILITOT 0.4 0.4 0.7  ALBUMIN 2.8* 2.7* 2.8*    Cardiac Enzymes  Recent Labs Lab 10/22/16 0832 10/22/16 1445 10/22/16 1800  TROPONINI 1.19* 0.81* 0.68*    Glucose  Recent Labs Lab 10/24/16 1554 10/24/16 2051 10/24/16 2355 10/25/16 0415 10/25/16 0525  10/25/16 0835  GLUCAP 80 88 81 65 101* 79    Imaging Dg Chest Port 1 View  Result Date: 10/24/2016 CLINICAL DATA:  Acute respiratory failure. EXAM: PORTABLE CHEST 1 VIEW COMPARISON:  0510 hours on the same day FINDINGS: The tip of an endotracheal tube is approximately 3.1 cm above the carina in satisfactory position. Right subclavian central line tip is noted at the cavoatrial junction. Lung volumes are low with atelectasis and/or effusions at the lung bases. There is no pneumothorax. Gastric tube tip is seen in expected location of the stomach. External defibrillator paddles project over the left heart border and left lung base. IMPRESSION: Low lung volumes. Satisfactory support line and tube positions. Bibasilar atelectasis and/or effusions are suggested on current exam at each base. Electronically Signed   By: Ashley Royalty M.D.   On: 10/24/2016 20:13     STUDIES:  Head CT 10/21/16 - no acute findings Cardiac Cath 10/21/16 - clean coronaries, preserved EF.  EEG 12/3 > no epileptiform focus MRI Brain 12/7 >>   CULTURES: Urine 12/3 >neg Blood 12/3 >neg MRSA 12/3 > negative Trach 12/3 > neg   ANTIBIOTICS: Rocephin 12/3 >   SIGNIFICANT EVENTS: 12/2 admit Vfib arrest with sze. N coronaries. TTM.   LINES/TUBES: ETT 10/21/16 >>12/5 ETT 12/5 >> Right subclavian TLC 10/21/16 >>   DISCUSSION: 35 y/o woman with out of hospital seizure and then v-fib arrest and likely aspiration. Hypothermia initiated.  L cath clean. Reintubated 12/5 for lethargy and poor airway protection.   ASSESSMENT / PLAN:  PULMONARY A: Acute hypoxemic resp failure 2/2 unable to protect airway asp pna P:   Vent Support  Wean as tolerated, hopefully retry extubation 12/7 if she wakes up enough to do so.  VAP prevention Maintain Oxygen Saturation >92  CARDIOVASCULAR A:  V-fib arrest. S/P L cath 12/3 with Normal coronaries and EF P:  Appreciate cards assistance.  Tele  Monitoring  Maintain MAP  >65  RENAL A:   Metabolic acidosis - improved  P:   Follow BMP  ENDOCRINE A:   Hypoglycemia   P:   Glucose checks Q4H D5 at 20 ml/hr   NEUROLOGIC A:   Seizure, post arrest; EEG 12/3 > no epileptiform focus Concern for Anoxic ischemic encephalopathy 2/2 arrest, but good MS on 12/5 am wakeup assessment H/o ETOH use and last drink was Thanksgiving.  ETOH level on admission was 6. P:  RASS goal: 0  Neurology following Cont Keppra Giving daily B6 as pyridoxine x 5 days as deficiency has been reported as a cause of seizure in chronic alcoholics.  Minimize sedation MRI brain ordered but not yet done   INFECTIOUS A:  Asp PNA Possible UTI P: Cont Rocephin for now. Consider anaerobic coverage if she decompensates  Follow cx data and CXR  Trend Fever and WBC curve   GASTROENTEROLOGY A:  Transaminitis, likely shock liver, improving P: Trend LFTs  PROPHYLAXIS : heparin sq   FAMILY  - Updates: Family updated at bedside by Dr. Corrie Dandy on 12/3.  Spoke to mother and rest of family. Mother makes decision. Dr Lamonte Sakai spoke with family at bedside 12/5.  - Inter-disciplinary family meet or Palliative Care meeting due by:  10/28/16  Independent CC time 31 minutes  Baltazar Apo, MD, PhD 10/26/2016, 11:36 AM [facility] [phone] or if no answer [phone]

## 2016-10-25 NOTE — Progress Notes (Signed)
Subjective:  Mildly Sedated and intubated, responds to simple commands. Reintubated last evening due to fatigue and work of breathing. BP stable.  Objective:  Vital Signs in the last 24 hours: Temp:  [96.8 F (36 C)-100.2 F (37.9 C)] 98.8 F (37.1 C) (12/06 1204) Pulse Rate:  [52-137] 95 (12/06 1200) Resp:  [12-30] 30 (12/06 1200) BP: (92-125)/(44-78) 101/59 (12/06 1200) SpO2:  [62 %-100 %] 98 % (12/06 1200) Arterial Line BP: (99-192)/(54-109) 122/57 (12/06 1200) FiO2 (%):  [40 %-100 %] 40 % (12/06 1200) Weight:  [83.9 kg (184 lb 15.5 oz)] 83.9 kg (184 lb 15.5 oz) (12/06 0400) Blood pressure (!) 101/59, pulse 95, temperature 98.8 F (37.1 C), temperature source Oral, resp. rate (!) 30, height 5\' 2"  (1.575 m), weight 83.9 kg (184 lb 15.5 oz), SpO2 98 %.  Intake/Output from previous day: 12/05 0701 - 12/06 0700 In: 665.1 [I.V.:295.1; IV Piggyback:370] Out: 380 [Urine:380]  Physical Exam:  General appearance: sedated on vent Eyes: negative findings: lids and lashes normal Neck: Limited exam due to ET tube Neck: JVP - normal, carotids 2+= without bruits Resp: clear to auscultation bilaterally Chest wall: cooling pads on Cardio: regular rate and rhythm, S1, S2 normal, no murmur, click, rub or gallop and distant heart sounds GI: soft, non-tender; bowel sounds normal; no masses,  no organomegaly Extremities: extremities normal, atraumatic, no cyanosis or edema    Lab Results: BMP  Recent Labs  10/23/16 1400 10/24/16 0300 10/25/16 0330  NA 139 137 136  K 3.6 4.0 4.0  CL 114* 112* 108  CO2 18* 19* 19*  GLUCOSE 89 92 101*  BUN <5* 5* <5*  CREATININE 0.62 0.74 0.78  CALCIUM 7.9* 8.2* 8.7*  GFRNONAA >60 >60 >60  GFRAA >60 >60 >60    CBC  Recent Labs Lab 10/24/16 0300  WBC 5.6  RBC 3.30*  HGB 9.8*  HCT 29.0*  PLT 156  MCV 87.9  MCH 29.7  MCHC 33.8  RDW 13.9    Recent Labs  10/22/16 0832 10/22/16 1445 10/22/16 1800  CKTOTAL 3,820*  --   --   CKMB  81.5*  --   --   TROPONINI 1.19* 0.81* 0.68*  RELINDX 2.1  --   --    Recent Labs  10/22/16 1400 10/23/16 0415 10/25/16 0330  PROT 5.3* 5.3* 6.0*  ALBUMIN 2.8* 2.7* 2.8*  AST 793* 333* 137*  ALT 512* 388* 253*  ALKPHOS 45 42 55  BILITOT 0.4 0.4 0.7  BILIDIR <0.1* 0.1 0.2  IBILI NOT CALCULATED 0.3 0.5    Imaging: Imaging results have been reviewed. No CHF.  Cardiac Studies:  EKG: Diffuse ST depressions, sinus tachycardia and frequent PVCs evident on telemetry. Prolonged QT interval. Tele: PVC. No NSVT  Coronary angiogram 10/21/2016: 1. Normal coronary arteries. 2. Left ventricular systolic function appears to be preserved, EF 50%. Normal LVEDP.  Echocardiogram 10/22/2016: Left ventricle was normal in size, systolic function severely reduced at 25-30% with diffuse hypokinesis.  Mild to moderate tricuspid regurgitation, PA pressure within normal limits.  Right ventricle is normal in size, systolic function moderately reduced.  Recommendation: Patient's presentation may be consistent with probably seizure disorder leading to hypoxemia and A. fib cardiac arrest.    Noncardiac etiology for her presentation need to be evaluated. The LVEF at presentation although was a hand contrast injection, appeared to be at least 45-50% and now the echo revealing severely reduced LVEF is probably post defibrillation, CPR and metabolic issue related.  Also the low EF  may be due to patient being cooled. No clinical e/o CHF with clear lungs and no gallop rhythm on auscultation.   I will add low dose beta blockers as her BP is stable and in fact has been elevated at various times. Will continue to follow.  Adrian Prows, M.D. 10/25/2016, 12:48 PM [facility] Pager: [phone] Office: [phone] If no answer: [phone]

## 2016-10-25 NOTE — Progress Notes (Signed)
SLP Cancellation Note  Patient Details Name: Eyonna Kullberg MRN: [id number] DOB: 1981/08/26   Cancelled treatment:       Reason Eval/Treat Not Completed: Patient not medically ready. Intubated, will sign off.    Jassiel Flye, Katherene Ponto 10/25/2016, 9:32 AM

## 2016-10-25 NOTE — Progress Notes (Signed)
[facility] Progress Note Patient Name: Rachel Duncan DOB: 07-31-1981 MRN: [id number]   Date of Service  10/25/2016  HPI/Events of Note  Patient re-intubated and ventilated. Foley catheter replaced. No order for Foley catheter.   eICU Interventions  Will order: 1. Insert Foley catheter.      Intervention Category Intermediate Interventions: Other:  Lysle Dingwall 10/25/2016, 12:58 AM

## 2016-10-25 NOTE — Progress Notes (Signed)
Per MD order OK to turn off normothermia.

## 2016-10-25 NOTE — Progress Notes (Signed)
Subjective: Became hypercarbic yesterday afternoon, requiring re-intubation.   Exam: Vitals:   10/25/16 0749 10/25/16 0800  BP:  101/69  Pulse: 87 79  Resp: (!) 30 (!) 30  Temp:  97.2 F (36.2 C)   Gen: In bed, intubated Resp: ventilated.  Abd: soft, nt  Neuro: MS: awake, following commands [id number], EOMI Motor: moves all extremities to command.  Sensory:responds to stim x 4.   Pertinent Labs: BMP unremarkable.   Impression: 35 yo F with seizure precipitating cardiac arrest, ? etoh related. She became hypercarbic after extubation yesterday, now re-intubated.   Recommendations: 1) Continue keppra 2) will follow.   Roland Rack, MD [facility] [phone]  If 7pm- 7am, please page neurology on call as listed in [place].

## 2016-10-26 ENCOUNTER — Inpatient Hospital Stay (HOSPITAL_COMMUNITY): Payer: Medicare Other

## 2016-10-26 DIAGNOSIS — R569 Unspecified convulsions: Secondary | ICD-10-CM

## 2016-10-26 LAB — GLUCOSE, CAPILLARY
GLUCOSE-CAPILLARY: 103 mg/dL — AB (ref 65–99)
GLUCOSE-CAPILLARY: 107 mg/dL — AB (ref 65–99)
Glucose-Capillary: 103 mg/dL — ABNORMAL HIGH (ref 65–99)
Glucose-Capillary: 104 mg/dL — ABNORMAL HIGH (ref 65–99)
Glucose-Capillary: 105 mg/dL — ABNORMAL HIGH (ref 65–99)
Glucose-Capillary: 94 mg/dL (ref 65–99)
Glucose-Capillary: 98 mg/dL (ref 65–99)

## 2016-10-26 LAB — CBC
HEMATOCRIT: 23.6 % — AB (ref 36.0–46.0)
Hemoglobin: 7.9 g/dL — ABNORMAL LOW (ref 12.0–15.0)
MCH: 29 pg (ref 26.0–34.0)
MCHC: 33.5 g/dL (ref 30.0–36.0)
MCV: 86.8 fL (ref 78.0–100.0)
PLATELETS: 171 10*3/uL (ref 150–400)
RBC: 2.72 MIL/uL — ABNORMAL LOW (ref 3.87–5.11)
RDW: 13.7 % (ref 11.5–15.5)
WBC: 5.7 10*3/uL (ref 4.0–10.5)

## 2016-10-26 LAB — MAGNESIUM: Magnesium: 1.7 mg/dL (ref 1.7–2.4)

## 2016-10-26 LAB — BASIC METABOLIC PANEL
Anion gap: 10 (ref 5–15)
CHLORIDE: 105 mmol/L (ref 101–111)
CO2: 20 mmol/L — AB (ref 22–32)
CREATININE: 0.87 mg/dL (ref 0.44–1.00)
Calcium: 8.5 mg/dL — ABNORMAL LOW (ref 8.9–10.3)
GFR calc Af Amer: >60 mL/min (ref >60)
GFR calc non Af Amer: >60 mL/min (ref >60)
GLUCOSE: 101 mg/dL — AB (ref 65–99)
Potassium: 3.1 mmol/L — ABNORMAL LOW (ref 3.5–5.1)
Sodium: 135 mmol/L (ref 135–145)

## 2016-10-26 LAB — PHOSPHORUS: Phosphorus: 2.7 mg/dL (ref 2.5–4.6)

## 2016-10-26 MED ORDER — MAGNESIUM SULFATE 2 GM/50ML IV SOLN
2.0000 g | Freq: Once | INTRAVENOUS | Status: AC
  Administered 2016-10-26: 2 g via INTRAVENOUS
  Filled 2016-10-26: qty 50

## 2016-10-26 MED ORDER — POTASSIUM CHLORIDE 20 MEQ/15ML (10%) PO SOLN
30.0000 meq | ORAL | Status: AC
  Administered 2016-10-26 (×2): 30 meq
  Filled 2016-10-26 (×2): qty 30

## 2016-10-26 MED ORDER — METOPROLOL TARTRATE 5 MG/5ML IV SOLN
5.0000 mg | Freq: Four times a day (QID) | INTRAVENOUS | Status: DC
Start: 2016-10-26 — End: 2016-10-27
  Administered 2016-10-26 – 2016-10-27 (×4): 5 mg via INTRAVENOUS
  Filled 2016-10-26 (×4): qty 5

## 2016-10-26 NOTE — Progress Notes (Signed)
Per RN, pt having episodes of apnea alarms.  RN having to go into pt room multiple times to stimulate pt.  Pt placed back on full vent support for now.

## 2016-10-26 NOTE — Progress Notes (Signed)
[facility] Progress Note Patient Name: Rachel Duncan DOB: 02/15/1981 MRN: [id number]   Date of Service  10/26/2016  HPI/Events of Note  Self extubated but tol well  eICU Interventions  Observation / 02 per facemask     Intervention Category Major Interventions: Respiratory failure - evaluation and management  Christinia Gully 10/26/2016, 8:06 PM

## 2016-10-26 NOTE — Progress Notes (Signed)
[facility] ADULT ICU REPLACEMENT PROTOCOL FOR AM LAB REPLACEMENT ONLY  The patient does apply for the [facility] Adult ICU Electrolyte Replacment Protocol based on the criteria listed below:   1. Is GFR >/= 40 ml/min? Yes.    Patient's GFR today is >60 2. Is urine output >/= 0.5 ml/kg/hr for the last 6 hours? Yes.   Patient's UOP is 1.0 ml/kg/hr 3. Is BUN < 60 mg/dL? Yes.    Patient's BUN today is <5 4. Abnormal electrolyte K 3.1, Mg 1.7 5. Ordered repletion with: per protocol 6. If a panic level lab has been reported, has the CCM MD in charge been notified? Yes.  .   Physician:  Illene Labrador, Canary Brim 10/26/2016 5:54 AM

## 2016-10-26 NOTE — Progress Notes (Signed)
Pt placed on CPAP/PS 5/15 by Dr Lamonte Sakai.  Pt is still groggy, RT will continue to monitor for possible extubation.  Sats 100%.

## 2016-10-26 NOTE — Progress Notes (Signed)
Called to patient's room following self-extubation, patient was on non-rebreather with vital signs within normal limits. SpO2 of 100% and RR of 18. No stridor noted, patient is able to say name, and patient's lung sounds were clear, diminished. Will continue to monitor per MD order.

## 2016-10-26 NOTE — Progress Notes (Signed)
Subjective: Continues to be somnolent, MRI pending  Exam: Vitals:   10/26/16 1623 10/26/16 1700  BP: 120/67 117/65  Pulse: 72 71  Resp: 13 12  Temp:     Gen: In bed, intubated Resp: ventilated.  Abd: soft, nt  Neuro: MS: awake, following commands [id number], EOMI Motor: moves all extremities to command.  Sensory:responds to stim x 4.   Pertinent Labs: BMP unremarkable.   Impression: 35 yo F with seizure precipitating cardiac arrest, ? etoh related. She became hypercarbic after extubation yesterday, now re-intubated. She continues to be very interactive, nodding head and shaking head appropriately, but apparently gets sleepy whenever spontaneous breathing trials are initiated.  Recommendations: 1) Continue keppra 2) MRI brain.   Roland Rack, MD [facility] [phone]  If 7pm- 7am, please page neurology on call as listed in [place].

## 2016-10-27 ENCOUNTER — Inpatient Hospital Stay (HOSPITAL_COMMUNITY): Payer: Medicare Other

## 2016-10-27 DIAGNOSIS — G931 Anoxic brain damage, not elsewhere classified: Secondary | ICD-10-CM

## 2016-10-27 LAB — HEPATIC FUNCTION PANEL
ALT: 143 U/L — ABNORMAL HIGH (ref 14–54)
AST: 69 U/L — ABNORMAL HIGH (ref 15–41)
Albumin: 2.8 g/dL — ABNORMAL LOW (ref 3.5–5.0)
Alkaline Phosphatase: 45 U/L (ref 38–126)
BILIRUBIN DIRECT: 0.2 mg/dL (ref 0.1–0.5)
BILIRUBIN INDIRECT: 0.4 mg/dL (ref 0.3–0.9)
TOTAL PROTEIN: 6.3 g/dL — AB (ref 6.5–8.1)
Total Bilirubin: 0.6 mg/dL (ref 0.3–1.2)

## 2016-10-27 LAB — BASIC METABOLIC PANEL
ANION GAP: 10 (ref 5–15)
BUN: <5 mg/dL — ABNORMAL LOW (ref 6–20)
CHLORIDE: 106 mmol/L (ref 101–111)
CO2: 21 mmol/L — AB (ref 22–32)
Calcium: 8.8 mg/dL — ABNORMAL LOW (ref 8.9–10.3)
Creatinine, Ser: 0.81 mg/dL (ref 0.44–1.00)
GFR calc non Af Amer: >60 mL/min (ref >60)
Glucose, Bld: 90 mg/dL (ref 65–99)
Potassium: 4 mmol/L (ref 3.5–5.1)
Sodium: 137 mmol/L (ref 135–145)

## 2016-10-27 LAB — CBC
HCT: 26 % — ABNORMAL LOW (ref 36.0–46.0)
Hemoglobin: 8.6 g/dL — ABNORMAL LOW (ref 12.0–15.0)
MCH: 29.2 pg (ref 26.0–34.0)
MCHC: 33.1 g/dL (ref 30.0–36.0)
MCV: 88.1 fL (ref 78.0–100.0)
PLATELETS: 192 10*3/uL (ref 150–400)
RBC: 2.95 MIL/uL — ABNORMAL LOW (ref 3.87–5.11)
RDW: 13.7 % (ref 11.5–15.5)
WBC: 4.7 10*3/uL (ref 4.0–10.5)

## 2016-10-27 LAB — CULTURE, BLOOD (ROUTINE X 2)
CULTURE: NO GROWTH
Culture: NO GROWTH

## 2016-10-27 LAB — AMMONIA: AMMONIA: 11 umol/L (ref 9–35)

## 2016-10-27 LAB — GLUCOSE, CAPILLARY
GLUCOSE-CAPILLARY: 90 mg/dL (ref 65–99)
GLUCOSE-CAPILLARY: 96 mg/dL (ref 65–99)
GLUCOSE-CAPILLARY: 86 mg/dL (ref 65–99)
Glucose-Capillary: 77 mg/dL (ref 65–99)
Glucose-Capillary: 89 mg/dL (ref 65–99)
Glucose-Capillary: 99 mg/dL (ref 65–99)

## 2016-10-27 LAB — MAGNESIUM: Magnesium: 1.9 mg/dL (ref 1.7–2.4)

## 2016-10-27 LAB — APTT: APTT: 34 s (ref 24–36)

## 2016-10-27 LAB — PHOSPHORUS: PHOSPHORUS: 4.9 mg/dL — AB (ref 2.5–4.6)

## 2016-10-27 MED ORDER — METOPROLOL TARTRATE 5 MG/5ML IV SOLN
7.5000 mg | Freq: Four times a day (QID) | INTRAVENOUS | Status: DC
  Administered 2016-10-27 – 2016-10-28 (×4): 7.5 mg via INTRAVENOUS
  Filled 2016-10-27 (×4): qty 10

## 2016-10-27 MED ORDER — MAGNESIUM SULFATE IN D5W 1-5 GM/100ML-% IV SOLN
1.0000 g | Freq: Once | INTRAVENOUS | Status: AC
  Administered 2016-10-27: 1 g via INTRAVENOUS
  Filled 2016-10-27: qty 100

## 2016-10-27 MED ORDER — RESOURCE THICKENUP CLEAR PO POWD
ORAL | Status: DC | PRN
  Administered 2016-10-28: via ORAL
  Filled 2016-10-27 (×2): qty 125

## 2016-10-27 NOTE — Progress Notes (Signed)
  Pt admitted to the unit. Pt is stable, alert and oriented per baseline. Oriented to room, staff, and call bell. Educated to call for any assistance. Bed in lowest position, call bell within reach- will continue to monitor. 

## 2016-10-27 NOTE — Progress Notes (Signed)
Subjective: Patient was seen and examined this morning. Her main complaint is feeling shaky. She denies fevers, chills, chest pain, shortness of breath. She admits to feeling fatigued.   Exam: Vitals:   10/27/16 0600 10/27/16 0700 10/27/16 0800 10/27/16 0900  BP: 123/73 120/68 127/70 132/81  Pulse: 83 85 98 95  Resp: 14 16 20  (!) 22  Temp:   98.5 F (36.9 C)   TempSrc:   Oral   SpO2: 95% 95% 93% 97%  Weight:      Height:       General: Vital signs reviewed.  Patient is well-developed and well-nourished, in no acute distress and cooperative with exam.  Eyes: PERRLA, conjunctivae normal, no scleral icterus.  Neck: Supple, trachea midline Cardiovascular: RRR, S1 normal, S2 normal, no murmurs, gallops, or rubs. Pulmonary/Chest: Clear to auscultation bilaterally, no wheezes, rales, or rhonchi. Abdominal: Soft, non-tender, non-distended, BS + Extremities: No lower extremity edema bilaterally, pulses symmetric and intact bilaterally. Lower extremities tremulous. Skin: Warm, dry and intact. No rashes or erythema. Neurological: Somnolent, somewhat lethargic, oriented, but obviously with some confusion.  Speech is soft and quiet. Speech is fluent without evidence of aphasia. AA&O x3, cranial nerve II-XII are grossly intact, no focal motor deficit, sensory intact to light touch bilaterally. +Asterixis   Pertinent Labs: BMET 12/8 within normal limits, corrected calcium 10. Elevated liver enzymes- AST 69, ALT 143, improving from prior Hgb 8.6  Pertinent Imaging: MRI 12/8 pending   Impression: Rachel Duncan is a 11 F with PMHx of alcohol abuse who presented to [facility] on [facility]2 after sustaining a witnessed generalized seizures and VFib Cardiac Arrest obtaining ROSC in the field. Patient was intubated, sedated and cooled. Given that her heart catheterization revealed no significant CAD and a preserved EF, her VFib arrest was thought to be due to hypoxia secondary to seizure. Course was complicated by  repeat focal seizures post-cath which resolved after 8 mg of IV ativan and Versed. CT Head showed no acute intracranial abnormality. EEG was without epileptiform focus. Neurology was consulted. Patient was continued on Keppra. Patient is interactive; however, she continued to be very somnolent after spontaneous breathing trials intitiated. Patient self-extubated last night. MRI from this morning is normal.    Recommendations: 1) Continue Keppra 1,000 mg BID 2) Consider possible hepatic encephalopathy given somnolence, lethargy, history of alcohol abuse and asterixis on examination. Could consider abdominal imaging of liver for cirrhosis with abdominal US or CT abdomen/pelvis and an empiric trial of lactulose. Ammonia levels do not coordinate well with acute decompensation of cirrhotics and encephalopathy.   The above was discussed with Dr. Leonel Ramsay, MD, who will leave his formal recommendations.  Martyn Malay, DO PGY-3 Internal Medicine Resident Pager # [phone] 10/27/2016 11:26 AM

## 2016-10-27 NOTE — Progress Notes (Signed)
Nutrition Follow-up  DOCUMENTATION CODES:   Obesity unspecified  INTERVENTION:   Mighty Shake II TID with meals, each supplement provides 480-500 kcals and 20-23 grams of protein   NUTRITION DIAGNOSIS:   Inadequate oral intake related to dysphagia as evidenced by  (diet just advanced). Ongoing.   GOAL:   Patient will meet greater than or equal to 90% of their needs Progressing.   MONITOR:   PO intake, Supplement acceptance, I & O's  ASSESSMENT:   35 y/o woman with hx of ETOH abuse admitted with out of hospital v-fib arrest and likely aspiration, possibly due to seizure activity  12/7 self-extubated Diet just advanced  Diet Order:  DIET DYS 3 Room service appropriate? Yes; Fluid consistency: Nectar Thick  Skin:  Reviewed, no issues  Last BM:  PTA  Height:   Ht Readings from Last 1 Encounters:  10/22/16 5\' 2"  (1.575 m)    Weight:   Wt Readings from Last 1 Encounters:  10/27/16 174 lb 6.1 oz (79.1 kg)    Ideal Body Weight:  50 kg  BMI:  Body mass index is 31.9 kg/m.  Estimated Nutritional Needs:   Kcal:  1800-2000  Protein:  90-100 grams  Fluid:  > 1.8 L/day  EDUCATION NEEDS:   No education needs identified at this time  [place], [place], [place] Pager [phone] After Hours Pager

## 2016-10-27 NOTE — Evaluation (Signed)
Clinical/Bedside Swallow Evaluation Patient Details  Name: Rachel Duncan: [id number] Date of Birth: [birth year]  Today's Date: 10/27/2016 Time: SLP Start Time (ACUTE ONLY): [id number] SLP Stop Time (ACUTE ONLY): 1440 SLP Time Calculation (min) (ACUTE ONLY): 20 min  Past Medical History: History reviewed. No pertinent past medical history. Past Surgical History:  Past Surgical History:  Procedure Laterality Date  . CARDIAC CATHETERIZATION N/A 10/21/2016   Procedure: Left Heart Cath and Coronary Angiography;  Surgeon: Adrian Prows, MD;  Location: [place] [facility];  Service: Cardiovascular;  Laterality: N/A;   HPI:  Rachel Duncan is a 35 y/o woman with history of previous alcohol abuse who was brought to [facility] via EMS after a cardiac arrest at home. Her mother reports that Rachel Duncan was sitting and looking at her phone after finishing dinner. She suddenly fell and arched back appearing stiff. The family called 20 and CPR was started by her brother until EMS arrived. When EMS arrived she was found to be in V-fib. She received a total of 6 shocks and 1 round of epinephrine before return of spontaneous circulation. In the ED it was noted that her oropharynx was full of debris. Per ED report she was not following commands prior to intubation. From the ED she was taken to the cath lab where she was found to have no significant coronary artery disease and a preserved EF. She was then transferred to the ICU. Upon my arrival to the ICU she appeared to be actively seizing. She was given a total of 8mg  ativan with resolution of seizure activity. Intubated from 12/2 to 12/5, reintubated, self extubated on 12/7.    Assessment / Plan / Recommendation Clinical Impression  Pt demonstrated signs of an acute reversible dysphagia following intubation including imemdiate and delayed coughing with thin liquids when given in 3 oz water test. Offered trials of nectar thick liquids, pureed solids and regular solids,  all tolerated well. Will initiate a dys 3 (mech soft) diet with nectar thick liquids, meds whole in puree. SLP will f/u for tolerance and upgrade.     Aspiration Risk  Moderate aspiration risk    Diet Recommendation Dysphagia 3 (Mech soft);Nectar-thick liquid   Medication Administration: Whole meds with liquid Supervision: Staff to assist with self feeding Compensations: Slow rate;Small sips/bites Postural Changes: Seated upright at 90 degrees    Other  Recommendations Oral Care Recommendations: Oral care BID Other Recommendations: Order thickener from pharmacy   Follow up Recommendations 24 hour supervision/assistance      Frequency and Duration min 2x/week  2 weeks       Prognosis Prognosis for Safe Diet Advancement: Good      Swallow Study   General HPI: Rachel Duncan is a 35 y/o woman with history of previous alcohol abuse who was brought to [facility] via EMS after a cardiac arrest at home. Her mother reports that Rachel Duncan was sitting and looking at her phone after finishing dinner. She suddenly fell and arched back appearing stiff. The family called 49 and CPR was started by her brother until EMS arrived. When EMS arrived she was found to be in V-fib. She received a total of 6 shocks and 1 round of epinephrine before return of spontaneous circulation. In the ED it was noted that her oropharynx was full of debris. Per ED report she was not following commands prior to intubation. From the ED she was taken to the cath lab where she was found to have no significant coronary artery disease and  a preserved EF. She was then transferred to the ICU. Upon my arrival to the ICU she appeared to be actively seizing. She was given a total of 8mg  ativan with resolution of seizure activity. Intubated from 12/2 to 12/5, reintubated, self extubated on 12/7.  Type of Study: Bedside Swallow Evaluation Diet Prior to this Study: NPO Temperature Spikes Noted: No Respiratory Status: Nasal  cannula History of Recent Intubation: No Behavior/Cognition: Alert;Cooperative Oral Cavity Assessment: Within Functional Limits Oral Care Completed by SLP: No Oral Cavity - Dentition: Poor condition Vision: Functional for self-feeding Self-Feeding Abilities: Needs assist Patient Positioning: Upright in bed Baseline Vocal Quality: Low vocal intensity Volitional Cough: Strong Volitional Swallow: Able to elicit    Oral/Motor/Sensory Function     Ice Chips     Thin Liquid Thin Liquid: Impaired Presentation: Cup;Straw;Self Fed Pharyngeal  Phase Impairments: Cough - Immediate;Cough - Delayed    Nectar Thick Nectar Thick Liquid: Within functional limits   Honey Thick Honey Thick Liquid: Not tested   Puree Puree: Within functional limits   Solid   GO   Solid: Within functional limits       [facility], MA CCC-SLP [phone]  Rachel Duncan 10/27/2016,3:02 PM

## 2016-10-27 NOTE — Progress Notes (Signed)
PULMONARY / CRITICAL CARE MEDICINE   Name: Rachel Duncan MRN: [id number] DOB: [birth year]    ADMISSION DATE:  10/21/2016 CONSULTATION DATE:  10/21/16  REFERRING MD:  ED physician  CHIEF COMPLAINT:  V-fib arrest  Brief:   Rachel Duncan is a 35 y/o woman with history of previous alcohol abuse who was brought to [facility] via EMS after a cardiac arrest at home.  Her mother reports that Rachel Duncan was sitting and looking at her phone after finishing dinner.  She suddenly fell and arched back appearing stiff.  The family called 31 and CPR was started by her brother until EMS arrived.  When EMS arrived she was found to be in V-fib.  She received a total of 6 shocks and 1 round of epinephrine before return of spontaneous circulation.  In the ED it was noted that her oropharynx was full of debris. Per ED report she was not following commands prior to intubation.  From the ED she was taken to the cath lab where she was found to have no significant coronary artery disease and a preserved EF.  She was then transferred to the ICU.  Upon my arrival to the ICU she appeared to be actively seizing.  She was given a total of 8mg  ativan with resolution of seizure activity.   SUBJECTIVE:  Self-extubated last night. Off sedation.   VITAL SIGNS: BP 127/70 (BP Location: Right Arm)   Pulse 98   Temp 98.5 F (36.9 C) (Oral)   Resp 20   Ht 5\' 2"  (1.575 m)   Wt 79.1 kg (174 lb 6.1 oz)   LMP  (LMP Unknown)   SpO2 93%   BMI 31.90 kg/m   HEMODYNAMICS:    VENTILATOR SETTINGS: Vent Mode: PRVC FiO2 (%):  [40 %-70 %] 70 % Set Rate:  [14 bmp-30 bmp] 14 bmp Vt Set:  [400 mL] 400 mL PEEP:  [5 cmH20] 5 cmH20 Pressure Support:  [15 cmH20] 15 cmH20 Plateau Pressure:  [11 cmH20-17 cmH20] 14 cmH20  INTAKE / OUTPUT: I/O last 3 completed shifts: In: 1944.1 [I.V.:1424.1; IV Piggyback:520] Out: 2475 [Urine:2475]  PHYSICAL EXAMINATION: General: Adult female, on nasal cannula, no distress  Neuro:  Lethargic, follows  commands, moves all extremities, pupils 51mm brisk   HEENT:  ETT in place Cardiovascular:  RRR, no MRG Lungs:  Unlabored, diminished at bases, on nasal cannula  Abdomen: non-distended, non-tender, hypoactive bowel sounds Musculoskeletal:  No edema, no joint abnormalities Skin:  Bruise on forehead, no rashes or other lesions.   LABS:  BMET  Recent Labs Lab 10/25/16 0330 10/26/16 0300 10/27/16 0400  NA 136 135 137  K 4.0 3.1* 4.0  CL 108 105 106  CO2 19* 20* 21*  BUN <5* <5* <5*  CREATININE 0.78 0.87 0.81  GLUCOSE 101* 101* 90    Electrolytes  Recent Labs Lab 10/23/16 0415  10/24/16 0300 10/25/16 0330 10/26/16 0300 10/27/16 0400  CALCIUM 7.8*  < > 8.2* 8.7* 8.5* 8.8*  MG 2.0  --  1.7 2.1 1.7 1.9  PHOS 1.1*  --  3.2  --  2.7  --   < > = values in this interval not displayed.  CBC  Recent Labs Lab 10/23/16 0415 10/23/16 0810 10/24/16 0300 10/26/16 0300  WBC 2.9*  --  5.6 5.7  HGB 9.9* 10.2* 9.8* 7.9*  HCT 28.5* 30.0* 29.0* 23.6*  PLT 167  --  156 171    Coag's  Recent Labs Lab 10/22/16 0200 10/22/16 1036 10/24/16 0300  APTT 21* 29  --   INR 1.28 1.23 1.28    Sepsis Markers  Recent Labs Lab 10/22/16 1035 10/22/16 1700 10/24/16 0300  LATICACIDVEN 2.0* 1.6 0.6    ABG  Recent Labs Lab 10/24/16 2028 10/24/16 2133 10/24/16 2314  PHART 7.140* 7.202* 7.310*  PCO2ART 59.1* 50.4* 38.8  PO2ART 89.0 129.0* 129.0*    Liver Enzymes  Recent Labs Lab 10/22/16 1400 10/23/16 0415 10/25/16 0330  AST 793* 333* 137*  ALT 512* 388* 253*  ALKPHOS 45 42 55  BILITOT 0.4 0.4 0.7  ALBUMIN 2.8* 2.7* 2.8*    Cardiac Enzymes  Recent Labs Lab 10/22/16 0832 10/22/16 1445 10/22/16 1800  TROPONINI 1.19* 0.81* 0.68*    Glucose  Recent Labs Lab 10/26/16 1143 10/26/16 1628 10/26/16 2046 10/27/16 0000 10/27/16 0354 10/27/16 0759  GLUCAP 104* 107* 105* 103* 90 96    Imaging Dg Chest Port 1 View  Result Date: 10/26/2016 CLINICAL DATA:   Intubation. EXAM: PORTABLE CHEST 1 VIEW COMPARISON:  10/26/2016. FINDINGS: Endotracheal tube, NG tube, right subclavian line in good anatomic position. Cardiomegaly with normal pulmonary vascularity. Low lung volumes with partial clearing of bibasilar atelectasis. No pleural effusion or pneumothorax . IMPRESSION: 1. Lines and tubes in stable position. 2.  Low lung volumes with partial clearing of bibasilar atelectasis. Electronically Signed   By: Marcello Moores  Register   On: 10/26/2016 13:52     STUDIES:  Head CT 10/21/16 - no acute findings Cardiac Cath 10/21/16 - clean coronaries, preserved EF.  EEG 12/3 > no epileptiform focus MRI Brain 12/7 >>   CULTURES: Urine 12/3 >neg Blood 12/3 >neg MRSA 12/3 > negative Trach 12/3 > neg   ANTIBIOTICS: Rocephin 12/3 >   SIGNIFICANT EVENTS: 12/2 admit Vfib arrest with sze. N coronaries. TTM.  12/5 extubated > re-intubated secondary to lethargy  12/8 self extubated    LINES/TUBES: ETT 10/21/16 >>12/5 ETT 12/5 >>12/8 Right subclavian TLC 10/21/16 >>   DISCUSSION: 35 y/o woman with out of hospital seizure and then v-fib arrest and likely aspiration. Hypothermia initiated.  L cath clean. Reintubated 12/5 for lethargy and poor airway protection.   ASSESSMENT / PLAN:  PULMONARY A: Acute hypoxemic resp failure 2/2 unable to protect airway asp pna P:   Supplemental oxygen to maintain saturation >92 VAP prevention IS, flutter valve   CARDIOVASCULAR A:  V-fib arrest. S/P L cath 12/3 with Normal coronaries and EF P:  Appreciate cards assistance.  Tele  Monitoring  Maintain MAP >65  RENAL A:   Metabolic acidosis - improved  Hypomagnesium  P:   Follow BMP Replace electrolytes as needed   ENDOCRINE A:   Hypoglycemia   P:   Glucose checks Q4H D5 at 20 ml/hr   NEUROLOGIC A:   Seizure, post arrest; EEG 12/3 > no epileptiform focus Concern for Anoxic ischemic encephalopathy 2/2 arrest, but good MS on 12/5 am wakeup assessment H/o ETOH  use and last drink was Thanksgiving.  ETOH level on admission was 6. P:   RASS goal: 0  Neurology following Cont Keppra Giving daily B6 as pyridoxine x 5 days as deficiency has been reported as a cause of seizure in chronic alcoholics.  D/C sedation   MRI brain pending   INFECTIOUS A:  Asp PNA Possible UTI P: Complete 7 day course of  Rocephin (day 5)   Follow CXR  Trend Fever and WBC curve   GASTROENTEROLOGY A:  Transaminitis, likely shock liver, improving Dysphagia post intubation  P: Trend LFTs Send  ammonia  Speech Therapy ordered - if fails will place cortrack   PROPHYLAXIS : heparin sq   FAMILY  - Updates: Family updated at bedside by Dr. Corrie Dandy on 12/3.  Spoke to mother and rest of family. Mother makes decision. Dr Lamonte Sakai spoke with family at bedside 12/5.  - Inter-disciplinary family meet or Palliative Care meeting due by:  10/28/16  Hayden Pedro, AG-ACNP [facility]  Pgr: [phone]  PCCM Pgr: [phone]  Attending Note:  I have examined patient, reviewed labs, studies and notes. I have discussed the case with Beaulah Corin, and I agree with the data and plans as amended above. 35 yo woman with cardiac arrest following refractory seizures (her first). I lightened sedation 12/7 and she self extubated last night. She has tolerated well. Her voice is a bit weak, has some difficulty w secretions but she does manage to clear them. She answers questions and follows commands. Lungs are clear, HR 80's, no M. Will push pulmonary hygiene and mobilization. She will undergo MRI Brain today, appreciate Neurology assistance. Continue Keppra. I suspect she will continue this long term, will defer this to neuro. Complete 2 more days abx for possible PNA. Swallowing eval to assess when she can take PO. We will transfer to floor bed, telemetry. ? Whether she will need a rehab stay. Will depend on the pace of her recovery. Will ask TRH to assume her care on  12/9.  Independent critical care time is 31 minutes.   Baltazar Apo, MD, PhD 10/27/2016, 11:14 AM [facility] [phone] or if no answer [phone]

## 2016-10-27 NOTE — Progress Notes (Signed)
Receiving RN made this RN aware that patient's foley was d/c at almost 2pm this afternoon. Patient is eating at this time and states that she is not ready to void but that she will call. Night RN will be notified of this.

## 2016-10-27 NOTE — Procedures (Signed)
Indication: to evaluate for source of seizure  Risks of the procedure were dicussed with the patient including post-LP headache, bleeding, infection, weakness/numbness of legs(radiculopathy), death.  The patient agreed and written consent was obtained.   The patient was prepped and draped, and using sterile technique a 20 gauge quinke spinal needle was inserted in the l4/5 space. No CSF was obtained due to difficult tap as no land marks were able to be felt. This will further need to be done under flouro.   Etta Quill PA-C [facility] [phone]  M-F  (8:30 am- 4 PM)  10/27/2016, 3:24 PM

## 2016-10-27 NOTE — Progress Notes (Signed)
Subjective:  Self extubated last night. C/O lip pain due to lip bite during seizure. No chest pain or dyspnea.   Objective:  Vital Signs in the last 24 hours: Temp:  [98.5 F (36.9 C)-98.8 F (37.1 C)] 98.8 F (37.1 C) (12/08 1148) Pulse Rate:  [62-98] 86 (12/08 1205) Resp:  [10-23] 23 (12/08 1205) BP: (108-132)/(55-81) 123/79 (12/08 1205) SpO2:  [93 %-100 %] 94 % (12/08 1228) FiO2 (%):  [40 %-70 %] 70 % (12/07 1921) Weight:  [79.1 kg (174 lb 6.1 oz)] 79.1 kg (174 lb 6.1 oz) (12/08 0300) Blood pressure (!) 101/59, pulse 95, temperature 98.8 F (37.1 C), temperature source Oral, resp. rate (!) 30, height 5\' 2"  (1.575 m), weight 83.9 kg (184 lb 15.5 oz), SpO2 98 %.  Intake/Output from previous day: 12/07 0701 - 12/08 0700 In: 943.5 [I.V.:633.5; IV Piggyback:310] Out: [id number] [Urine:1675]  Physical Exam:  General appearance: Drowsy but easily arousable and cooperative Eyes: negative findings: lids and lashes normal Neck: No JVD.  Carotids 2+= without bruits Resp: clear to auscultation bilaterally Chest wall: Normal Cardio: regular rate and rhythm, S1, S2 normal, no murmur, click, rub or gallop GI: soft, non-tender; bowel sounds normal; no masses,  no organomegaly Extremities: extremities normal, atraumatic, no cyanosis or edema  Lab Results: BMP  Recent Labs  10/25/16 0330 10/26/16 0300 10/27/16 0400  NA 136 135 137  K 4.0 3.1* 4.0  CL 108 105 106  CO2 19* 20* 21*  GLUCOSE 101* 101* 90  BUN <5* <5* <5*  CREATININE 0.78 0.87 0.81  CALCIUM 8.7* 8.5* 8.8*  GFRNONAA >60 >60 >60  GFRAA >60 >60 >60   CBC Latest Ref Rng & Units 10/27/2016 10/26/2016 10/24/2016  WBC 4.0 - 10.5 K/uL 4.7 5.7 5.6  Hemoglobin 12.0 - 15.0 g/dL 8.6(L) 7.9(L) 9.8(L)  Hematocrit 36.0 - 46.0 % 26.0(L) 23.6(L) 29.0(L)  Platelets 150 - 400 K/uL 192 171 156   Recent Labs  10/22/16 0832 10/22/16 1445 10/22/16 1800  CKTOTAL 3,820*  --   --   CKMB 81.5*  --   --   TROPONINI 1.19* 0.81* 0.68*   RELINDX 2.1  --   --     Recent Labs  10/23/16 0415 10/25/16 0330 10/27/16 0900  PROT 5.3* 6.0* 6.3*  ALBUMIN 2.7* 2.8* 2.8*  AST 333* 137* 69*  ALT 388* 253* 143*  ALKPHOS 42 55 45  BILITOT 0.4 0.7 0.6  BILIDIR 0.1 0.2 0.2  IBILI 0.3 0.5 0.4    Imaging: Imaging results have been reviewed. No CHF.  Cardiac Studies:  EKG: Diffuse ST depressions, sinus tachycardia and frequent PVCs evident on telemetry. Prolonged QT interval. Tele: PVC. No NSVT  Coronary angiogram 10/21/2016: 1. Normal coronary arteries. 2. Left ventricular systolic function appears to be preserved, EF 50%. Normal LVEDP.  Echocardiogram 10/22/2016: Left ventricle was normal in size, systolic function severely reduced at 25-30% with diffuse hypokinesis.  Mild to moderate tricuspid regurgitation, PA pressure within normal limits.  Right ventricle is normal in size, systolic function moderately reduced.  Impression: 1.  V. fib arrest status post successful resuscitation.  Has undergone [employer] now alert oriented. 2.  Etiology for A. fib that required defibrillation including but not limited to seizure disorder leading to severe hypoxemia versus agnogenic which is less likely.  Questionable alcohol abuse history.  Details not fully available. 3.  Severe LV systolic function/cardiomyopathy post resuscitation by echocardiogram on 10/22/2016, however LV function although not performed in a formal fashion during coronary  angiography, EF at that time appeared to be at least 45% to 50%.   Recommendation: Patient's presentation may be consistent with probably seizure disorder leading to hypoxemia and A. fib cardiac arrest.    Noncardiac etiology for her presentation need to be evaluated.  No clinical e/o CHF with clear lungs and no gallop rhythm on auscultation. Patient is laying down in bed flat without any distress  And orthopnea. She is presently tolerating IV metoprolol, will increase the dose to 7.5 mg every  6 hours.  Would recommend performing a transthoracic limited echocardiogram just prior to discharge to reevaluate LV systolic function.  If the ejection fraction is improved, she can be discharged on beta blocker therapy.  However if the EF is low, consider placement of a LifeVest and further assessment of LV function in 4-12 weeks with appropriate medical therapy for CHF and cardiomyopathy.    [place] the next one week, Dr.  Doylene Canard will follow the patient.  Adrian Prows, M.D. 10/27/2016, [place] [facility] [place] Pager: [phone] Office: [phone] If no answer: [phone]

## 2016-10-28 ENCOUNTER — Inpatient Hospital Stay (HOSPITAL_COMMUNITY): Payer: Medicare Other

## 2016-10-28 LAB — CBC
HCT: 29 % — ABNORMAL LOW (ref 36.0–46.0)
Hemoglobin: 9.6 g/dL — ABNORMAL LOW (ref 12.0–15.0)
MCH: 29 pg (ref 26.0–34.0)
MCHC: 33.1 g/dL (ref 30.0–36.0)
MCV: 87.6 fL (ref 78.0–100.0)
Platelets: 217 10*3/uL (ref 150–400)
RBC: 3.31 MIL/uL — ABNORMAL LOW (ref 3.87–5.11)
RDW: 13.6 % (ref 11.5–15.5)
WBC: 3.9 10*3/uL — ABNORMAL LOW (ref 4.0–10.5)

## 2016-10-28 LAB — CSF CELL COUNT WITH DIFFERENTIAL
RBC COUNT CSF: 344 /mm3 — AB
TUBE #: 1
WBC, CSF: 2 /mm3 (ref 0–5)

## 2016-10-28 LAB — GLUCOSE, CAPILLARY
GLUCOSE-CAPILLARY: 94 mg/dL (ref 65–99)
GLUCOSE-CAPILLARY: 89 mg/dL (ref 65–99)
Glucose-Capillary: 89 mg/dL (ref 65–99)
Glucose-Capillary: 88 mg/dL (ref 65–99)

## 2016-10-28 LAB — BASIC METABOLIC PANEL
Anion gap: 10 (ref 5–15)
BUN: 7 mg/dL (ref 6–20)
CHLORIDE: 102 mmol/L (ref 101–111)
CO2: 24 mmol/L (ref 22–32)
CREATININE: 0.79 mg/dL (ref 0.44–1.00)
Calcium: 9 mg/dL (ref 8.9–10.3)
GFR calc Af Amer: >60 mL/min (ref >60)
GFR calc non Af Amer: >60 mL/min (ref >60)
GLUCOSE: 97 mg/dL (ref 65–99)
Potassium: 4 mmol/L (ref 3.5–5.1)
SODIUM: 136 mmol/L (ref 135–145)

## 2016-10-28 LAB — MAGNESIUM: Magnesium: 2 mg/dL (ref 1.7–2.4)

## 2016-10-28 LAB — PREGNANCY, URINE: Preg Test, Ur: NEGATIVE

## 2016-10-28 LAB — PHOSPHORUS: Phosphorus: 4.3 mg/dL (ref 2.5–4.6)

## 2016-10-28 LAB — GLUCOSE, CSF: GLUCOSE CSF: 60 mg/dL (ref 40–70)

## 2016-10-28 LAB — PROTEIN, CSF: TOTAL PROTEIN, CSF: 15 mg/dL (ref 15–45)

## 2016-10-28 MED ORDER — LEVETIRACETAM 500 MG PO TABS
1000.0000 mg | ORAL_TABLET | Freq: Two times a day (BID) | ORAL | Status: DC
Start: 2016-10-28 — End: 2016-10-28

## 2016-10-28 MED ORDER — LIDOCAINE HCL 1 % IJ SOLN
INTRAMUSCULAR | Status: AC
  Filled 2016-10-28: qty 10

## 2016-10-28 MED ORDER — ALPRAZOLAM 0.5 MG PO TABS
0.5000 mg | ORAL_TABLET | Freq: Once | ORAL | Status: AC
  Administered 2016-10-28: 0.5 mg via ORAL
  Filled 2016-10-28: qty 1

## 2016-10-28 MED ORDER — METOPROLOL TARTRATE 25 MG PO TABS
25.0000 mg | ORAL_TABLET | Freq: Two times a day (BID) | ORAL | Status: DC
  Administered 2016-10-28 – 2016-10-29 (×2): 25 mg via ORAL
  Filled 2016-10-28 (×2): qty 1

## 2016-10-28 MED ORDER — ORAL CARE MOUTH RINSE
15.0000 mL | Freq: Two times a day (BID) | OROMUCOSAL | Status: DC
  Administered 2016-10-28 – 2016-10-31 (×5): 15 mL via OROMUCOSAL

## 2016-10-28 MED ORDER — INFLUENZA VAC SPLIT QUAD 0.5 ML IM SUSY
0.5000 mL | PREFILLED_SYRINGE | INTRAMUSCULAR | Status: DC
  Filled 2016-10-28: qty 0.5

## 2016-10-28 NOTE — Progress Notes (Signed)
Charge nurse asked to assist RN with patient.  Patient back from LP and complaining of heaviness and shaking in bilateral lower extremities.  Dr Katherine Roan notified he stated he would enter stat mri of lumber spine.  Patient's nurse Gage Hail notified of this.

## 2016-10-28 NOTE — Progress Notes (Addendum)
Subjective: Much improved today, family reports that her confusion seems to be almost completely resolved.  She had a lumbar puncture earlier today, she complains of bilateral leg heaviness since that time, though it may be improving slightly.  Exam: Vitals:   10/28/16 0654 10/28/16 1500  BP: 120/87 124/80  Pulse: 71 70  Resp: 15 19  Temp:  97.8 F (36.6 C)   Gen: In bed, NAD Resp: non-labored breathing, no acute distress Abd: soft, nt  Neuro: MS: Awake, alert, interactive and appropriate CN: Physical round and reactive to light, EOMI Motor: Moves all extremities well, specifically she has no focal weakness in either of her lower extremities. Sensory: She has no numbness in her lower extremities  Pertinent Labs: CSF WBC-3 CSF protein 15  Impression: 35 year old female with new onset seizure in the setting of cardiac arrest. It is unclear to me which came first. In this setting, I do not typically recommend long-term antiepileptics without any seizure history or any clear predisposition. It is not clear to me that this was related to alcohol in any way. Certainly if she did have hypoperfusion(mother describes she went limp first, then became stiff) then seizures sometimes can result, though not common.  Not sure about her lower extremity symptoms, but she does not appear to have any focal weakness or numbness. If she were to develop further symptoms, or worsen, then an MRI of her lumbar spine to ensure she doesn't have an expanding hematoma would be appropriate.  Recommendations: 1) discontinue Keppra 2) I do not think I have any further workup to offer for the etiology of this event. 3) she will not be able to drive for 6 months.  Roland Rack, MD [facility] [phone]  If 7pm- 7am, please page neurology on call as listed in [place].

## 2016-10-28 NOTE — Progress Notes (Signed)
Dr. Hal Hope notified of the pt's intermittent tremors of the lower extremities beginning today after lumbar puncture. Orders to give oral xanax 0.5mg   one time dose and MRI of spine. Will continue to monitor pt.

## 2016-10-28 NOTE — Progress Notes (Signed)
Subjective:  Patient is alert awake denies any chest pain or shortness of breath. Remains in sinus rhythm.  Objective:  Vital Signs in the last 24 hours: Temp:  [98.5 F (36.9 C)-99.2 F (37.3 C)] 98.9 F (37.2 C) (12/09 0426) Pulse Rate:  [71-93] 71 (12/09 0654) Resp:  [13-24] 15 (12/09 0654) BP: (114-131)/(72-87) 120/87 (12/09 0654) SpO2:  [91 %-100 %] 97 % (12/09 0654) Weight:  [166 lb 3.2 oz (75.4 kg)] 166 lb 3.2 oz (75.4 kg) (12/09 0426)  Intake/Output from previous day: 12/08 0701 - 12/09 0700 In: 160 [P.O.:120; I.V.:40] Out: 600 [Urine:600] Intake/Output from this shift: Total I/O In: 30 [P.O.:30] Out: 300 [Urine:300]  Physical Exam: Neck: no adenopathy, no carotid bruit, no JVD and supple, symmetrical, trachea midline Lungs: Decreased breath sound at bases Heart: regular rate and rhythm, S1, S2 normal and Soft systolic murmur noted no S3 gallop Abdomen: soft, non-tender; bowel sounds normal; no masses,  no organomegaly Extremities: extremities normal, atraumatic, no cyanosis or edema  Lab Results:  Recent Labs  10/27/16 0900 10/28/16 0435  WBC 4.7 3.9*  HGB 8.6* 9.6*  PLT 192 217    Recent Labs  10/27/16 0400 10/28/16 0435  NA 137 136  K 4.0 4.0  CL 106 102  CO2 21* 24  GLUCOSE 90 97  BUN <5* 7  CREATININE 0.81 0.79   No results for input(s): TROPONINI in the last 72 hours.  Invalid input(s): CK, MB Hepatic Function Panel  Recent Labs  10/27/16 0900  PROT 6.3*  ALBUMIN 2.8*  AST 69*  ALT 143*  ALKPHOS 45  BILITOT 0.6  BILIDIR 0.2  IBILI 0.4   No results for input(s): CHOL in the last 72 hours. No results for input(s): PROTIME in the last 72 hours.  Imaging: Imaging results have been reviewed and Mr Brain Wo Contrast  Result Date: 10/27/2016 CLINICAL DATA:  Seizure EXAM: MRI HEAD WITHOUT CONTRAST TECHNIQUE: Multiplanar, multiecho pulse sequences of the brain and surrounding structures were obtained without intravenous contrast.  COMPARISON:  CT head 10/22/2016 FINDINGS: Brain: Ventricle size and cerebral volume normal. Negative for acute or chronic infarction. White matter normal. Brainstem and cerebellum normal. No intracranial hemorrhage or mass. No shift of the midline structures. No structural abnormality. Vascular: Normal arterial flow voids. Skull and upper cervical spine: Negative Sinuses/Orbits: Mucosal edema throughout the paranasal sinuses. Extensive mucosal edema and air-fluid level in the sphenoid sinus. Mild mastoid sinus effusion bilaterally. Other: None IMPRESSION: Normal unenhanced MRI of the brain Extensive sinus mucosal disease most notably in the sphenoid sinus. Air-fluid level in the sphenoid sinus. Electronically Signed   By: Franchot Gallo M.D.   On: 10/27/2016 11:23   Dg Chest Port 1 View  Result Date: 10/26/2016 CLINICAL DATA:  Intubation. EXAM: PORTABLE CHEST 1 VIEW COMPARISON:  10/26/2016. FINDINGS: Endotracheal tube, NG tube, right subclavian line in good anatomic position. Cardiomegaly with normal pulmonary vascularity. Low lung volumes with partial clearing of bibasilar atelectasis. No pleural effusion or pneumothorax . IMPRESSION: 1. Lines and tubes in stable position. 2.  Low lung volumes with partial clearing of bibasilar atelectasis. Electronically Signed   By: Marcello Moores  Register   On: 10/26/2016 13:52    Cardiac Studies:  Assessment/Plan:  Status post out of hospital V. fib cardiac arrest Seizure disorder Status post hypoxic respiratory failure EtOH abuse Tobacco abuse Resolving anoxic encephalopathy Plan Change IV metoprolol to by mouth as per orders We will recheck 2-D echo in a few days Incentive spirometry  Dr.  Doylene Canard will follow from Monday  LOS: 6 days    Charolette Forward 10/28/2016, 12:54 PM

## 2016-10-28 NOTE — Progress Notes (Signed)
PT Cancellation Note  Patient Details Name: Rachel Duncan MRN: [id number] DOB: [birth year]   Cancelled Treatment:    Reason Eval/Treat Not Completed: Medical issues which prohibited therapy;Other (comment) (had lumbar puncture this afternoon, on bedrest until after 6)  Will return next day as able to complete evaluation.  [place], PT [phone]  [place] 10/28/2016, 4:46 PM

## 2016-10-28 NOTE — Progress Notes (Addendum)
PROGRESS NOTE    Rachel Duncan  UDJ:[id number] DOB: 1981-06-01 DOA: 10/21/2016 PCP: No primary care provider on file.     Brief Narrative:  Rachel Duncan is a 35 year old female with history of alcohol abuse who was brought in to [facility] ED via EMS after cardiac arrest at home. Apparently, patient was looking at her phone after finishing dinner when she suddenly fell, arched back appearing stiff. Upon EMS arrival, patient was found to be in V. fib. She received total of 6 shocks and one round of epinephrine and achieved return of spontaneous circulation. Patient was intubated, taken to cath Lab where she was found to have no significant coronary artery disease. She also had seizures when she arrived in the ICU. Etiology of her arrest could be due to seizure leading to arrhythmia or arrhythmia leading to hypoperfusion and seizure. Neurology, cardiology have been following patient.    Assessment & Plan:   Principal Problem:   Cardiac arrest [facility] Active Problems:   Acute respiratory failure with hypoxia (HCC)   Acute encephalopathy   Anoxic encephalopathy (HCC)   Seizure (HCC)  V. fib arrest -Heart cath on 12/2 with normal coronary arteries and preserved EF -Received cooling protocol while in ICU -Appreciate cardiology, patient needs TTE limited prior to discharge to re-evaluate LV systolic function -Continue metoprolol BID   Seizure -Neurology following -EEG 12/4: EEG is markedly abnormal due to diffuse background suppression and lack of EEG reactivity.  There was no epileptiform activity on this recording, but this does not rule out seizure disorder. -MRI brain: unremarkable -Keppra 1060m BID   Acute encephalopathy  -Likely secondary to prolonged arrest -LP 12/8: difficult tap, fluoro LP ordered   Acute hypoxemic respiratory failure -Secondary to inability to protect airway, aspiration  -Intubated on admission, extubated 12/5, re-intubated 12/5, and self extubated  12/7 -Continue [place] O2, wean as able   Pyuria -Leukocytes and nitrite in Ua, but only 6-30 WBC. Urine culture not available.  -Received 7 day course rocephin  Elevated liver enzymes -Likely due to arrest, shock liver -Trend LFT, improving with supportive care   History of alcohol abuse -Last alcoholic drink was Thanksgiving   DVT prophylaxis: SCDs Code Status: Full Family Communication: no family at bedside Disposition Plan: pending further improvement. PT OT to eval    Consultants:   PCCM  Cardiology  Neurology   Procedures:  -Heart cath on 12/2 with normal coronary arteries and preserved EF -EEG 12/4: EEG is markedly abnormal due to diffuse background suppression and lack of EEG reactivity.  There was no epileptiform activity on this recording, but this does not rule out seizure disorder.  Antimicrobials:  Anti-infectives    Start     Dose/Rate Route Frequency Ordered Stop   10/22/16 0500  cefTRIAXone (ROCEPHIN) 2 g in dextrose 5 % 50 mL IVPB     2 g 100 mL/hr over 30 Minutes Intravenous Daily 10/22/16 0436 10/28/16 0623       Subjective: She has no complaints today. She denies any chest pain, shortness of breath, nausea, vomiting, diarrhea, abdominal pain.    Objective: Vitals:   10/28/16 0014 10/28/16 0426 10/28/16 0556 10/28/16 0654  BP: 131/81   120/87  Pulse: 93 80 73 71  Resp: (!) _0 Temp:  98.9 F (37.2 C)    TempSrc:  Oral    SpO2: 94% 93%  97%  Weight:  75.4 kg (166 lb 3.2 oz)    Height:  Intake/Output Summary (Last 24 hours) at 10/28/16 1313 Last data filed at 10/28/16 1200  Gross per 24 hour  Intake              150 ml  Output              500 ml  Net             -350 ml   Filed Weights   10/26/16 0411 10/27/16 0300 10/28/16 0426  Weight: 83.1 kg (183 lb 3.2 oz) 79.1 kg (174 lb 6.1 oz) 75.4 kg (166 lb 3.2 oz)    Examination:  General exam: Appears calm and comfortable  Respiratory system: Clear to auscultation.  Respiratory effort normal. Cardiovascular system: S1 & S2 heard, RRR. No JVD, murmurs, rubs, gallops or clicks. No pedal edema. Gastrointestinal system: Abdomen is nondistended, soft and nontender. No organomegaly or masses felt. Normal bowel sounds heard. Central nervous system: Alert and oriented. No focal neurological deficits. Extremities: Symmetric 5 x 5 power. Skin: No rashes, lesions or ulcers Psychiatry: Judgement and insight appear normal. Mood & affect appropriate.   Data Reviewed: I have personally reviewed following labs and imaging studies  CBC:  Recent Labs Lab 10/23/16 0415 10/23/16 0810 10/24/16 0300 10/26/16 0300 10/27/16 0900 10/28/16 0435  WBC 2.9*  --  5.6 5.7 4.7 3.9*  HGB 9.9* 10.2* 9.8* 7.9* 8.6* 9.6*  HCT 28.5* 30.0* 29.0* 23.6* 26.0* 29.0*  MCV 85.8  --  87.9 86.8 88.1 87.6  PLT 167  --  156 171 192 426   Basic Metabolic Panel:  Recent Labs Lab 10/23/16 0415  10/24/16 0300 10/25/16 0330 10/26/16 0300 10/27/16 0400 10/27/16 0900 10/28/16 0435  NA 136  < > 137 136 135 137  --  136  K 3.2*  < > 4.0 4.0 3.1* 4.0  --  4.0  CL 114*  < > 112* 108 105 106  --  102  CO2 19*  < > 19* 19* 20* 21*  --  24  GLUCOSE 110*  < > 92 101* 101* 90  --  97  BUN 6  < > 5* <5* <5* <5*  --  7  CREATININE 0.51  < > 0.74 0.78 0.87 0.81  --  0.79  CALCIUM 7.8*  < > 8.2* 8.7* 8.5* 8.8*  --  9.0  MG 2.0  --  1.7 2.1 1.7 1.9  --  2.0  PHOS 1.1*  --  3.2  --  2.7  --  4.9* 4.3  < > = values in this interval not displayed. GFR: Estimated Creatinine Clearance: 95.1 mL/min (by C-G formula based on SCr of 0.79 mg/dL). Liver Function Tests:  Recent Labs Lab 10/21/16 2247 10/22/16 1400 10/23/16 0415 10/25/16 0330 10/27/16 0900  AST 732* 793* 333* 137* 69*  ALT 679* 512* 388* 253* 143*  ALKPHOS 91 45 42 55 45  BILITOT 0.1* 0.4 0.4 0.7 0.6  PROT 6.8 5.3* 5.3* 6.0* 6.3*  ALBUMIN 3.4* 2.8* 2.7* 2.8* 2.8*   No results for input(s): LIPASE, AMYLASE in the last 168  hours.  Recent Labs Lab 10/27/16 0900  AMMONIA 11   Coagulation Profile:  Recent Labs Lab 10/22/16 0200 10/22/16 1036 10/24/16 0300  INR 1.28 1.23 1.28   Cardiac Enzymes:  Recent Labs Lab 10/22/16 0200 10/22/16 0832 10/22/16 1445 10/22/16 1800  CKTOTAL  --  3,820*  --   --   CKMB  --  81.5*  --   --   TROPONINI 0.88*  1.19* 0.81* 0.68*   BNP (last 3 results) No results for input(s): PROBNP in the last 8760 hours. HbA1C: No results for input(s): HGBA1C in the last 72 hours. CBG:  Recent Labs Lab 10/27/16 2028 10/27/16 2357 10/28/16 0422 10/28/16 0747 10/28/16 1137  GLUCAP 99 77 94 89 88   Lipid Profile: No results for input(s): CHOL, HDL, LDLCALC, TRIG, CHOLHDL, LDLDIRECT in the last 72 hours. Thyroid Function Tests: No results for input(s): TSH, T4TOTAL, FREET4, T3FREE, THYROIDAB in the last 72 hours. Anemia Panel: No results for input(s): VITAMINB12, FOLATE, FERRITIN, TIBC, IRON, RETICCTPCT in the last 72 hours. Sepsis Labs:  Recent Labs Lab 10/22/16 0200 10/22/16 1035 10/22/16 1700 10/24/16 0300  LATICACIDVEN 4.5* 2.0* 1.6 0.6    Recent Results (from the past 240 hour(s))  MRSA PCR Screening     Status: None   Collection Time: 10/22/16  1:05 AM  Result Value Ref Range Status   MRSA by PCR NEGATIVE NEGATIVE Final    Comment:        The GeneXpert MRSA Assay (FDA approved for NASAL specimens only), is one component of a comprehensive MRSA colonization surveillance program. It is not intended to diagnose MRSA infection nor to guide or monitor treatment for MRSA infections.   Culture, respiratory (NON-Expectorated)     Status: None   Collection Time: 10/22/16  8:52 AM  Result Value Ref Range Status   Specimen Description TRACHEAL ASPIRATE  Final   Special Requests Normal  Final   Gram Stain   Final    DEGENERATED CELLULAR MATERIAL PRESENT RARE SQUAMOUS EPITHELIAL CELLS PRESENT FEW GRAM POSITIVE COCCI IN PAIRS RARE GRAM POSITIVE  RODS RARE GRAM NEGATIVE COCCI IN PAIRS    Culture Consistent with normal respiratory flora.  Final   Report Status 10/24/2016 FINAL  Final  Culture, blood (routine x 2)     Status: None   Collection Time: 10/22/16  9:08 AM  Result Value Ref Range Status   Specimen Description BLOOD A-LINE  Final   Special Requests BOTTLES DRAWN AEROBIC AND ANAEROBIC 5CC  Final   Culture NO GROWTH 5 DAYS  Final   Report Status 10/27/2016 FINAL  Final  Culture, blood (routine x 2)     Status: None   Collection Time: 10/22/16  9:18 AM  Result Value Ref Range Status   Specimen Description BLOOD LEFT ANTECUBITAL  Final   Special Requests IN PEDIATRIC BOTTLE 3.0CC  Final   Culture NO GROWTH 5 DAYS  Final   Report Status 10/27/2016 FINAL  Final       Radiology Studies: Mr Brain Wo Contrast  Result Date: 10/27/2016 CLINICAL DATA:  Seizure EXAM: MRI HEAD WITHOUT CONTRAST TECHNIQUE: Multiplanar, multiecho pulse sequences of the brain and surrounding structures were obtained without intravenous contrast. COMPARISON:  CT head 10/22/2016 FINDINGS: Brain: Ventricle size and cerebral volume normal. Negative for acute or chronic infarction. White matter normal. Brainstem and cerebellum normal. No intracranial hemorrhage or mass. No shift of the midline structures. No structural abnormality. Vascular: Normal arterial flow voids. Skull and upper cervical spine: Negative Sinuses/Orbits: Mucosal edema throughout the paranasal sinuses. Extensive mucosal edema and air-fluid level in the sphenoid sinus. Mild mastoid sinus effusion bilaterally. Other: None IMPRESSION: Normal unenhanced MRI of the brain Extensive sinus mucosal disease most notably in the sphenoid sinus. Air-fluid level in the sphenoid sinus. Electronically Signed   By: Franchot Gallo M.D.   On: 10/27/2016 11:23   Dg Chest Port 1 View  Result Date: 10/26/2016 CLINICAL  DATA:  Intubation. EXAM: PORTABLE CHEST 1 VIEW COMPARISON:  10/26/2016. FINDINGS: Endotracheal  tube, NG tube, right subclavian line in good anatomic position. Cardiomegaly with normal pulmonary vascularity. Low lung volumes with partial clearing of bibasilar atelectasis. No pleural effusion or pneumothorax . IMPRESSION: 1. Lines and tubes in stable position. 2.  Low lung volumes with partial clearing of bibasilar atelectasis. Electronically Signed   By: [place]   On: 10/26/2016 13:52      Scheduled Meds: . lidocaine      . chlorhexidine gluconate (MEDLINE KIT)  15 mL Mouth Rinse BID  . levETIRAcetam  1,000 mg Oral BID  . mouth rinse  15 mL Mouth Rinse Q4H  . metoprolol tartrate  25 mg Oral BID  . sodium chloride flush  10-40 mL Intracatheter Q12H   Continuous Infusions:   LOS: 6 days    Time spent: 40 minutes   Dessa Phi, DO [facility] [email or web address] Password [email or web address] 10/28/2016, 1:13 PM

## 2016-10-28 NOTE — Progress Notes (Addendum)
Pt resting in bed lower extremity tremors have subsided. Dr. Katherine Roan aware and came to see patient. Will cont to monitor pt.

## 2016-10-28 NOTE — Plan of Care (Signed)
Problem: Safety: Goal: Ability to remain free from injury will improve Outcome: Progressing Bed alarm on, call light within reach, non-skid socks on, hourly rounding performed, room close to nurses station. Explained the importance of using the call light before getting out of bed and  for any other needs or concerns. Pt demonstrated how to use call light and verbalized understanding.  Will continue to monitor.   Problem: Physical Regulation: Goal: Will remain free from infection Outcome: Progressing Pt remained afebrile this shift. Will continue to monitor and prevent infection.   Problem: Skin Integrity: Goal: Risk for impaired skin integrity will decrease Outcome: Progressing Pt's skin intact. No signs of skin breakdown on bony prominences. Will continue to assist the pt with repositioning in order to prevent skin break down.

## 2016-10-29 ENCOUNTER — Inpatient Hospital Stay (HOSPITAL_COMMUNITY): Payer: Medicare Other

## 2016-10-29 LAB — CBC WITH DIFFERENTIAL/PLATELET
Basophils Absolute: 0 10*3/uL (ref 0.0–0.1)
Basophils Relative: 0 %
Eosinophils Absolute: 0.1 10*3/uL (ref 0.0–0.7)
Eosinophils Relative: 1 %
HEMATOCRIT: 29.1 % — AB (ref 36.0–46.0)
HEMOGLOBIN: 9.9 g/dL — AB (ref 12.0–15.0)
LYMPHS ABS: 2.5 10*3/uL (ref 0.7–4.0)
LYMPHS PCT: 46 %
MCH: 29.6 pg (ref 26.0–34.0)
MCHC: 34 g/dL (ref 30.0–36.0)
MCV: 86.9 fL (ref 78.0–100.0)
Monocytes Absolute: 0.6 10*3/uL (ref 0.1–1.0)
Monocytes Relative: 11 %
NEUTROS ABS: 2.3 10*3/uL (ref 1.7–7.7)
NEUTROS PCT: 42 %
Platelets: 241 10*3/uL (ref 150–400)
RBC: 3.35 MIL/uL — AB (ref 3.87–5.11)
RDW: 13.3 % (ref 11.5–15.5)
WBC: 5.4 10*3/uL (ref 4.0–10.5)

## 2016-10-29 LAB — BASIC METABOLIC PANEL
Anion gap: 7 (ref 5–15)
BUN: 6 mg/dL (ref 6–20)
CHLORIDE: 101 mmol/L (ref 101–111)
CO2: 27 mmol/L (ref 22–32)
Calcium: 8.8 mg/dL — ABNORMAL LOW (ref 8.9–10.3)
Creatinine, Ser: 0.79 mg/dL (ref 0.44–1.00)
GFR calc Af Amer: >60 mL/min (ref >60)
GFR calc non Af Amer: >60 mL/min (ref >60)
GLUCOSE: 87 mg/dL (ref 65–99)
POTASSIUM: 3.5 mmol/L (ref 3.5–5.1)
SODIUM: 135 mmol/L (ref 135–145)

## 2016-10-29 MED ORDER — TRAMADOL HCL 50 MG PO TABS
50.0000 mg | ORAL_TABLET | Freq: Four times a day (QID) | ORAL | Status: DC | PRN
  Administered 2016-10-29: 50 mg via ORAL
  Filled 2016-10-29: qty 1

## 2016-10-29 MED ORDER — METOPROLOL TARTRATE 25 MG PO TABS
25.0000 mg | ORAL_TABLET | Freq: Three times a day (TID) | ORAL | Status: DC
  Administered 2016-10-29 – 2016-11-01 (×9): 25 mg via ORAL
  Filled 2016-10-29 (×9): qty 1

## 2016-10-29 MED ORDER — POTASSIUM CHLORIDE CRYS ER 20 MEQ PO TBCR
40.0000 meq | EXTENDED_RELEASE_TABLET | Freq: Once | ORAL | Status: AC
  Administered 2016-10-29: 40 meq via ORAL
  Filled 2016-10-29: qty 2

## 2016-10-29 NOTE — Progress Notes (Signed)
On room air while pt was resting in bed Pt's 02 sat was 92-93%. However, when pt started to talking with friends and family pt's 46 sat went down to 88-89%. Pt placed back on 2L of 02. 02 sat 96% on 2L.

## 2016-10-29 NOTE — Evaluation (Signed)
Occupational Therapy Evaluation Patient Details Name: Rachel Duncan MRN: [id number] DOB: 10/05/1981 Today's Date: 10/29/2016    History of Present Illness Rachel Duncan is a 35 year old female with history of alcohol abuse who was brought in to [facility] ED via EMS after cardiac arrest at home. Apparently, patient was looking at her phone after finishing dinner when she suddenly fell, arched back appearing stiff. Upon EMS arrival, patient was found to be in V. fib. She received total of 6 shocks and one round of epinephrine and achieved return of spontaneous circulation. Patient was intubated, taken to cath Lab where she was found to have no significant coronary artery disease. She also had seizures when she arrived in the ICU. Pt was intubated, extubated and reintubated on 12/5, self extubated on 12/7   Clinical Impression   Pt was independent prior to admission. Presents with impaired cognition, decreased standing balance, abdominal pain and decreased activity tolerance. Requires min assist for ADL and mobility with RW on 2L 02 to maintain sats in 90s. Will follow acutely.  Anticipate pt will progress to be able to d/c home.    Follow Up Recommendations  Home health OT    Equipment Recommendations  None recommended by OT    Recommendations for Other Services       Precautions / Restrictions Precautions Precautions: Fall Restrictions Weight Bearing Restrictions: No      Mobility Bed Mobility Overal bed mobility: Modified Independent             General bed mobility comments: pt able to remove covers and get to EOB without assist  Transfers Overall transfer level: Needs assistance Equipment used: Rolling walker (2 wheeled) Transfers: Sit to/from Stand Sit to Stand: Min assist         General transfer comment: needed vc's for hand placement with each transfer, unsteady with initial standing but no c/o dizziness or nausea    Balance Overall balance assessment:  Needs assistance Sitting-balance support: No upper extremity supported Sitting balance-Leahy Scale: Good     Standing balance support: Bilateral upper extremity supported Standing balance-Leahy Scale: Poor Standing balance comment: fatigued very quickly in standing, relying on UE support today                            ADL Overall ADL's : Needs assistance/impaired Eating/Feeding: Independent;Sitting   Grooming: Wash/dry hands;Wash/dry face;Sitting;Set up   Upper Body Bathing: Minimal assistance;Sitting   Lower Body Bathing: Minimal assistance;Sit to/from stand   Upper Body Dressing : Minimal assistance;Sitting   Lower Body Dressing: Minimal assistance;Sit to/from stand   Toilet Transfer: Minimal assistance;Ambulation;RW;BSC   Toileting- Clothing Manipulation and Hygiene: Minimal assistance;Sit to/from stand       Functional mobility during ADLs: Minimal assistance;Rolling walker;Cane;Cueing for sequencing General ADL Comments: Min assist for mesh underwear and pad as pt having her period and was not aware.     Vision Additional Comments: appeared intact   Perception     Praxis      Pertinent Vitals/Pain Pain Assessment: Faces Faces Pain Scale: Hurts even more Pain Location: abdomen Pain Descriptors / Indicators: Aching;Cramping Pain Intervention(s): Monitored during session;Patient requesting pain meds-RN notified     Hand Dominance Right   Extremity/Trunk Assessment Upper Extremity Assessment Upper Extremity Assessment: Generalized weakness   Lower Extremity Assessment Lower Extremity Assessment: Defer to PT evaluation RLE Deficits / Details: hip flex 3+/5, knee ext 3+/5, knee flex 4/5 RLE Coordination: decreased gross  motor LLE Deficits / Details: hip flex 4/5, knee ext 4/5, knee flex 4/5 LLE Coordination: decreased gross motor   Cervical / Trunk Assessment Cervical / Trunk Assessment: Normal   Communication Communication Communication:  No difficulties   Cognition Arousal/Alertness: Awake/alert Behavior During Therapy: Flat affect Overall Cognitive Status: Impaired/Different from baseline Area of Impairment: Following commands;Safety/judgement;Awareness;Problem solving;Memory     Memory: Decreased short-term memory Following Commands: Follows multi-step commands with increased time;Follows one step commands with increased time Safety/Judgement: Decreased awareness of safety;Decreased awareness of deficits Awareness: Emergent Problem Solving: Slow processing;Requires verbal cues;Difficulty sequencing General Comments: pt slow with all responses, decreased awareness of self in space and environment around her. When questioned if she feels back to herself cognitively, she thinks she is.    General Comments       Exercises Exercises: General Lower Extremity     Shoulder Instructions      Home Living Family/patient expects to be discharged to:: Private residence Living Arrangements: Parent;Children Available Help at Discharge: Family;Available 24 hours/day Type of Home: Mobile home Home Access: Stairs to enter Entrance Stairs-Number of Steps: 3 Entrance Stairs-Rails: Right Home Layout: One level     Bathroom Shower/Tub: Occupational psychologist: Standard     Home Equipment: None   Additional Comments: pt has 5 children, 3 live with her (5, 6, 37) 2yo and 33 yo live with other family members      Prior Functioning/Environment Level of Independence: Independent        Comments: does not drive        OT Problem List: Decreased strength;Decreased activity tolerance;Impaired balance (sitting and/or standing);Decreased cognition;Decreased safety awareness;Decreased knowledge of use of DME or AE;Cardiopulmonary status limiting activity;Pain   OT Treatment/Interventions: Self-care/ADL training;DME and/or AE instruction;Therapeutic activities;Patient/family education;Balance training;Cognitive  remediation/compensation    OT Goals(Current goals can be found in the care plan section) Acute Rehab OT Goals Patient Stated Goal: return home OT Goal Formulation: With patient Time For Goal Achievement: 11/12/16 Potential to Achieve Goals: Good ADL Goals Pt Will Perform Grooming: standing;with supervision Pt Will Perform Upper Body Bathing: sitting;with supervision Pt Will Perform Lower Body Bathing: with supervision;sit to/from stand Pt Will Perform Upper Body Dressing: with supervision;sitting Pt Will Perform Lower Body Dressing: with supervision;sit to/from stand Pt Will Transfer to Toilet: with supervision;ambulating;regular height toilet Pt Will Perform Toileting - Clothing Manipulation and hygiene: with supervision;sit to/from stand Pt Will Perform Tub/Shower Transfer: Shower transfer;ambulating;with supervision  OT Frequency: Min 3X/week   Barriers to D/C:            Co-evaluation PT/OT/SLP Co-Evaluation/Treatment: Yes Reason for Co-Treatment: For patient/therapist safety PT goals addressed during session: Mobility/safety with mobility;Balance;Proper use of DME;Strengthening/ROM OT goals addressed during session: ADL's and self-care      End of Session Equipment Utilized During Treatment: Gait belt;Rolling walker;Oxygen Nurse Communication: Mobility status  Activity Tolerance: Patient tolerated treatment well Patient left: in chair;with call bell/phone within reach;with chair alarm set   Time: [id number] OT Time Calculation (min): 27 min Charges:  OT General Charges $OT Visit: 1 Procedure OT Evaluation $OT Eval Moderate Complexity: 1 Procedure G-Codes:    Malka So 10/29/2016, 11:33 AM  [phone]

## 2016-10-29 NOTE — Progress Notes (Signed)
Subjective:   Patient denies any chest pain or shortness of breath. Denies any palpitations. Noted to have ventricular bigeminy nonsustained area and occasional triplet and trigeminy asymptomatic.  Objective:  Vital Signs in the last 24 hours: Temp:  [97.8 F (36.6 C)-99.6 F (37.6 C)] 99.6 F (37.6 C) (12/10 0500) Pulse Rate:  [70-94] 94 (12/10 1034) Resp:  [18-26] 26 (12/10 1034) BP: (123-130)/(69-93) 127/84 (12/10 1034) SpO2:  [93 %-100 %] 100 % (12/10 1034)  Intake/Output from previous day: 12/09 0701 - 12/10 0700 In: 390 [P.O.:390] Out: 300 [Urine:300] Intake/Output from this shift: No intake/output data recorded.  Physical Exam: Neck: no adenopathy, no carotid bruit, no JVD and supple, symmetrical, trachea midline Lungs: clear to auscultation bilaterally Heart: regular rate and rhythm, S1, S2 normal and Soft systolic murmur noted Abdomen: soft, non-tender; bowel sounds normal; no masses,  no organomegaly Extremities: extremities normal, atraumatic, no cyanosis or edema  Lab Results:  Recent Labs  10/28/16 0435 10/29/16 0244  WBC 3.9* 5.4  HGB 9.6* 9.9*  PLT 217 241    Recent Labs  10/28/16 0435 10/29/16 0244  NA 136 135  K 4.0 3.5  CL 102 101  CO2 24 27  GLUCOSE 97 87  BUN 7 6  CREATININE 0.79 0.79   No results for input(s): TROPONINI in the last 72 hours.  Invalid input(s): CK, MB Hepatic Function Panel  Recent Labs  10/27/16 0900  PROT 6.3*  ALBUMIN 2.8*  AST 69*  ALT 143*  ALKPHOS 45  BILITOT 0.6  BILIDIR 0.2  IBILI 0.4   No results for input(s): CHOL in the last 72 hours. No results for input(s): PROTIME in the last 72 hours.  Imaging: Imaging results have been reviewed and Mr Lumbar Spine Wo Contrast  Result Date: 10/29/2016 CLINICAL DATA:  Initial evaluation for new onset seizure and setting of cardiac arrest. Bilateral leg heaviness. Recent lumbar puncture. EXAM: MRI LUMBAR SPINE WITHOUT CONTRAST TECHNIQUE: Multiplanar,  multisequence MR imaging of the lumbar spine was performed. No intravenous contrast was administered. COMPARISON:  Prior radiographs from 10/28/2016 related a lumbar puncture. FINDINGS: Segmentation: Transitional lumbosacral anatomy with sacralization of the L5 vertebral body. L5-S1 disc is somewhat rudimentary. Alignment: Vertebral bodies normally aligned with preservation of the normal lumbar lordosis. No listhesis. Vertebrae: Vertebral body heights are well maintained. No evidence for acute or chronic fracture. Signal intensity within the vertebral body bone marrow within normal limits. No focal osseous lesions. No abnormal marrow edema. Conus medullaris: Extends to the L1 level and appears normal. No epidural collection or other abnormality identified status post recent lumbar puncture. Paraspinal and other soft tissues: Mild fluid signal intensity within the left paraspinous musculature at the L2-3 levels, suspected to be related to recent lumbar puncture (series 6, image 13). Mild subcutaneous edema. Paraspinous soft tissues are otherwise within normal limits. Moderate distension noted of the partially visualized urinary bladder. Remainder of the visualized visceral structures are within normal limits. Disc levels: No significant degenerative changes are identified within the lumbar spine. No disc bulge or disc protrusion. Intervertebral discs are well hydrated. No significant facet arthropathy. No canal or foraminal stenosis. IMPRESSION: 1. Essentially normal MRI of the lumbar spine. No acute abnormality identified to explain patient's symptoms. 2. Mild induration within the left posterior paraspinous musculature, likely related to recent lumbar puncture. No complication identified status post recent intervention. 3. Moderate distention of the partially visualized urinary bladder. 4. Transitional lumbosacral anatomy with sacralization of the L5 vertebral body. Electronically Signed  By: Jeannine Boga  M.D.   On: 10/29/2016 01:45   Dg Abd Portable 1v  Result Date: 10/29/2016 CLINICAL DATA:  Abdominal pain at umbilical region for 2 days EXAM: PORTABLE ABDOMEN - 1 VIEW COMPARISON:  None FINDINGS: Scattered gas and stool throughout colon to rectum. Small bowel gas pattern normal. No evidence of bowel dilatation or bowel wall thickening. Osseous structures unremarkable. Scattered pelvic phleboliths without definite urinary tract calcification. IMPRESSION: Nonspecific bowel gas pattern. Electronically Signed   By: Lavonia Dana M.D.   On: 10/29/2016 13:04   Dg Fluoro Guide Lumbar Puncture  Result Date: 10/28/2016 CLINICAL DATA:  Patient with history of seizure. EXAM: DIAGNOSTIC LUMBAR PUNCTURE UNDER FLUOROSCOPIC GUIDANCE FLUOROSCOPY TIME:  Fluoroscopy Time:  36 seconds PROCEDURE: Informed consent was obtained from the patient prior to the procedure, including potential complications of headache, allergy, and pain. With the patient prone, the lower back was prepped with Betadine. 1% Lidocaine was used for local anesthesia. Lumbar puncture was performed at the L3-4 level using a 20 gauge needle with return of clear CSF. No opening pressure was obtained due to patient's inability to move. 8 ml of CSF were obtained for laboratory studies. The patient tolerated the procedure well and there were no apparent complications. IMPRESSION: Technically successful lumbar puncture. Fluid sent to laboratory for analysis. Electronically Signed   By: Lovey Newcomer M.D.   On: 10/28/2016 14:40    Cardiac Studies:  Assessment/Plan:  Status post out of hospital V. fib cardiac arrest Status post nonsustained ventricular bigeminy Seizure disorder Status post hypoxic respiratory failure EtOH abuse Tobacco abuse Resolving anoxic encephalopathy Hypokalemia Plan Replace K Increase beta blockers as per orders Check labs in a.m.  LOS: 7 days    Charolette Forward 10/29/2016, 1:30 PM

## 2016-10-29 NOTE — Evaluation (Signed)
Physical Therapy Evaluation Patient Details Name: Rachel Duncan MRN: [id number] DOB: [birth year] Today's Date: 10/29/2016   History of Present Illness  (P) Rachel Duncan is a 35 year old female with history of alcohol abuse who was brought in to [facility] ED via EMS after cardiac arrest at home. Apparently, patient was looking at her phone after finishing dinner when she suddenly fell, arched back appearing stiff. Upon EMS arrival, patient was found to be in V. fib. She received total of 6 shocks and one round of epinephrine and achieved return of spontaneous circulation. Patient was intubated, taken to cath Lab where she was found to have no significant coronary artery disease. She also had seizures when she arrived in the ICU. Pt was intubated, extubated and reintubated on 12/5, self extubated on 12/7  Clinical Impression  Pt admitted with above diagnosis. Pt currently with functional limitations due to the deficits listed below (see PT Problem List). Pt slow with response time as well as lacking spatial awareness with mobility. Ambulated 150' with RW and min A with +2 for equipment. O2 sats dropped to upper 70's on RA, maintained in 90's on 2L.   Pt will benefit from skilled PT to increase their independence and safety with mobility to allow discharge to the venue listed below.       Follow Up Recommendations Home health PT;Supervision for mobility/OOB    Equipment Recommendations  Other (comment) (TBD)    Recommendations for Other Services       Precautions / Restrictions Precautions Precautions: (P) Fall Restrictions Weight Bearing Restrictions: (P) No      Mobility  Bed Mobility Overal bed mobility: Modified Independent             General bed mobility comments: pt able to remove covers and get to EOB without assist  Transfers Overall transfer level: Needs assistance Equipment used: Rolling walker (2 wheeled) Transfers: Sit to/from Stand Sit to Stand: Min assist          General transfer comment: needed vc's for hand placement with each transfer, unsteady with initial standing but no c/o dizziness or nausea  Ambulation/Gait Ambulation/Gait assistance: Min assist;+2 safety/equipment Ambulation Distance (Feet): 150 Feet Assistive device: Rolling walker (2 wheeled) Gait Pattern/deviations: Step-through pattern;Narrow base of support;Drifts right/left Gait velocity: decreased Gait velocity interpretation: Below normal speed for age/gender General Gait Details: very narrow BOS, ran into a few objects with RW, decreased balance with turning and backing.   Stairs            Wheelchair Mobility    Modified Rankin (Stroke Patients Only)       Balance Overall balance assessment: Needs assistance Sitting-balance support: No upper extremity supported Sitting balance-Leahy Scale: Good     Standing balance support: Bilateral upper extremity supported Standing balance-Leahy Scale: Poor Standing balance comment: fatigued very quickly in standing, relying on UE support today                             Pertinent Vitals/Pain Pain Assessment: (P) Faces Faces Pain Scale: Hurts even more Pain Location: abdomen Pain Descriptors / Indicators: Aching;Cramping Pain Intervention(s): Monitored during session (RN notified)    Home Living Family/patient expects to be discharged to:: (P) Private residence Living Arrangements: (P) Parent;Children Available Help at Discharge: (P) Family;Available 24 hours/day Type of Home: (P) Mobile home Home Access: (P) Stairs to enter Entrance Stairs-Rails: (P) Right Entrance Stairs-Number of Steps: (P) 3 Home Layout: (P)  One level Home Equipment: (P) None Additional Comments: (P) pt has 5 children, 3 live with her (5, 6, 64) 2yo and 33 yo live with other family members    Prior Function Level of Independence: (P) Independent         Comments: (P) does not drive     Hand Dominance   Dominant  Hand: (P) Right    Extremity/Trunk Assessment   Upper Extremity Assessment: Defer to OT evaluation           Lower Extremity Assessment: RLE deficits/detail;LLE deficits/detail RLE Deficits / Details: hip flex 3+/5, knee ext 3+/5, knee flex 4/5 LLE Deficits / Details: hip flex 4/5, knee ext 4/5, knee flex 4/5  Cervical / Trunk Assessment: Normal  Communication   Communication: (P) No difficulties  Cognition Arousal/Alertness: Awake/alert Behavior During Therapy: Flat affect Overall Cognitive Status: Impaired/Different from baseline Area of Impairment: Following commands;Safety/judgement;Awareness;Problem solving;Memory     Memory: Decreased short-term memory Following Commands: Follows multi-step commands with increased time;Follows one step commands with increased time Safety/Judgement: Decreased awareness of safety;Decreased awareness of deficits Awareness: Emergent Problem Solving: Slow processing;Requires verbal cues;Difficulty sequencing General Comments: pt slow with all responses, decreased awareness of self in space and environment around her. When questioned if she feels back to herself cognitively, she thinks she is.     General Comments General comments (skin integrity, edema, etc.): O2 sats dropped to upper 70's on RA, maintained in 90's on 2L O2    Exercises General Exercises - Lower Extremity Ankle Circles/Pumps: AROM;Both;20 reps;Seated Long Arc Quad: AROM;Both;20 reps;Seated   Assessment/Plan    PT Assessment Patient needs continued PT services  PT Problem List Decreased strength;Decreased activity tolerance;Decreased balance;Decreased mobility;Decreased knowledge of use of DME;Decreased knowledge of precautions;Pain;Decreased cognition;Decreased coordination          PT Treatment Interventions DME instruction;Gait training;Stair training;Functional mobility training;Therapeutic activities;Therapeutic exercise;Balance training;Cognitive  remediation;Patient/family education    PT Goals (Current goals can be found in the Care Plan section)  Acute Rehab PT Goals Patient Stated Goal: return home PT Goal Formulation: With patient Time For Goal Achievement: 11/12/16 Potential to Achieve Goals: Good    Frequency Min 3X/week   Barriers to discharge        Co-evaluation PT/OT/SLP Co-Evaluation/Treatment: Yes Reason for Co-Treatment: (P) For patient/therapist safety PT goals addressed during session: Mobility/safety with mobility;Balance;Proper use of DME;Strengthening/ROM OT goals addressed during session: (P) ADL's and self-care       End of Session Equipment Utilized During Treatment: Gait belt Activity Tolerance: Patient tolerated treatment well Patient left: in chair;with chair alarm set;with call bell/phone within reach Nurse Communication: Mobility status         Time: 0907-1000 PT Time Calculation (min) (ACUTE ONLY): 53 min   Charges:   PT Evaluation $PT Eval Moderate Complexity: 1 Procedure PT Treatments $Gait Training: 8-22 mins   PT G Codes:       Leighton Roach, PT  Acute Rehab Services  [place] 10/29/2016, 11:27 AM

## 2016-10-29 NOTE — Progress Notes (Addendum)
Subjective: No complaints with LE weakness or sensations. No having Lower abdominal pain.   Exam: Vitals:   10/28/16 2301 10/29/16 0500  BP: (!) 130/93 124/69  Pulse: 87 81  Resp:  19  Temp:  99.6 F (37.6 C)        Gen: In bed, NAD MS: alert and oriented CN: 2-12 intact Motor: MAEW Sensory:intact throughout DTR:2+  Pertinent Labs/Diagnostics: Results for AZAHRIA, LEHMANN (MRN [id number]) as of 10/29/2016 09:48  Ref. Range 10/28/2016 14:00  Glucose, CSF Latest Ref Range: 40 - 70 mg/dL 60  Total  Protein, CSF Latest Ref Range: 15 - 45 mg/dL 15  RBC Count, CSF Latest Ref Range: 0 /cu mm 344 (H)  WBC, CSF Latest Ref Range: 0 - 5 /cu mm 2  Segmented Neutrophils-CSF Latest Ref Range: 0 - 6 % RARE  Lymphs, CSF Latest Ref Range: 40 - 80 % FEW  Monocyte-Macrophage-Spinal Fluid Latest Ref Range: 15 - 45 % RARE  Other Cells, CSF Unknown TOO FEW TO COUNT,...  Appearance, CSF Latest Ref Range: CLEAR  CLEAR  Color, CSF Latest Ref Range: COLORLESS  COLORLESS  Supernatant Unknown NOT INDICATED  Tube # Unknown 1      Impression: 35 year old female with new onset seizure in the setting of cardiac arrest. It is unclear to me which came first. In this setting, I do not typically recommend long-term antiepileptics without any seizure history or any clear predisposition. It is not clear to me that this was related to alcohol in any way. No further seizures. Off Keppra. LP unrevealing. LE weakness resolved.   Per [facility] statutes, patients with seizures are not allowed to drive until  they have been seizure-free for six months. Use caution when using heavy equipment or power tools. Avoid working on ladders or at heights. Take showers instead of baths. Ensure the water temperature is not too high on the home water heater. Do not go swimming alone. When caring for infants or small children, sit down when holding, feeding, or changing them to minimize risk of injury to the child in the  event you have a seizure.    At this time neurology will S/O   Etta Quill PA-C [facility] [id number]  M-F  (8:30 am- 4 PM)  10/29/2016, 9:50 AM

## 2016-10-29 NOTE — Progress Notes (Addendum)
PROGRESS NOTE    Rachel Duncan  GBT:[id number] DOB: [birth year] DOA: 10/21/2016 PCP: No primary care provider on file.     Brief Narrative:  Rachel Duncan is a 35 year old female with history of alcohol abuse who was brought in to [facility] ED via EMS after cardiac arrest at home. Apparently, patient was looking at her phone after finishing dinner when she suddenly fell, arched back appearing stiff. Upon EMS arrival, patient was found to be in V. fib. She received total of 6 shocks and one round of epinephrine and achieved return of spontaneous circulation. Patient was intubated, taken to cath Lab where she was found to have no significant coronary artery disease. She also had seizures when she arrived in the ICU. Etiology of her arrest could be due to seizure leading to arrhythmia or arrhythmia leading to hypoperfusion and seizure. Neurology, cardiology have been following patient.    Assessment & Plan:   Principal Problem:   Cardiac arrest [facility]) Active Problems:   Acute respiratory failure with hypoxia (HCC)   Acute encephalopathy   Anoxic encephalopathy (HCC)   Seizure (HCC)  V. fib arrest -Heart cath on 12/2 with normal coronary arteries and preserved EF -Received cooling protocol while in ICU -Appreciate cardiology, patient needs TTE limited prior to discharge to re-evaluate LV systolic function -Continue metoprolol BID   Seizure -Appreciate neurology -EEG 12/4: EEG is markedly abnormal due to diffuse background suppression and lack of EEG reactivity.  There was no epileptiform activity on this recording, but this does not rule out seizure disorder. -MRI brain: unremarkable -LP 12/9: WBC 2, protein 15, glucose 60. Culture pending.  -No further work up. No long-term antiepileptics without seizure hx or clear predisposition  -No driving for 6 months.   Lower abdominal pain -AXR today. If persists, may consider pelvic US   Bilateral leg heaviness/tremor -Post-LP, now resolved    -MRI lumbar unremarkable  Acute encephalopathy  -Likely secondary to prolonged arrest. Improving. Work up as above.   Acute hypoxemic respiratory failure -Secondary to inability to protect airway, aspiration  -Intubated on admission, extubated 12/5, re-intubated 12/5, and self extubated 12/7 -Continue [place] O2, wean as able   Pyuria -Leukocytes and nitrite in Ua, but only 6-30 WBC. Urine culture not available.  -Received 7 day course rocephin  Elevated liver enzymes -Likely due to arrest, shock liver -Trend LFT, improving with supportive care   History of alcohol abuse -Last alcoholic drink was Thanksgiving   DVT prophylaxis: SCDs Code Status: Full Family Communication: no family at bedside Disposition Plan: pending further improvement. PT OT to eval    Consultants:   PCCM  Cardiology  Neurology   Procedures:  -Heart cath on 12/2 with normal coronary arteries and preserved EF -EEG 12/4: EEG is markedly abnormal due to diffuse background suppression and lack of EEG reactivity.  There was no epileptiform activity on this recording, but this does not rule out seizure disorder. -LP 12/8: difficult tap -LP 12/9: WBC 2, protein 15, glucose 60.  Antimicrobials:  Anti-infectives    Start     Dose/Rate Route Frequency Ordered Stop   10/22/16 0500  cefTRIAXone (ROCEPHIN) 2 g in dextrose 5 % 50 mL IVPB     2 g 100 mL/hr over 30 Minutes Intravenous Daily 10/22/16 0436 10/28/16 0623       Subjective: Patient feeling well today. No complaints of chest pain, shortness of breath. Has some lower abdominal discomfort but had a normal BM and no dysuria today. She  is on her menstrual cycle. No longer having any LE heaviness or tremors.    Objective: Vitals:   10/28/16 2028 10/28/16 2256 10/28/16 2301 10/29/16 0500  BP: 123/82  (!) 130/93 124/69  Pulse: 92 83 87 81  Resp: 18   19  Temp: 98.5 F (36.9 C)   99.6 F (37.6 C)  TempSrc: Oral   Oral  SpO2: 96%   98%  Weight:       Height:        Intake/Output Summary (Last 24 hours) at 10/29/16 0722 Last data filed at 10/29/16 0400  Gross per 24 hour  Intake              390 ml  Output              300 ml  Net               90 ml   Filed Weights   10/26/16 0411 10/27/16 0300 10/28/16 0426  Weight: 83.1 kg (183 lb 3.2 oz) 79.1 kg (174 lb 6.1 oz) 75.4 kg (166 lb 3.2 oz)    Examination:  General exam: Appears calm and comfortable  Respiratory system: Clear to auscultation. Respiratory effort normal. Cardiovascular system: S1 & S2 heard, RRR. No JVD, murmurs, rubs, gallops or clicks. No pedal edema. Gastrointestinal system: Abdomen is nondistended, soft and nontender. No organomegaly or masses felt. Normal bowel sounds heard. Central nervous system: Alert and oriented. No focal neurological deficits. Extremities: Symmetric 5 x 5 power. Skin: No rashes, lesions or ulcers Psychiatry: Judgement and insight appear normal. Mood & affect appropriate.   Data Reviewed: I have personally reviewed following labs and imaging studies  CBC:  Recent Labs Lab 10/24/16 0300 10/26/16 0300 10/27/16 0900 10/28/16 0435 10/29/16 0244  WBC 5.6 5.7 4.7 3.9* 5.4  NEUTROABS  --   --   --   --  2.3  HGB 9.8* 7.9* 8.6* 9.6* 9.9*  HCT 29.0* 23.6* 26.0* 29.0* 29.1*  MCV 87.9 86.8 88.1 87.6 86.9  PLT 156 171 192 217 258   Basic Metabolic Panel:  Recent Labs Lab 10/23/16 0415  10/24/16 0300 10/25/16 0330 10/26/16 0300 10/27/16 0400 10/27/16 0900 10/28/16 0435 10/29/16 0244  NA 136  < > 137 136 135 137  --  136 135  K 3.2*  < > 4.0 4.0 3.1* 4.0  --  4.0 3.5  CL 114*  < > 112* 108 105 106  --  102 101  CO2 19*  < > 19* 19* 20* 21*  --  24 27  GLUCOSE 110*  < > 92 101* 101* 90  --  97 87  BUN 6  < > 5* <5* <5* <5*  --  7 6  CREATININE 0.51  < > 0.74 0.78 0.87 0.81  --  0.79 0.79  CALCIUM 7.8*  < > 8.2* 8.7* 8.5* 8.8*  --  9.0 8.8*  MG 2.0  --  1.7 2.1 1.7 1.9  --  2.0  --   PHOS 1.1*  --  3.2  --  2.7  --  4.9*  4.3  --   < > = values in this interval not displayed. GFR: Estimated Creatinine Clearance: 95.1 mL/min (by C-G formula based on SCr of 0.79 mg/dL). Liver Function Tests:  Recent Labs Lab 10/22/16 1400 10/23/16 0415 10/25/16 0330 10/27/16 0900  AST 793* 333* 137* 69*  ALT 512* 388* 253* 143*  ALKPHOS 45 42 55 45  BILITOT 0.4 0.4  0.7 0.6  PROT 5.3* 5.3* 6.0* 6.3*  ALBUMIN 2.8* 2.7* 2.8* 2.8*   No results for input(s): LIPASE, AMYLASE in the last 168 hours.  Recent Labs Lab 10/27/16 0900  AMMONIA 11   Coagulation Profile:  Recent Labs Lab 10/22/16 1036 10/24/16 0300  INR 1.23 1.28   Cardiac Enzymes:  Recent Labs Lab 10/22/16 0832 10/22/16 1445 10/22/16 1800  CKTOTAL 3,820*  --   --   CKMB 81.5*  --   --   TROPONINI 1.19* 0.81* 0.68*   BNP (last 3 results) No results for input(s): PROBNP in the last 8760 hours. HbA1C: No results for input(s): HGBA1C in the last 72 hours. CBG:  Recent Labs Lab 10/27/16 2357 10/28/16 0422 10/28/16 0747 10/28/16 1137 10/28/16 1637  GLUCAP 77 94 89 88 89   Lipid Profile: No results for input(s): CHOL, HDL, LDLCALC, TRIG, CHOLHDL, LDLDIRECT in the last 72 hours. Thyroid Function Tests: No results for input(s): TSH, T4TOTAL, FREET4, T3FREE, THYROIDAB in the last 72 hours. Anemia Panel: No results for input(s): VITAMINB12, FOLATE, FERRITIN, TIBC, IRON, RETICCTPCT in the last 72 hours. Sepsis Labs:  Recent Labs Lab 10/22/16 1035 10/22/16 1700 10/24/16 0300  LATICACIDVEN 2.0* 1.6 0.6    Recent Results (from the past 240 hour(s))  MRSA PCR Screening     Status: None   Collection Time: 10/22/16  1:05 AM  Result Value Ref Range Status   MRSA by PCR NEGATIVE NEGATIVE Final    Comment:        The GeneXpert MRSA Assay (FDA approved for NASAL specimens only), is one component of a comprehensive MRSA colonization surveillance program. It is not intended to diagnose MRSA infection nor to guide or monitor treatment  for MRSA infections.   Culture, respiratory (NON-Expectorated)     Status: None   Collection Time: 10/22/16  8:52 AM  Result Value Ref Range Status   Specimen Description TRACHEAL ASPIRATE  Final   Special Requests Normal  Final   Gram Stain   Final    DEGENERATED CELLULAR MATERIAL PRESENT RARE SQUAMOUS EPITHELIAL CELLS PRESENT FEW GRAM POSITIVE COCCI IN PAIRS RARE GRAM POSITIVE RODS RARE GRAM NEGATIVE COCCI IN PAIRS    Culture Consistent with normal respiratory flora.  Final   Report Status 10/24/2016 FINAL  Final  Culture, blood (routine x 2)     Status: None   Collection Time: 10/22/16  9:08 AM  Result Value Ref Range Status   Specimen Description BLOOD A-LINE  Final   Special Requests BOTTLES DRAWN AEROBIC AND ANAEROBIC 5CC  Final   Culture NO GROWTH 5 DAYS  Final   Report Status 10/27/2016 FINAL  Final  Culture, blood (routine x 2)     Status: None   Collection Time: 10/22/16  9:18 AM  Result Value Ref Range Status   Specimen Description BLOOD LEFT ANTECUBITAL  Final   Special Requests IN PEDIATRIC BOTTLE 3.0CC  Final   Culture NO GROWTH 5 DAYS  Final   Report Status 10/27/2016 FINAL  Final  CSF culture     Status: None (Preliminary result)   Collection Time: 10/28/16  2:00 PM  Result Value Ref Range Status   Specimen Description CSF  Final   Special Requests NONE  Final   Gram Stain   Final    WBC PRESENT, PREDOMINANTLY MONONUCLEAR NO ORGANISMS SEEN CYTOSPIN SMEAR    Culture PENDING  Incomplete   Report Status PENDING  Incomplete       Radiology Studies: Mr Brain  Wo Contrast  Result Date: 10/27/2016 CLINICAL DATA:  Seizure EXAM: MRI HEAD WITHOUT CONTRAST TECHNIQUE: Multiplanar, multiecho pulse sequences of the brain and surrounding structures were obtained without intravenous contrast. COMPARISON:  CT head 10/22/2016 FINDINGS: Brain: Ventricle size and cerebral volume normal. Negative for acute or chronic infarction. White matter normal. Brainstem and  cerebellum normal. No intracranial hemorrhage or mass. No shift of the midline structures. No structural abnormality. Vascular: Normal arterial flow voids. Skull and upper cervical spine: Negative Sinuses/Orbits: Mucosal edema throughout the paranasal sinuses. Extensive mucosal edema and air-fluid level in the sphenoid sinus. Mild mastoid sinus effusion bilaterally. Other: None IMPRESSION: Normal unenhanced MRI of the brain Extensive sinus mucosal disease most notably in the sphenoid sinus. Air-fluid level in the sphenoid sinus. Electronically Signed   By: Franchot Gallo M.D.   On: 10/27/2016 11:23   Mr Lumbar Spine Wo Contrast  Result Date: 10/29/2016 CLINICAL DATA:  Initial evaluation for new onset seizure and setting of cardiac arrest. Bilateral leg heaviness. Recent lumbar puncture. EXAM: MRI LUMBAR SPINE WITHOUT CONTRAST TECHNIQUE: Multiplanar, multisequence MR imaging of the lumbar spine was performed. No intravenous contrast was administered. COMPARISON:  Prior radiographs from 10/28/2016 related a lumbar puncture. FINDINGS: Segmentation: Transitional lumbosacral anatomy with sacralization of the L5 vertebral body. L5-S1 disc is somewhat rudimentary. Alignment: Vertebral bodies normally aligned with preservation of the normal lumbar lordosis. No listhesis. Vertebrae: Vertebral body heights are well maintained. No evidence for acute or chronic fracture. Signal intensity within the vertebral body bone marrow within normal limits. No focal osseous lesions. No abnormal marrow edema. Conus medullaris: Extends to the L1 level and appears normal. No epidural collection or other abnormality identified status post recent lumbar puncture. Paraspinal and other soft tissues: Mild fluid signal intensity within the left paraspinous musculature at the L2-3 levels, suspected to be related to recent lumbar puncture (series 6, image 13). Mild subcutaneous edema. Paraspinous soft tissues are otherwise within normal limits.  Moderate distension noted of the partially visualized urinary bladder. Remainder of the visualized visceral structures are within normal limits. Disc levels: No significant degenerative changes are identified within the lumbar spine. No disc bulge or disc protrusion. Intervertebral discs are well hydrated. No significant facet arthropathy. No canal or foraminal stenosis. IMPRESSION: 1. Essentially normal MRI of the lumbar spine. No acute abnormality identified to explain patient's symptoms. 2. Mild induration within the left posterior paraspinous musculature, likely related to recent lumbar puncture. No complication identified status post recent intervention. 3. Moderate distention of the partially visualized urinary bladder. 4. Transitional lumbosacral anatomy with sacralization of the L5 vertebral body. Electronically Signed   By: Jeannine Boga M.D.   On: 10/29/2016 01:45   Dg Fluoro Guide Lumbar Puncture  Result Date: 10/28/2016 CLINICAL DATA:  Patient with history of seizure. EXAM: DIAGNOSTIC LUMBAR PUNCTURE UNDER FLUOROSCOPIC GUIDANCE FLUOROSCOPY TIME:  Fluoroscopy Time:  36 seconds PROCEDURE: Informed consent was obtained from the patient prior to the procedure, including potential complications of headache, allergy, and pain. With the patient prone, the lower back was prepped with Betadine. 1% Lidocaine was used for local anesthesia. Lumbar puncture was performed at the L3-4 level using a 20 gauge needle with return of clear CSF. No opening pressure was obtained due to patient's inability to move. 8 ml of CSF were obtained for laboratory studies. The patient tolerated the procedure well and there were no apparent complications. IMPRESSION: Technically successful lumbar puncture. Fluid sent to laboratory for analysis. Electronically Signed   By: Lovey Newcomer  M.D.   On: 10/28/2016 14:40      Scheduled Meds: . chlorhexidine gluconate (MEDLINE KIT)  15 mL Mouth Rinse BID  . Influenza vac split  quadrivalent PF  0.5 mL Intramuscular Tomorrow-1000  . mouth rinse  15 mL Mouth Rinse BID  . metoprolol tartrate  25 mg Oral BID  . sodium chloride flush  10-40 mL Intracatheter Q12H   Continuous Infusions:   LOS: 7 days    Time spent: 30 minutes   Dessa Phi, DO [facility] [email or web address] Password [email or web address] 10/29/2016, 7:22 AM

## 2016-10-30 ENCOUNTER — Inpatient Hospital Stay (HOSPITAL_COMMUNITY): Payer: Medicare Other

## 2016-10-30 ENCOUNTER — Encounter (HOSPITAL_COMMUNITY): Payer: Self-pay

## 2016-10-30 LAB — CBC WITH DIFFERENTIAL/PLATELET
Basophils Absolute: 0 10*3/uL (ref 0.0–0.1)
Basophils Relative: 0 %
EOS ABS: 0.1 10*3/uL (ref 0.0–0.7)
Eosinophils Relative: 1 %
HEMATOCRIT: 31.7 % — AB (ref 36.0–46.0)
HEMOGLOBIN: 10.5 g/dL — AB (ref 12.0–15.0)
LYMPHS ABS: 2.8 10*3/uL (ref 0.7–4.0)
Lymphocytes Relative: 44 %
MCH: 29.4 pg (ref 26.0–34.0)
MCHC: 33.1 g/dL (ref 30.0–36.0)
MCV: 88.8 fL (ref 78.0–100.0)
MONO ABS: 0.6 10*3/uL (ref 0.1–1.0)
Monocytes Relative: 9 %
NEUTROS ABS: 3 10*3/uL (ref 1.7–7.7)
NEUTROS PCT: 46 %
Platelets: 285 10*3/uL (ref 150–400)
RBC: 3.57 MIL/uL — ABNORMAL LOW (ref 3.87–5.11)
RDW: 13.7 % (ref 11.5–15.5)
WBC: 6.5 10*3/uL (ref 4.0–10.5)

## 2016-10-30 LAB — BASIC METABOLIC PANEL
Anion gap: 11 (ref 5–15)
BUN: 6 mg/dL (ref 6–20)
CHLORIDE: 103 mmol/L (ref 101–111)
CO2: 23 mmol/L (ref 22–32)
CREATININE: 0.83 mg/dL (ref 0.44–1.00)
Calcium: 9 mg/dL (ref 8.9–10.3)
GFR calc Af Amer: >60 mL/min (ref >60)
GFR calc non Af Amer: >60 mL/min (ref >60)
Glucose, Bld: 86 mg/dL (ref 65–99)
Potassium: 3.7 mmol/L (ref 3.5–5.1)
Sodium: 137 mmol/L (ref 135–145)

## 2016-10-30 LAB — MAGNESIUM: MAGNESIUM: 1.9 mg/dL (ref 1.7–2.4)

## 2016-10-30 LAB — HEPATIC FUNCTION PANEL
ALK PHOS: 52 U/L (ref 38–126)
ALT: 67 U/L — ABNORMAL HIGH (ref 14–54)
AST: 28 U/L (ref 15–41)
Albumin: 3 g/dL — ABNORMAL LOW (ref 3.5–5.0)
BILIRUBIN INDIRECT: 0.5 mg/dL (ref 0.3–0.9)
Bilirubin, Direct: 0.1 mg/dL (ref 0.1–0.5)
Total Bilirubin: 0.6 mg/dL (ref 0.3–1.2)
Total Protein: 6.3 g/dL — ABNORMAL LOW (ref 6.5–8.1)

## 2016-10-30 MED ORDER — IOPAMIDOL (ISOVUE-300) INJECTION 61%
INTRAVENOUS | Status: AC
  Filled 2016-10-30: qty 30

## 2016-10-30 NOTE — Care Management Note (Addendum)
Case Management Note  Patient Details  Name: Rachel Duncan MRN: [id number] Date of Birth: February 11, 1981  Subjective/Objective: Pt presented due to [facility] cardiac arrest. Intubated/ Extubated 10-26-16. Pt previously from home with her mother. Mother has guardianship of pt's children. PT/OT recommendations for [facility]. Pt has Medicaid- Pt will need a qualifying diagnosis for therapy and pt does not have. No PCP listed in the system. CM will reach out to mother in regards to PCP.                   Action/Plan: CM did try to contact patient's mother with the permission of patient. Voicemail was left and awaiting call back in regards to disposition plan. PT did ambulate patient and she is in need of 02. CM will have RN to reassess before d/c. CM did place call to [facility] to see if pt would qualify for 02 based upon respiratory failure diagnosis. CM will continue to monitor for additional needs.   Expected Discharge Date:                  Expected Discharge Plan:  [place]  In-House Referral:  NA  Discharge planning Services  CM Consult  Post Acute Care Choice:   Home with [place] Choice offered to:   Patient, Parent  DME Arranged:   RW, 8 DME Agency:   [facility]  HH Arranged:   RN,PT, OT [place] Agency:   [facility]  Status of Service:  Completed.  If discussed at [place] of Stay Meetings, dates discussed:    Additional Comments: 1056 11-01-16 Jacqlyn Krauss, RN,BSN [phone] CM did speak with pt in regards to 02. Staff RN ambulated patient and she will qualify for 02. CM did fax information to [facility] for 02 and RW. Patient's mother to come around noon for Life vest teaching. O2 and RW to be delivered before d/c. Pt has f/u appointment established. No further needs from CM at this time.    1358 10-31-16 Jacqlyn Krauss, RN, BSN [phone] CM did speak with pt and mother in regards to disposition needs. No  PCP available-CM did call several offices in the [place], [place] and no office accepting new patients at this time. CM did call the [facility] unable to schedule patient. CM then call Opal Sidles with [facility] and an appointment was scheduled with [facility]. Appointment placed on the AVS. CM did speak with patient and mother in regards to [place]. Agency List provided and mom chose [facility] for [employer]. Pt will need orders written for RN, PT, OT and DME shower chair, RW and 02. CM did make a referral with [facility] in regards to [place]. SOC to begin within 24-48 hours post d/c. Life Vest ordered- awaiting approval and fit times. Staff RN to coordinate fit times with the patients mother. Pt qualified for 02 with sat's. CM did call [facility] and they can supply DME RW and 02. CM will make pt aware that she can get shower chair from [facility] vs a Medical Supply.    [id number] 10-31-16 Jacqlyn Krauss, RN,BSN [phone] Medicare is now listed as primary payer. Pt will be able to get PT/OT Services if still needed. CM will speak with pt in regards to disposition needs.  Bethena Roys, RN 10/30/2016, 3:45 PM

## 2016-10-30 NOTE — Progress Notes (Signed)
Speech Language Pathology Treatment: Dysphagia  Patient Details Name: Rachel Duncan MRN: [id number] DOB: January 13, 1981 Today's Date: 10/30/2016 Time: [id number] SLP Time Calculation (min) (ACUTE ONLY): 10 min  Assessment / Plan / Recommendation Clinical Impression  Pt consumed 8 ounces of water without overt s/s of aspiration, even with large, consecutive sips. Her voice is mildly soft but she reports that is sounds at or near her baseline. Recommend advancement to regular textures and thin liquids. SLP will continue to follow for tolerance.   HPI HPI: Rachel Duncan is a 35 y/o woman with history of previous alcohol abuse who was brought to [facility] via EMS after a cardiac arrest at home. Her mother reports that Ms. Dewitte was sitting and looking at her phone after finishing dinner. She suddenly fell and arched back appearing stiff. The family called 20 and CPR was started by her brother until EMS arrived. When EMS arrived she was found to be in V-fib. She received a total of 6 shocks and 1 Duncan of epinephrine before return of spontaneous circulation. In the ED it was noted that her oropharynx was full of debris. Per ED report she was not following commands prior to intubation. From the ED she was taken to the cath lab where she was found to have no significant coronary artery disease and a preserved EF. She was then transferred to the ICU. Upon my arrival to the ICU she appeared to be actively seizing. She was given a total of 8mg  ativan with resolution of seizure activity. Intubated from 12/2 to 12/5, reintubated, self extubated on 12/7.       SLP Plan  Continue with current plan of care     Recommendations  Diet recommendations: Regular;Thin liquid Liquids provided via: Cup;Straw Medication Administration: Whole meds with liquid Supervision: Patient able to self feed;Full supervision/cueing for compensatory strategies Compensations: Slow rate;Small sips/bites Postural Changes and/or  Swallow Maneuvers: Seated upright 90 degrees                Oral Care Recommendations: Oral care BID Follow up Recommendations: 24 hour supervision/assistance Plan: Continue with current plan of care       GO                Germain Osgood 10/30/2016, 4:18 PM  Germain Osgood, M.A. CCC-SLP [phone]

## 2016-10-30 NOTE — Progress Notes (Signed)
Occupational Therapy Treatment Patient Details Name: Rachel Duncan MRN: [id number] DOB: 05/07/1981 Today's Date: 10/30/2016    History of present illness Rachel Duncan is a 35 year old female with history of alcohol abuse who was brought in to [facility] ED via EMS after cardiac arrest at home. Apparently, patient was looking at her phone after finishing dinner when she suddenly fell, arched back appearing stiff. Upon EMS arrival, patient was found to be in V. fib. She received total of 6 shocks and one round of epinephrine and achieved return of spontaneous circulation. Patient was intubated, taken to cath Lab where she was found to have no significant coronary artery disease. She also had seizures when she arrived in the ICU. Pt was intubated, extubated and reintubated on 12/5, self extubated on 12/7   OT comments  This 35 yo female admitted with above presents to acute OT making progress with basic ADLs. Some cognitive skills are different than normal per pt and some are the same. Pt will continue to benefit from acute OT with follow up [place].   Follow Up Recommendations  Home health OT    Equipment Recommendations  Tub/shower seat       Precautions / Restrictions Precautions Precautions: Fall Restrictions Weight Bearing Restrictions: No       Mobility Bed Mobility Overal bed mobility: Modified Independent             General bed mobility comments: In and OOB without A just increased time  Transfers Overall transfer level: Needs assistance Equipment used: None Transfers: Sit to/from Stand Sit to Stand: Supervision         General transfer comment: ambulated over to sink with minguar A    Balance Overall balance assessment: Needs assistance Sitting-balance support: No upper extremity supported;Feet supported Sitting balance-Leahy Scale: Good     Standing balance support: Single extremity supported Standing balance-Leahy Scale: Fair Standing balance comment:  standing at sink to name objects on counter top and what they are used for                   ADL Overall ADL's : Needs assistance/impaired                       Lower Body Dressing Details (indicate cue type and reason): set up doff/don socks sitting EOB       Toileting - Clothing Manipulation Details (indicate cue type and reason): min guard A from bed>sink>back to bed                        Cognition   Behavior During Therapy:  (mainly flat affect with a few smirks during session) Overall Cognitive Status: Impaired/Different from baseline Area of Impairment: Following commands;Safety/judgement;Awareness;Problem solving;Memory        Following Commands: Follows multi-step commands inconsistently (gave her 3 tasks to do in order after I told them all to her--she was able to complete, but used the same number of repetitions when I did not for each task) Safety/Judgement: Decreased awareness of safety;Decreased awareness of deficits Awareness: Emergent Problem Solving: Slow processing;Requires verbal cues;Difficulty sequencing General Comments: I gave pt a math problem where she bought something for $2.38 and paid $3.00--how much money would she get back? (took her a couple of minutes and then she said 60 something). I asked her if this was harder than normal and she said no. I asked her if she does better writting it down  and she said yes--she still had trouble with it and did not come up with correct answer--she reported that this is not the same as normal. Pt stated she sometimes makes mac and cheese and cakes out of a box (I asked her what ingredients she need for both and she was able to tell me)                 Pertinent Vitals/ Pain       Pain Assessment: No/denies pain Faces Pain Scale: Hurts a little bit Pain Location: chest when trying to sit straight up Pain Descriptors / Indicators: Sharp Pain Intervention(s): Repositioned          Frequency  Min 3X/week        Progress Toward Goals  OT Goals(current goals can now be found in the care plan section)  Progress towards OT goals: Progressing toward goals  Acute Rehab OT Goals Patient Stated Goal: return home  Plan Discharge plan remains appropriate       End of Session Equipment Utilized During Treatment: Oxygen   Activity Tolerance Patient tolerated treatment well   Patient Left in bed;with call bell/phone within reach;with bed alarm set   Nurse Communication          Time: [id number] OT Time Calculation (min): 29 min  Charges: OT General Charges $OT Visit: 1 Procedure OT Treatments $Self Care/Home Management : 23-37 mins  Almon Register [id number] 10/30/2016, 2:55 PM

## 2016-10-30 NOTE — Progress Notes (Signed)
Physical Therapy Treatment Patient Details Name: Rachel Duncan MRN: [id number] DOB: 03-30-1981 Today's Date: 10/30/2016    History of Present Illness Rachel Duncan is a 35 year old female with history of alcohol abuse who was brought in to [facility] ED via EMS after cardiac arrest at home. Apparently, patient was looking at her phone after finishing dinner when she suddenly fell, arched back appearing stiff. Upon EMS arrival, patient was found to be in V. fib. She received total of 6 shocks and one round of epinephrine and achieved return of spontaneous circulation. Patient was intubated, taken to cath Lab where she was found to have no significant coronary artery disease. She also had seizures when she arrived in the ICU. Pt was intubated, extubated and reintubated on 12/5, self extubated on 12/7    PT Comments    Pt ambulated increased distance (300') today with less reliance on RW but O2 sats dropping to 79% on RA with inability to converse while walking due to SOB. Supervision still needed for safety as pt with decreased awareness and slow response time. PT will continue to follow.    Follow Up Recommendations  Home health PT;Supervision for mobility/OOB     Equipment Recommendations  Rolling walker with 5" wheels    Recommendations for Other Services       Precautions / Restrictions Precautions Precautions: Fall Restrictions Weight Bearing Restrictions: No    Mobility  Bed Mobility Overal bed mobility: Modified Independent                Transfers Overall transfer level: Needs assistance Equipment used: Rolling walker (2 wheeled) Transfers: Sit to/from Stand Sit to Stand: Supervision         General transfer comment: pt doing better following vc's today and less reliance on RW. Asked pt if she wanted to try ambulating without it but she did not  Ambulation/Gait Ambulation/Gait assistance: Min guard Ambulation Distance (Feet): 300 Feet Assistive device:  Rolling walker (2 wheeled) Gait Pattern/deviations: Step-through pattern;Narrow base of support;Drifts right/left Gait velocity: decreased Gait velocity interpretation: Below normal speed for age/gender General Gait Details: ran into object on L side, decreased pace with inability to change speeds. RR increased to 31, O2 sats dropped to 79% on RA. Needed standing rest break of 2 mins to get sats to 88%. Pt unable to converse while ambulating due to SOB. HR 105 bpm.    Stairs            Wheelchair Mobility    Modified Rankin (Stroke Patients Only)       Balance Overall balance assessment: Needs assistance Sitting-balance support: No upper extremity supported Sitting balance-Leahy Scale: Good     Standing balance support: Bilateral upper extremity supported Standing balance-Leahy Scale: Fair Standing balance comment: pt able to stand without support for short time                    Cognition Arousal/Alertness: Awake/alert Behavior During Therapy: Flat affect Overall Cognitive Status: Impaired/Different from baseline Area of Impairment: Following commands;Safety/judgement;Awareness;Problem solving;Memory       Following Commands: Follows multi-step commands with increased time;Follows one step commands consistently Safety/Judgement: Decreased awareness of safety;Decreased awareness of deficits Awareness: Emergent Problem Solving: Slow processing;Requires verbal cues;Difficulty sequencing General Comments: pt appropriate throughout session today, continued with delayed response time. Unsure of her baseline and no family present to verify    Exercises      General Comments        Pertinent Vitals/Pain  Pain Assessment: Faces Faces Pain Scale: Hurts a little bit Pain Location: chest when trying to sit straight up Pain Descriptors / Indicators: Sharp Pain Intervention(s): Repositioned    Home Living                      Prior Function             PT Goals (current goals can now be found in the care plan section) Acute Rehab PT Goals Patient Stated Goal: return home PT Goal Formulation: With patient Time For Goal Achievement: 11/12/16 Potential to Achieve Goals: Good Progress towards PT goals: Progressing toward goals    Frequency    Min 3X/week      PT Plan Current plan remains appropriate    Co-evaluation             End of Session Equipment Utilized During Treatment: Gait belt Activity Tolerance: Patient tolerated treatment well Patient left: with call bell/phone within reach;in bed;with bed alarm set     Time: [id number] PT Time Calculation (min) (ACUTE ONLY): 20 min  Charges:  $Gait Training: 8-22 mins                    G Codes:     [place] 10/30/2016, 1:17 PM

## 2016-10-30 NOTE — Progress Notes (Signed)
PROGRESS NOTE    Rachel Duncan  TDS:[id number] DOB: 1980-12-29 DOA: 10/21/2016 PCP: No primary care provider on file.     Brief Narrative:  Rachel Duncan is a 35 year old female with history of alcohol abuse who was brought in to [facility] ED via EMS after cardiac arrest at home. Apparently, patient was looking at her phone after finishing dinner when she suddenly fell, arched back appearing stiff. Upon EMS arrival, patient was found to be in V. fib. She received total of 6 shocks and one round of epinephrine and achieved return of spontaneous circulation. Patient was intubated, taken to cath Lab where she was found to have no significant coronary artery disease. She also had seizures when she arrived in the ICU. Etiology of her arrest could be due to seizure leading to arrhythmia or arrhythmia leading to hypoperfusion and seizure. Neurology, cardiology have been following patient.    Assessment & Plan:   Principal Problem:   Cardiac arrest [facility]) Active Problems:   Acute respiratory failure with hypoxia (HCC)   Acute encephalopathy   Anoxic encephalopathy (HCC)   Seizure (HCC)  V. fib arrest -Heart cath on 12/2 with normal coronary arteries and preserved EF -Received cooling protocol while in ICU -Appreciate cardiology, patient needs TTE limited prior to discharge to re-evaluate LV systolic function -Now with ventricular bigeminy, occasional trigeminy on tele  -Continue metoprolol TID   Seizure -Appreciate neurology -EEG 12/4: EEG is markedly abnormal due to diffuse background suppression and lack of EEG reactivity.  There was no epileptiform activity on this recording, but this does not rule out seizure disorder. -MRI brain: unremarkable -LP 12/9: WBC 2, protein 15, glucose 60. Culture pending.  -No further work up. No long-term antiepileptics without seizure hx or clear predisposition  -No driving for 6 months.   Lower abdominal pain -AXR and pelvic US unrevealing, +some pelvic  fluid. Having BM and flatus. Is currently on menstrual cycle. Monitor. Abdominal pain resolved today.   Bilateral leg heaviness/tremor -Post-LP, now resolved  -MRI lumbar unremarkable  Acute encephalopathy  -Likely secondary to prolonged arrest. Resolved.   Acute hypoxemic respiratory failure -Secondary to inability to protect airway, aspiration  -Intubated on admission, extubated 12/5, re-intubated 12/5, and self extubated 12/7 -Continue [place] O2, wean as able   Pyuria -Leukocytes and nitrite in Ua, but only 6-30 WBC. Urine culture not available.  -Received 7 day course rocephin  Elevated liver enzymes -Likely due to arrest, shock liver -Trend LFT, improving with supportive care   History of alcohol abuse -Last alcoholic drink was Thanksgiving   DVT prophylaxis: SCDs Code Status: Full Family Communication: no family at bedside Disposition Plan: pending further improvement. HHPT/OT on discharge.    Consultants:   PCCM  Cardiology  Neurology   Procedures:  -Heart cath on 12/2 with normal coronary arteries and preserved EF -EEG 12/4: EEG is markedly abnormal due to diffuse background suppression and lack of EEG reactivity.  There was no epileptiform activity on this recording, but this does not rule out seizure disorder. -LP 12/8: difficult tap -LP 12/9: WBC 2, protein 15, glucose 60.  Antimicrobials:  Anti-infectives    Start     Dose/Rate Route Frequency Ordered Stop   10/22/16 0500  cefTRIAXone (ROCEPHIN) 2 g in dextrose 5 % 50 mL IVPB     2 g 100 mL/hr over 30 Minutes Intravenous Daily 10/22/16 0436 10/28/16 0623       Subjective: Patient feeling well today. No complaints of chest pain, shortness  of breath, palpitations. Abdominal pain has now resolved.    Objective: Vitals:   10/29/16 2117 10/29/16 2130 10/29/16 2315 10/30/16 0644  BP:  125/75  129/88  Pulse:  76  76  Resp: 19 19 (!) 22 18  Temp:  98.2 F (36.8 C)  97.9 F (36.6 C)  TempSrc:  Oral   Oral  SpO2:  99%  98%  Weight:    74.4 kg (164 lb 1.6 oz)  Height:        Intake/Output Summary (Last 24 hours) at 10/30/16 0844 Last data filed at 10/29/16 2300  Gross per 24 hour  Intake              290 ml  Output                0 ml  Net              290 ml   Filed Weights   10/27/16 0300 10/28/16 0426 10/30/16 0644  Weight: 79.1 kg (174 lb 6.1 oz) 75.4 kg (166 lb 3.2 oz) 74.4 kg (164 lb 1.6 oz)    Examination:  General exam: Appears calm and comfortable  Respiratory system: Clear to auscultation. Respiratory effort normal. Cardiovascular system: S1 & S2 heard, RRR. No JVD, murmurs, rubs, gallops or clicks. No pedal edema. Gastrointestinal system: Abdomen is nondistended, soft and nontender. No organomegaly or masses felt. Normal bowel sounds heard. Central nervous system: Alert and oriented. No focal neurological deficits. Extremities: Symmetric 5 x 5 power. Skin: No rashes, lesions or ulcers Psychiatry: Judgement and insight appear normal. Mood & affect appropriate.   Data Reviewed: I have personally reviewed following labs and imaging studies  CBC:  Recent Labs Lab 10/26/16 0300 10/27/16 0900 10/28/16 0435 10/29/16 0244 10/30/16 0514  WBC 5.7 4.7 3.9* 5.4 6.5  NEUTROABS  --   --   --  2.3 3.0  HGB 7.9* 8.6* 9.6* 9.9* 10.5*  HCT 23.6* 26.0* 29.0* 29.1* 31.7*  MCV 86.8 88.1 87.6 86.9 88.8  PLT 171 192 217 241 626   Basic Metabolic Panel:  Recent Labs Lab 10/24/16 0300 10/25/16 0330 10/26/16 0300 10/27/16 0400 10/27/16 0900 10/28/16 0435 10/29/16 0244 10/30/16 0514  NA 137 136 135 137  --  136 135 137  K 4.0 4.0 3.1* 4.0  --  4.0 3.5 3.7  CL 112* 108 105 106  --  102 101 103  CO2 19* 19* 20* 21*  --  _0 GLUCOSE 92 101* 101* 90  --  97 87 86  BUN 5* <5* <5* <5*  --  _1 CREATININE 0.74 0.78 0.87 0.81  --  0.79 0.79 0.83  CALCIUM 8.2* 8.7* 8.5* 8.8*  --  9.0 8.8* 9.0  MG 1.7 2.1 1.7 1.9  --  2.0  --  1.9  PHOS 3.2  --  2.7  --  4.9* 4.3   --   --    GFR: Estimated Creatinine Clearance: 91 mL/min (by C-G formula based on SCr of 0.83 mg/dL). Liver Function Tests:  Recent Labs Lab 10/25/16 0330 10/27/16 0900 10/30/16 0514  AST 137* 69* 28  ALT 253* 143* 67*  ALKPHOS 55 45 52  BILITOT 0.7 0.6 0.6  PROT 6.0* 6.3* 6.3*  ALBUMIN 2.8* 2.8* 3.0*   No results for input(s): LIPASE, AMYLASE in the last 168 hours.  Recent Labs Lab 10/27/16 0900  AMMONIA 11   Coagulation Profile:  Recent Labs Lab 10/24/16 0300  INR 1.28   Cardiac Enzymes: No results for input(s): CKTOTAL, CKMB, CKMBINDEX, TROPONINI in the last 168 hours. BNP (last 3 results) No results for input(s): PROBNP in the last 8760 hours. HbA1C: No results for input(s): HGBA1C in the last 72 hours. CBG:  Recent Labs Lab 10/27/16 2357 10/28/16 0422 10/28/16 0747 10/28/16 1137 10/28/16 1637  GLUCAP 77 94 89 88 89   Lipid Profile: No results for input(s): CHOL, HDL, LDLCALC, TRIG, CHOLHDL, LDLDIRECT in the last 72 hours. Thyroid Function Tests: No results for input(s): TSH, T4TOTAL, FREET4, T3FREE, THYROIDAB in the last 72 hours. Anemia Panel: No results for input(s): VITAMINB12, FOLATE, FERRITIN, TIBC, IRON, RETICCTPCT in the last 72 hours. Sepsis Labs:  Recent Labs Lab 10/24/16 0300  LATICACIDVEN 0.6    Recent Results (from the past 240 hour(s))  MRSA PCR Screening     Status: None   Collection Time: 10/22/16  1:05 AM  Result Value Ref Range Status   MRSA by PCR NEGATIVE NEGATIVE Final    Comment:        The GeneXpert MRSA Assay (FDA approved for NASAL specimens only), is one component of a comprehensive MRSA colonization surveillance program. It is not intended to diagnose MRSA infection nor to guide or monitor treatment for MRSA infections.   Culture, respiratory (NON-Expectorated)     Status: None   Collection Time: 10/22/16  8:52 AM  Result Value Ref Range Status   Specimen Description TRACHEAL ASPIRATE  Final   Special  Requests Normal  Final   Gram Stain   Final    DEGENERATED CELLULAR MATERIAL PRESENT RARE SQUAMOUS EPITHELIAL CELLS PRESENT FEW GRAM POSITIVE COCCI IN PAIRS RARE GRAM POSITIVE RODS RARE GRAM NEGATIVE COCCI IN PAIRS    Culture Consistent with normal respiratory flora.  Final   Report Status 10/24/2016 FINAL  Final  Culture, blood (routine x 2)     Status: None   Collection Time: 10/22/16  9:08 AM  Result Value Ref Range Status   Specimen Description BLOOD A-LINE  Final   Special Requests BOTTLES DRAWN AEROBIC AND ANAEROBIC 5CC  Final   Culture NO GROWTH 5 DAYS  Final   Report Status 10/27/2016 FINAL  Final  Culture, blood (routine x 2)     Status: None   Collection Time: 10/22/16  9:18 AM  Result Value Ref Range Status   Specimen Description BLOOD LEFT ANTECUBITAL  Final   Special Requests IN PEDIATRIC BOTTLE 3.0CC  Final   Culture NO GROWTH 5 DAYS  Final   Report Status 10/27/2016 FINAL  Final  CSF culture     Status: None (Preliminary result)   Collection Time: 10/28/16  2:00 PM  Result Value Ref Range Status   Specimen Description CSF  Final   Special Requests NONE  Final   Gram Stain   Final    WBC PRESENT, PREDOMINANTLY MONONUCLEAR NO ORGANISMS SEEN CYTOSPIN SMEAR    Culture NO GROWTH < 24 HOURS  Final   Report Status PENDING  Incomplete       Radiology Studies: Mr Lumbar Spine Wo Contrast  Result Date: 10/29/2016 CLINICAL DATA:  Initial evaluation for new onset seizure and setting of cardiac arrest. Bilateral leg heaviness. Recent lumbar puncture. EXAM: MRI LUMBAR SPINE WITHOUT CONTRAST TECHNIQUE: Multiplanar, multisequence MR imaging of the lumbar spine was performed. No intravenous contrast was administered. COMPARISON:  Prior radiographs from 10/28/2016 related a lumbar puncture. FINDINGS: Segmentation: Transitional lumbosacral anatomy with sacralization of the L5 vertebral body. L5-S1 disc is  somewhat rudimentary. Alignment: Vertebral bodies normally aligned with  preservation of the normal lumbar lordosis. No listhesis. Vertebrae: Vertebral body heights are well maintained. No evidence for acute or chronic fracture. Signal intensity within the vertebral body bone marrow within normal limits. No focal osseous lesions. No abnormal marrow edema. Conus medullaris: Extends to the L1 level and appears normal. No epidural collection or other abnormality identified status post recent lumbar puncture. Paraspinal and other soft tissues: Mild fluid signal intensity within the left paraspinous musculature at the L2-3 levels, suspected to be related to recent lumbar puncture (series 6, image 13). Mild subcutaneous edema. Paraspinous soft tissues are otherwise within normal limits. Moderate distension noted of the partially visualized urinary bladder. Remainder of the visualized visceral structures are within normal limits. Disc levels: No significant degenerative changes are identified within the lumbar spine. No disc bulge or disc protrusion. Intervertebral discs are well hydrated. No significant facet arthropathy. No canal or foraminal stenosis. IMPRESSION: 1. Essentially normal MRI of the lumbar spine. No acute abnormality identified to explain patient's symptoms. 2. Mild induration within the left posterior paraspinous musculature, likely related to recent lumbar puncture. No complication identified status post recent intervention. 3. Moderate distention of the partially visualized urinary bladder. 4. Transitional lumbosacral anatomy with sacralization of the L5 vertebral body. Electronically Signed   By: Jeannine Boga M.D.   On: 10/29/2016 01:45   US Transvaginal Non-ob  Result Date: 10/29/2016 CLINICAL DATA:  Lower abdominal and pelvic pain. EXAM: TRANSABDOMINAL AND TRANSVAGINAL ULTRASOUND OF PELVIS TECHNIQUE: Both transabdominal and transvaginal ultrasound examinations of the pelvis were performed. Transabdominal technique was performed for global imaging of the pelvis  including uterus, ovaries, adnexal regions, and pelvic cul-de-sac. It was necessary to proceed with endovaginal exam following the transabdominal exam to visualize the bilateral adnexa. COMPARISON:  None FINDINGS: Uterus Measurements: 7.4 x 3.1 x 3.8 cm. No fibroids or other mass visualized. Endometrium Thickness: 3 mm.  No focal abnormality visualized. Right ovary Measurements: 3.1 x 2.4 x 2.5 cm. Normal appearance/no adnexal mass. Left ovary Measurements: 2.6 x 1.9 x 1.7 cm. Normal appearance/no adnexal mass. Other findings Moderate amount of isoechoic free fluid in the pelvis. IMPRESSION: Normal appearance of the uterus and bilateral ovaries. Moderate amount of isoechoic free fluid in the pelvis, which may represent hemorrhagic fluid. Electronically Signed   By: Fidela Salisbury M.D.   On: 10/29/2016 17:11   US Pelvis Complete  Result Date: 10/29/2016 CLINICAL DATA:  Lower abdominal and pelvic pain. EXAM: TRANSABDOMINAL AND TRANSVAGINAL ULTRASOUND OF PELVIS TECHNIQUE: Both transabdominal and transvaginal ultrasound examinations of the pelvis were performed. Transabdominal technique was performed for global imaging of the pelvis including uterus, ovaries, adnexal regions, and pelvic cul-de-sac. It was necessary to proceed with endovaginal exam following the transabdominal exam to visualize the bilateral adnexa. COMPARISON:  None FINDINGS: Uterus Measurements: 7.4 x 3.1 x 3.8 cm. No fibroids or other mass visualized. Endometrium Thickness: 3 mm.  No focal abnormality visualized. Right ovary Measurements: 3.1 x 2.4 x 2.5 cm. Normal appearance/no adnexal mass. Left ovary Measurements: 2.6 x 1.9 x 1.7 cm. Normal appearance/no adnexal mass. Other findings Moderate amount of isoechoic free fluid in the pelvis. IMPRESSION: Normal appearance of the uterus and bilateral ovaries. Moderate amount of isoechoic free fluid in the pelvis, which may represent hemorrhagic fluid. Electronically Signed   By: Fidela Salisbury M.D.   On: 10/29/2016 17:11   Dg Abd Portable 1v  Result Date: 10/29/2016 CLINICAL DATA:  Abdominal pain at umbilical  region for 2 days EXAM: PORTABLE ABDOMEN - 1 VIEW COMPARISON:  None FINDINGS: Scattered gas and stool throughout colon to rectum. Small bowel gas pattern normal. No evidence of bowel dilatation or bowel wall thickening. Osseous structures unremarkable. Scattered pelvic phleboliths without definite urinary tract calcification. IMPRESSION: Nonspecific bowel gas pattern. Electronically Signed   By: Lavonia Dana M.D.   On: 10/29/2016 13:04   Dg Fluoro Guide Lumbar Puncture  Result Date: 10/28/2016 CLINICAL DATA:  Patient with history of seizure. EXAM: DIAGNOSTIC LUMBAR PUNCTURE UNDER FLUOROSCOPIC GUIDANCE FLUOROSCOPY TIME:  Fluoroscopy Time:  36 seconds PROCEDURE: Informed consent was obtained from the patient prior to the procedure, including potential complications of headache, allergy, and pain. With the patient prone, the lower back was prepped with Betadine. 1% Lidocaine was used for local anesthesia. Lumbar puncture was performed at the L3-4 level using a 20 gauge needle with return of clear CSF. No opening pressure was obtained due to patient's inability to move. 8 ml of CSF were obtained for laboratory studies. The patient tolerated the procedure well and there were no apparent complications. IMPRESSION: Technically successful lumbar puncture. Fluid sent to laboratory for analysis. Electronically Signed   By: Lovey Newcomer M.D.   On: 10/28/2016 14:40      Scheduled Meds: . chlorhexidine gluconate (MEDLINE KIT)  15 mL Mouth Rinse BID  . Influenza vac split quadrivalent PF  0.5 mL Intramuscular Tomorrow-1000  . iopamidol      . mouth rinse  15 mL Mouth Rinse BID  . metoprolol tartrate  25 mg Oral TID   Continuous Infusions:   LOS: 8 days    Time spent: 30 minutes   Dessa Phi, DO [facility] [email or web address] Password [facility] 10/30/2016, 8:44 AM

## 2016-10-30 NOTE — Progress Notes (Signed)
Ref: No primary care provider on file.   Subjective:  No chest pain or shortness of breath. Prior echocardiogram with poor LV systolic function.  Objective:  Vital Signs in the last 24 hours: Temp:  [97.9 F (36.6 C)-99.3 F (37.4 C)] 97.9 F (36.6 C) (12/11 0644) Pulse Rate:  [40-86] 76 (12/11 0644) Cardiac Rhythm: Normal sinus rhythm (12/11 0749) Resp:  [18-25] 18 (12/11 0644) BP: (125-139)/(68-97) 129/88 (12/11 0644) SpO2:  [96 %-99 %] 98 % (12/11 0644) Weight:  [74.4 kg (164 lb 1.6 oz)] 74.4 kg (164 lb 1.6 oz) (12/11 4627)  Physical Exam: BP Readings from Last 1 Encounters:  10/30/16 129/88    Wt Readings from Last 1 Encounters:  10/30/16 74.4 kg (164 lb 1.6 oz)    Weight change:  Body mass index is 30.01 kg/m. HEENT: [place]/AT, Eyes-Brown, PERL, EOMI, Conjunctiva-Pink, Sclera-Non-icteric Neck: No JVD, No bruit, Trachea midline. Lungs:  Clear, Bilateral. Cardiac:  Regular rhythm, normal S1 and S2, no S3. II/VI systolic murmur. Abdomen:  Soft, non-tender. BS present. Extremities:  No edema present. No cyanosis. No clubbing. CNS: AxOx3, Cranial nerves grossly intact, moves all 4 extremities.  Skin: Warm and dry.   Intake/Output from previous day: 12/10 0701 - 12/11 0700 In: 290 [P.O.:290] Out: -     Lab Results: BMET    Component Value Date/Time   NA 137 10/30/2016 0514   NA 135 10/29/2016 0244   NA 136 10/28/2016 0435   K 3.7 10/30/2016 0514   K 3.5 10/29/2016 0244   K 4.0 10/28/2016 0435   CL 103 10/30/2016 0514   CL 101 10/29/2016 0244   CL 102 10/28/2016 0435   CO2 23 10/30/2016 0514   CO2 27 10/29/2016 0244   CO2 24 10/28/2016 0435   GLUCOSE 86 10/30/2016 0514   GLUCOSE 87 10/29/2016 0244   GLUCOSE 97 10/28/2016 0435   BUN 6 10/30/2016 0514   BUN 6 10/29/2016 0244   BUN 7 10/28/2016 0435   CREATININE 0.83 10/30/2016 0514   CREATININE 0.79 10/29/2016 0244   CREATININE 0.79 10/28/2016 0435   CALCIUM 9.0 10/30/2016 0514   CALCIUM 8.8 (L) 10/29/2016  0244   CALCIUM 9.0 10/28/2016 0435   GFRNONAA >60 10/30/2016 0514   GFRNONAA >60 10/29/2016 0244   GFRNONAA >60 10/28/2016 0435   GFRAA >60 10/30/2016 0514   GFRAA >60 10/29/2016 0244   GFRAA >60 10/28/2016 0435   CBC    Component Value Date/Time   WBC 6.5 10/30/2016 0514   RBC 3.57 (L) 10/30/2016 0514   HGB 10.5 (L) 10/30/2016 0514   HCT 31.7 (L) 10/30/2016 0514   PLT 285 10/30/2016 0514   MCV 88.8 10/30/2016 0514   MCH 29.4 10/30/2016 0514   MCHC 33.1 10/30/2016 0514   RDW 13.7 10/30/2016 0514   LYMPHSABS 2.8 10/30/2016 0514   MONOABS 0.6 10/30/2016 0514   EOSABS 0.1 10/30/2016 0514   BASOSABS 0.0 10/30/2016 0514   HEPATIC Function Panel  Recent Labs  10/25/16 0330 10/27/16 0900 10/30/16 0514  PROT 6.0* 6.3* 6.3*   HEMOGLOBIN A1C No components found for: HGA1C,  MPG CARDIAC ENZYMES Lab Results  Component Value Date   CKTOTAL 3,820 (H) 10/22/2016   CKMB 81.5 (H) 10/22/2016   TROPONINI 0.68 (HH) 10/22/2016   TROPONINI 0.81 (HH) 10/22/2016   TROPONINI 1.19 (HH) 10/22/2016   BNP No results for input(s): PROBNP in the last 8760 hours. TSH No results for input(s): TSH in the last 8760 hours. CHOLESTEROL No results for  input(s): CHOL in the last 8760 hours.  Scheduled Meds: . chlorhexidine gluconate (MEDLINE KIT)  15 mL Mouth Rinse BID  . Influenza vac split quadrivalent PF  0.5 mL Intramuscular Tomorrow-1000  . mouth rinse  15 mL Mouth Rinse BID  . metoprolol tartrate  25 mg Oral TID   Continuous Infusions: PRN Meds:.sodium chloride, RESOURCE THICKENUP CLEAR, traMADol  Assessment/Plan: S/P V. Fib cardiac arrest Seizure disorder S/P hypoxic respiratory failure Dilated cardiomyopathy Tobacco use disorder Alcohol use disorder Anoxic encephalopathy-resolved Hypokalemia-resolved  Consider LifeVest placement. Limited echocardiogram for LV function   LOS: 8 days    Dixie Dials  MD  10/30/2016, 1:42 PM

## 2016-10-30 NOTE — Progress Notes (Signed)
SATURATION QUALIFICATIONS: (This note is used to comply with regulatory documentation for home oxygen)  Patient Saturations on Room Air at Rest = 92%  Patient Saturations on Room Air while Ambulating = 79%  Patient Saturations on 2 Liters of oxygen while Ambulating = 92%  Please briefly explain why patient needs home oxygen: pt with significant O2 desaturation when ambulating on RA.   Rachel Duncan, [place]  [phone]

## 2016-10-31 ENCOUNTER — Inpatient Hospital Stay (HOSPITAL_COMMUNITY): Payer: Medicare Other

## 2016-10-31 ENCOUNTER — Telehealth: Payer: Self-pay

## 2016-10-31 DIAGNOSIS — R0902 Hypoxemia: Secondary | ICD-10-CM

## 2016-10-31 LAB — ECHOCARDIOGRAM COMPLETE
CHL CUP DOP CALC LVOT VTI: 18.1 cm
E/e' ratio: 6.45
EWDT: 349 ms
FS: 30 % (ref 28–44)
HEIGHTINCHES: 62 in
IVS/LV PW RATIO, ED: 0.84
LA ID, A-P, ES: 31 mm
LA diam index: 1.68 cm/m2
LA vol A4C: 35.9 ml
LA vol index: 22.4 mL/m2
LAVOL: 41.3 mL
LEFT ATRIUM END SYS DIAM: 31 mm
LV PW d: 13.3 mm — AB (ref 0.6–1.1)
LV TDI E'MEDIAL: 7.72
LV e' LATERAL: 7.18 cm/s
LVEEAVG: 6.45
LVEEMED: 6.45
LVOT area: 3.14 cm2
LVOTD: 20 mm
LVOTPV: 85.1 cm/s
LVOTSV: 57 mL
MV Dec: 349
MVPKAVEL: 38.4 m/s
MVPKEVEL: 46.3 m/s
RV LATERAL S' VELOCITY: 14.4 cm/s
RV TAPSE: 27.3 mm
TDI e' lateral: 7.18
WEIGHTICAEL: 2657.6 [oz_av]

## 2016-10-31 LAB — BASIC METABOLIC PANEL
ANION GAP: 11 (ref 5–15)
BUN: 6 mg/dL (ref 6–20)
CALCIUM: 9.2 mg/dL (ref 8.9–10.3)
CO2: 24 mmol/L (ref 22–32)
Chloride: 101 mmol/L (ref 101–111)
Creatinine, Ser: 0.87 mg/dL (ref 0.44–1.00)
GFR calc Af Amer: >60 mL/min (ref >60)
GFR calc non Af Amer: >60 mL/min (ref >60)
GLUCOSE: 83 mg/dL (ref 65–99)
POTASSIUM: 3.8 mmol/L (ref 3.5–5.1)
SODIUM: 136 mmol/L (ref 135–145)

## 2016-10-31 LAB — CBC WITH DIFFERENTIAL/PLATELET
BASOS ABS: 0 10*3/uL (ref 0.0–0.1)
Basophils Relative: 0 %
EOS ABS: 0.1 10*3/uL (ref 0.0–0.7)
EOS PCT: 1 %
HCT: 32.1 % — ABNORMAL LOW (ref 36.0–46.0)
Hemoglobin: 10.4 g/dL — ABNORMAL LOW (ref 12.0–15.0)
LYMPHS PCT: 46 %
Lymphs Abs: 3.1 10*3/uL (ref 0.7–4.0)
MCH: 29 pg (ref 26.0–34.0)
MCHC: 32.4 g/dL (ref 30.0–36.0)
MCV: 89.4 fL (ref 78.0–100.0)
MONO ABS: 0.4 10*3/uL (ref 0.1–1.0)
Monocytes Relative: 7 %
Neutro Abs: 3.2 10*3/uL (ref 1.7–7.7)
Neutrophils Relative %: 46 %
PLATELETS: 287 10*3/uL (ref 150–400)
RBC: 3.59 MIL/uL — AB (ref 3.87–5.11)
RDW: 13.9 % (ref 11.5–15.5)
WBC: 6.8 10*3/uL (ref 4.0–10.5)

## 2016-10-31 NOTE — Progress Notes (Signed)
Ref: No primary care provider on file.   Subjective:  No new complaints. Awaiting LifeVest placement. Sinus rhythm on monitor.  Objective:  Vital Signs in the last 24 hours: Temp:  [98.7 F (37.1 C)] 98.7 F (37.1 C) (12/12 0500) Pulse Rate:  [72] 72 (12/12 0500) Cardiac Rhythm: Normal sinus rhythm (12/12 0755) Resp:  [20-22] 20 (12/12 0500) BP: (119-125)/(75-86) 125/86 (12/12 0500) SpO2:  [95 %] 95 % (12/12 0500) Weight:  [75.3 kg (166 lb 1.6 oz)] 75.3 kg (166 lb 1.6 oz) (12/12 0500)  Physical Exam: BP Readings from Last 1 Encounters:  10/31/16 125/86    Wt Readings from Last 1 Encounters:  10/31/16 75.3 kg (166 lb 1.6 oz)    Weight change: 0.907 kg (2 lb) Body mass index is 30.38 kg/m. HEENT: [place]/AT, Eyes-Brown, PERL, EOMI, Conjunctiva-Pink, Sclera-Non-icteric Neck: No JVD, No bruit, Trachea midline. Lungs:  Clear, Bilateral. Cardiac:  Regular rhythm, normal S1 and S2, no S3. II/VI systolic murmur. Abdomen:  Soft, non-tender. BS present. Extremities:  No edema present. No cyanosis. No clubbing. CNS: AxOx3, Cranial nerves grossly intact, moves all 4 extremities.  Skin: Warm and dry.   Intake/Output from previous day: 12/11 0701 - 12/12 0700 In: 360 [P.O.:360] Out: -     Lab Results: BMET    Component Value Date/Time   NA 136 10/31/2016 0504   NA 137 10/30/2016 0514   NA 135 10/29/2016 0244   K 3.8 10/31/2016 0504   K 3.7 10/30/2016 0514   K 3.5 10/29/2016 0244   CL 101 10/31/2016 0504   CL 103 10/30/2016 0514   CL 101 10/29/2016 0244   CO2 24 10/31/2016 0504   CO2 23 10/30/2016 0514   CO2 27 10/29/2016 0244   GLUCOSE 83 10/31/2016 0504   GLUCOSE 86 10/30/2016 0514   GLUCOSE 87 10/29/2016 0244   BUN 6 10/31/2016 0504   BUN 6 10/30/2016 0514   BUN 6 10/29/2016 0244   CREATININE 0.87 10/31/2016 0504   CREATININE 0.83 10/30/2016 0514   CREATININE 0.79 10/29/2016 0244   CALCIUM 9.2 10/31/2016 0504   CALCIUM 9.0 10/30/2016 0514   CALCIUM 8.8 (L)  10/29/2016 0244   GFRNONAA >60 10/31/2016 0504   GFRNONAA >60 10/30/2016 0514   GFRNONAA >60 10/29/2016 0244   GFRAA >60 10/31/2016 0504   GFRAA >60 10/30/2016 0514   GFRAA >60 10/29/2016 0244   CBC    Component Value Date/Time   WBC 6.8 10/31/2016 0504   RBC 3.59 (L) 10/31/2016 0504   HGB 10.4 (L) 10/31/2016 0504   HCT 32.1 (L) 10/31/2016 0504   PLT 287 10/31/2016 0504   MCV 89.4 10/31/2016 0504   MCH 29.0 10/31/2016 0504   MCHC 32.4 10/31/2016 0504   RDW 13.9 10/31/2016 0504   LYMPHSABS 3.1 10/31/2016 0504   MONOABS 0.4 10/31/2016 0504   EOSABS 0.1 10/31/2016 0504   BASOSABS 0.0 10/31/2016 0504   HEPATIC Function Panel  Recent Labs  10/25/16 0330 10/27/16 0900 10/30/16 0514  PROT 6.0* 6.3* 6.3*   HEMOGLOBIN A1C No components found for: HGA1C,  MPG CARDIAC ENZYMES Lab Results  Component Value Date   CKTOTAL 3,820 (H) 10/22/2016   CKMB 81.5 (H) 10/22/2016   TROPONINI 0.68 (HH) 10/22/2016   TROPONINI 0.81 (HH) 10/22/2016   TROPONINI 1.19 (HH) 10/22/2016   BNP No results for input(s): PROBNP in the last 8760 hours. TSH No results for input(s): TSH in the last 8760 hours. CHOLESTEROL No results for input(s): CHOL in the last  8760 hours.  Scheduled Meds: . chlorhexidine gluconate (MEDLINE KIT)  15 mL Mouth Rinse BID  . Influenza vac split quadrivalent PF  0.5 mL Intramuscular Tomorrow-1000  . mouth rinse  15 mL Mouth Rinse BID  . metoprolol tartrate  25 mg Oral TID   Continuous Infusions: PRN Meds:.sodium chloride, RESOURCE THICKENUP CLEAR, traMADol  Assessment/Plan: S/P V. Fib cardiac arrest Seizure disorder S/P hypoxic respiratory failure Dilated cardiomyopathy Tobacco use disorder Alcohol use disorder Anoxic encephalopathy-improved  Awaiting LifeVest application. F/U 1 month-Dr. Adrian Prows   LOS: 9 days    Dixie Dials  MD  10/31/2016, 2:07 PM

## 2016-10-31 NOTE — Progress Notes (Signed)
Speech Language Pathology Discharge Patient Details Name: Maniya Donovan MRN: [id number] DOB: June 19, 1981 Today's Date: 10/31/2016 Time: 6219-4712 SLP Time Calculation (min) (ACUTE ONLY): 15 min  Patient discharged from SLP services secondary to goals met and no further SLP needs identified.  Please see latest therapy progress note for current level of functioning and progress toward goals.    Progress and discharge plan discussed with patient and/or caregiver: Patient/Caregiver agrees with plan   Skilled treatment session focused on dysphagia goals. Pt consumed regular diet with thin liquids via straw without any overt s/s of aspiration. Nursing and pt endorse toleration of current diet. All questions answered to pt satisfaction at this time. No further skilled ST is indicated.      GO     Channing Yeager 10/31/2016, 5:07 PM

## 2016-10-31 NOTE — Progress Notes (Addendum)
PROGRESS NOTE    Rachel Duncan  NRX:[id number] DOB: 1981-09-30 DOA: 10/21/2016 PCP: No primary care provider on file.     Brief Narrative:  Rachel Duncan is a 35 year old female with history of alcohol abuse who was brought in to [facility] ED via EMS after cardiac arrest at home. Apparently, patient was looking at her phone after finishing dinner when she suddenly went limp and fell, arched back appearing stiff. Upon EMS arrival, patient was found to be in V. fib. She received total of 6 shocks and one round of epinephrine and achieved return of spontaneous circulation. Patient was intubated, taken to cath lab where she was found to have no significant coronary artery disease. She also had seizures when she arrived in the ICU. Etiology of her arrest could be due to seizure leading to arrhythmia or arrhythmia leading to hypoperfusion and seizure. Neurology, cardiology have been following patient.   She underwent MRI brain, LP, EEG. After neurologic work up, neurology does not feel that her seizure was the precipitant to her arrest, more likely an arrhythmia which lead to hypoxia, then seizure? Neurology now signed off and antiepileptics stopped.    Assessment & Plan:   Principal Problem:   Cardiac arrest [facility]) Active Problems:   Acute respiratory failure with hypoxia (HCC)   Acute encephalopathy   Anoxic encephalopathy (HCC)   Seizure (HCC)   Hypoxemia  V. fib arrest -Heart cath on 12/2 with normal coronary arteries and preserved EF -Received cooling protocol while in ICU -Has had intermittent ventricular bigeminy, occasional trigeminy on telemetry, metoprolol dose increased to TID.  -Repeat echo today to evaluate LV function. Considering LifeVest prior to DC  -Cardiology following   Seizure -EEG 12/4: EEG is markedly abnormal due to diffuse background suppression and lack of EEG reactivity.  There was no epileptiform activity on this recording, but this does not rule out seizure  disorder. -MRI brain: unremarkable -LP 12/9: WBC 2, protein 15, glucose 60. Culture pending, no growth to date  -Appreciate neurology, now signed off  -No further work up. No long-term antiepileptics without seizure hx or clear predisposition  -Per [facility] statutes, patients with seizures are not allowed to drive until  they have been seizure-free for six months. Use caution when using heavy equipment or power tools. Avoid working on ladders or at heights. Take showers instead of baths. Ensure the water temperature is not too high on the home water heater. Do not go swimming alone. When caring for infants or small children, sit down when holding, feeding, or changing them to minimize risk of injury to the child in the event you have a seizure.    Lower abdominal pain -AXR and pelvic US unrevealing, +some pelvic fluid. Having BM and flatus. Is currently on menstrual cycle. Monitor. Abdominal pain resolved.   Bilateral leg heaviness/tremor -Post-LP, now resolved. MRI lumbar unremarkable  Acute encephalopathy  -Likely secondary to prolonged arrest. Resolved, back to baseline.   Acute hypoxemic respiratory failure -Secondary to inability to protect airway, aspiration  -Intubated on admission, extubated 12/5, re-intubated 12/5, and self extubated 12/7 -Continue [place] O2, wean as able  -CXR 12/11 with some atelectasis, encourage incentive spirometry   Pyuria -Leukocytes and nitrite in Ua, but only 6-30 WBC. Urine culture not available. Received 7 day course rocephin.   Elevated liver enzymes -Likely due to arrest, shock liver. Improving with supportive care   History of alcohol abuse -Last alcoholic drink was Thanksgiving   DVT prophylaxis: SCDs  Code Status: Full Family Communication: no family at bedside Disposition Plan: HHPT/OT on discharge.    Consultants:   PCCM  Cardiology  Neurology   Procedures:  -Heart cath on 12/2 with normal coronary arteries and preserved  EF -EEG 12/4: EEG is markedly abnormal due to diffuse background suppression and lack of EEG reactivity.  There was no epileptiform activity on this recording, but this does not rule out seizure disorder. -LP 12/8: difficult tap -LP 12/9: WBC 2, protein 15, glucose 60.  Antimicrobials:  Anti-infectives    Start     Dose/Rate Route Frequency Ordered Stop   10/22/16 0500  cefTRIAXone (ROCEPHIN) 2 g in dextrose 5 % 50 mL IVPB     2 g 100 mL/hr over 30 Minutes Intravenous Daily 10/22/16 0436 10/28/16 0623       Subjective: Patient feeling well today. No complaints of chest pain, shortness of breath, palpitations. Abdominal pain has resolved.    Objective: Vitals:   10/30/16 1700 10/30/16 2142 10/30/16 2226 10/31/16 0500  BP: 121/75 119/77  125/86  Pulse:    72  Resp:  (!) 22 (!) 22 20  Temp:    98.7 F (37.1 C)  TempSrc:    Oral  SpO2:    95%  Weight:    75.3 kg (166 lb 1.6 oz)  Height:        Intake/Output Summary (Last 24 hours) at 10/31/16 0841 Last data filed at 10/30/16 2200  Gross per 24 hour  Intake              360 ml  Output                0 ml  Net              360 ml   Filed Weights   10/28/16 0426 10/30/16 0644 10/31/16 0500  Weight: 75.4 kg (166 lb 3.2 oz) 74.4 kg (164 lb 1.6 oz) 75.3 kg (166 lb 1.6 oz)    Examination:  General exam: Appears calm and comfortable  Respiratory system: Clear to auscultation. Respiratory effort normal. Cardiovascular system: S1 & S2 heard, RRR. No JVD, murmurs, rubs, gallops or clicks. No pedal edema. Gastrointestinal system: Abdomen is nondistended, soft and nontender. No organomegaly or masses felt. Normal bowel sounds heard. Central nervous system: Alert and oriented. No focal neurological deficits. Extremities: Symmetric 5 x 5 power. Skin: No rashes, lesions or ulcers Psychiatry: Judgement and insight appear normal. Mood & affect appropriate.   Data Reviewed: I have personally reviewed following labs and imaging  studies  CBC:  Recent Labs Lab 10/27/16 0900 10/28/16 0435 10/29/16 0244 10/30/16 0514 10/31/16 0504  WBC 4.7 3.9* 5.4 6.5 6.8  NEUTROABS  --   --  2.3 3.0 3.2  HGB 8.6* 9.6* 9.9* 10.5* 10.4*  HCT 26.0* 29.0* 29.1* 31.7* 32.1*  MCV 88.1 87.6 86.9 88.8 89.4  PLT 192 217 241 285 709   Basic Metabolic Panel:  Recent Labs Lab 10/25/16 0330 10/26/16 0300 10/27/16 0400 10/27/16 0900 10/28/16 0435 10/29/16 0244 10/30/16 0514 10/31/16 0504  NA 136 135 137  --  136 135 137 136  K 4.0 3.1* 4.0  --  4.0 3.5 3.7 3.8  CL 108 105 106  --  102 101 103 101  CO2 19* 20* 21*  --  '24 27 23 24  '$ GLUCOSE 101* 101* 90  --  97 87 86 83  BUN <5* <5* <5*  --  '7 6 6 '$ 6  CREATININE 0.78 0.87 0.81  --  0.79 0.79 0.83 0.87  CALCIUM 8.7* 8.5* 8.8*  --  9.0 8.8* 9.0 9.2  MG 2.1 1.7 1.9  --  2.0  --  1.9  --   PHOS  --  2.7  --  4.9* 4.3  --   --   --    GFR: Estimated Creatinine Clearance: 87.4 mL/min (by C-G formula based on SCr of 0.87 mg/dL). Liver Function Tests:  Recent Labs Lab 10/25/16 0330 10/27/16 0900 10/30/16 0514  AST 137* 69* 28  ALT 253* 143* 67*  ALKPHOS 55 45 52  BILITOT 0.7 0.6 0.6  PROT 6.0* 6.3* 6.3*  ALBUMIN 2.8* 2.8* 3.0*   No results for input(s): LIPASE, AMYLASE in the last 168 hours.  Recent Labs Lab 10/27/16 0900  AMMONIA 11   Coagulation Profile: No results for input(s): INR, PROTIME in the last 168 hours. Cardiac Enzymes: No results for input(s): CKTOTAL, CKMB, CKMBINDEX, TROPONINI in the last 168 hours. BNP (last 3 results) No results for input(s): PROBNP in the last 8760 hours. HbA1C: No results for input(s): HGBA1C in the last 72 hours. CBG:  Recent Labs Lab 10/27/16 2357 10/28/16 0422 10/28/16 0747 10/28/16 1137 10/28/16 1637  GLUCAP 77 94 89 88 89   Lipid Profile: No results for input(s): CHOL, HDL, LDLCALC, TRIG, CHOLHDL, LDLDIRECT in the last 72 hours. Thyroid Function Tests: No results for input(s): TSH, T4TOTAL, FREET4, T3FREE,  THYROIDAB in the last 72 hours. Anemia Panel: No results for input(s): VITAMINB12, FOLATE, FERRITIN, TIBC, IRON, RETICCTPCT in the last 72 hours. Sepsis Labs: No results for input(s): PROCALCITON, LATICACIDVEN in the last 168 hours.  Recent Results (from the past 240 hour(s))  MRSA PCR Screening     Status: None   Collection Time: 10/22/16  1:05 AM  Result Value Ref Range Status   MRSA by PCR NEGATIVE NEGATIVE Final    Comment:        The GeneXpert MRSA Assay (FDA approved for NASAL specimens only), is one component of a comprehensive MRSA colonization surveillance program. It is not intended to diagnose MRSA infection nor to guide or monitor treatment for MRSA infections.   Culture, respiratory (NON-Expectorated)     Status: None   Collection Time: 10/22/16  8:52 AM  Result Value Ref Range Status   Specimen Description TRACHEAL ASPIRATE  Final   Special Requests Normal  Final   Gram Stain   Final    DEGENERATED CELLULAR MATERIAL PRESENT RARE SQUAMOUS EPITHELIAL CELLS PRESENT FEW GRAM POSITIVE COCCI IN PAIRS RARE GRAM POSITIVE RODS RARE GRAM NEGATIVE COCCI IN PAIRS    Culture Consistent with normal respiratory flora.  Final   Report Status 10/24/2016 FINAL  Final  Culture, blood (routine x 2)     Status: None   Collection Time: 10/22/16  9:08 AM  Result Value Ref Range Status   Specimen Description BLOOD A-LINE  Final   Special Requests BOTTLES DRAWN AEROBIC AND ANAEROBIC 5CC  Final   Culture NO GROWTH 5 DAYS  Final   Report Status 10/27/2016 FINAL  Final  Culture, blood (routine x 2)     Status: None   Collection Time: 10/22/16  9:18 AM  Result Value Ref Range Status   Specimen Description BLOOD LEFT ANTECUBITAL  Final   Special Requests IN PEDIATRIC BOTTLE 3.0CC  Final   Culture NO GROWTH 5 DAYS  Final   Report Status 10/27/2016 FINAL  Final  CSF culture     Status:  None (Preliminary result)   Collection Time: 10/28/16  2:00 PM  Result Value Ref Range Status    Specimen Description CSF  Final   Special Requests NONE  Final   Gram Stain   Final    WBC PRESENT, PREDOMINANTLY MONONUCLEAR NO ORGANISMS SEEN CYTOSPIN SMEAR    Culture NO GROWTH 2 DAYS  Final   Report Status PENDING  Incomplete       Radiology Studies: Dg Chest 2 View  Result Date: 10/30/2016 CLINICAL DATA:  Respiratory failure. Pt has been having trouble breathing for 8 days. Smoker of 20+ yrs. Hx of cardiac catheterization 10/21/2016 EXAM: CHEST  2 VIEW COMPARISON:  10/26/2016 FINDINGS: Cardiac silhouette is normal in size. No mediastinal or hilar masses. No evidence of adenopathy. There is opacity at both lung bases, left greater than right, which may all be atelectasis. Pneumonia should be considered if there are consistent clinical symptoms. There is no evidence of pulmonary edema. There is no pleural effusion or pneumothorax. Skeletal structures are unremarkable. IMPRESSION: 1. Left greater than right lung base opacity most likely atelectasis. Pneumonia is possible. No pulmonary edema. Electronically Signed   By: Lajean Manes M.D.   On: 10/30/2016 15:13   US Transvaginal Non-ob  Result Date: 10/29/2016 CLINICAL DATA:  Lower abdominal and pelvic pain. EXAM: TRANSABDOMINAL AND TRANSVAGINAL ULTRASOUND OF PELVIS TECHNIQUE: Both transabdominal and transvaginal ultrasound examinations of the pelvis were performed. Transabdominal technique was performed for global imaging of the pelvis including uterus, ovaries, adnexal regions, and pelvic cul-de-sac. It was necessary to proceed with endovaginal exam following the transabdominal exam to visualize the bilateral adnexa. COMPARISON:  None FINDINGS: Uterus Measurements: 7.4 x 3.1 x 3.8 cm. No fibroids or other mass visualized. Endometrium Thickness: 3 mm.  No focal abnormality visualized. Right ovary Measurements: 3.1 x 2.4 x 2.5 cm. Normal appearance/no adnexal mass. Left ovary Measurements: 2.6 x 1.9 x 1.7 cm. Normal appearance/no adnexal mass.  Other findings Moderate amount of isoechoic free fluid in the pelvis. IMPRESSION: Normal appearance of the uterus and bilateral ovaries. Moderate amount of isoechoic free fluid in the pelvis, which may represent hemorrhagic fluid. Electronically Signed   By: Fidela Salisbury M.D.   On: 10/29/2016 17:11   US Pelvis Complete  Result Date: 10/29/2016 CLINICAL DATA:  Lower abdominal and pelvic pain. EXAM: TRANSABDOMINAL AND TRANSVAGINAL ULTRASOUND OF PELVIS TECHNIQUE: Both transabdominal and transvaginal ultrasound examinations of the pelvis were performed. Transabdominal technique was performed for global imaging of the pelvis including uterus, ovaries, adnexal regions, and pelvic cul-de-sac. It was necessary to proceed with endovaginal exam following the transabdominal exam to visualize the bilateral adnexa. COMPARISON:  None FINDINGS: Uterus Measurements: 7.4 x 3.1 x 3.8 cm. No fibroids or other mass visualized. Endometrium Thickness: 3 mm.  No focal abnormality visualized. Right ovary Measurements: 3.1 x 2.4 x 2.5 cm. Normal appearance/no adnexal mass. Left ovary Measurements: 2.6 x 1.9 x 1.7 cm. Normal appearance/no adnexal mass. Other findings Moderate amount of isoechoic free fluid in the pelvis. IMPRESSION: Normal appearance of the uterus and bilateral ovaries. Moderate amount of isoechoic free fluid in the pelvis, which may represent hemorrhagic fluid. Electronically Signed   By: Fidela Salisbury M.D.   On: 10/29/2016 17:11   Dg Abd Portable 1v  Result Date: 10/29/2016 CLINICAL DATA:  Abdominal pain at umbilical region for 2 days EXAM: PORTABLE ABDOMEN - 1 VIEW COMPARISON:  None FINDINGS: Scattered gas and stool throughout colon to rectum. Small bowel gas pattern normal. No evidence of bowel  dilatation or bowel wall thickening. Osseous structures unremarkable. Scattered pelvic phleboliths without definite urinary tract calcification. IMPRESSION: Nonspecific bowel gas pattern. Electronically  Signed   By: Lavonia Dana M.D.   On: 10/29/2016 13:04      Scheduled Meds: . chlorhexidine gluconate (MEDLINE KIT)  15 mL Mouth Rinse BID  . Influenza vac split quadrivalent PF  0.5 mL Intramuscular Tomorrow-1000  . mouth rinse  15 mL Mouth Rinse BID  . metoprolol tartrate  25 mg Oral TID   Continuous Infusions:   LOS: 9 days    Time spent: 30 minutes   Dessa Phi, DO [facility] [email or web address] Password [facility] 10/31/2016, 8:41 AM

## 2016-10-31 NOTE — Telephone Encounter (Signed)
Call received from Jacqlyn Krauss, RN CM requesting a hospital follow up appointment for the patient.  An appointment was scheduled for 11/08/16 @ 1400 with Dr Leanne Chang. The information was placed on the AVS.

## 2016-10-31 NOTE — Progress Notes (Signed)
Physical Therapy Treatment Patient Details Name: [name] MRN: [id number] DOB: 01-25-1981 Today's Date: 10/31/2016    History of Present Illness Rachel Duncan is a 35 year old female with history of alcohol abuse who was brought in to [facility] ED via EMS after cardiac arrest at home. Apparently, patient was looking at her phone after finishing dinner when she suddenly fell, arched back appearing stiff. Upon EMS arrival, patient was found to be in V. fib. She received total of 6 shocks and one round of epinephrine and achieved return of spontaneous circulation. Patient was intubated, taken to cath Lab where she was found to have no significant coronary artery disease. She also had seizures when she arrived in the ICU. Pt was intubated, extubated and reintubated on 12/5, self extubated on 12/7    PT Comments    Patient progressing with tolerance to activity on RA.  Feel currently able to compensate without home O2 which is her preference, but would qualify if needed based on this session as O2 sats did drop to 80% after stair negotiation.  Feel continued HHPT indicated as she continues to drift when ambulating without AD and does have continued decreased activity tolerance.    Follow Up Recommendations  Home health PT;Supervision for mobility/OOB     Equipment Recommendations  None recommended by PT    Recommendations for Other Services       Precautions / Restrictions Precautions Precautions: None    Mobility  Bed Mobility Overal bed mobility: Modified Independent             General bed mobility comments: increased time out and into bed  Transfers Overall transfer level: Needs assistance Equipment used: None Transfers: Sit to/from Stand Sit to Stand: Modified independent (Device/Increase time)         General transfer comment: assist for telemetry  Ambulation/Gait Ambulation/Gait assistance: Supervision Ambulation Distance (Feet): 300 Feet Assistive device:  None Gait Pattern/deviations: Step-through pattern;Drifts right/left     General Gait Details: no LOB, but does drift R&L but did not run into items in wide open hallway.  SpO2 remained in 90's on RA, except after stairs then back to room dropped briefly to 80%, then back up to 90+ in less than 30 sec; RR max 31, denies SOB, however, HR max 118   Stairs Stairs: Yes   Stair Management: One rail Left;Alternating pattern;Forwards Number of Stairs: 3 General stair comments: assist for safety, no LOB, safe technique  Wheelchair Mobility    Modified Rankin (Stroke Patients Only)       Balance Overall balance assessment: Needs assistance   Sitting balance-Leahy Scale: Good       Standing balance-Leahy Scale: Good                      Cognition Arousal/Alertness: Awake/alert Behavior During Therapy: Flat affect Overall Cognitive Status: Within Functional Limits for tasks assessed                      Exercises      General Comments General comments (skin integrity, edema, etc.): Educated in energy conservation tips with daily activities like sitting to rest when fatigued, use of AE and planning out acitivities to prevent fatigue.       Pertinent Vitals/Pain Pain Assessment: No/denies pain    Home Living                      Prior Function  PT Goals (current goals can now be found in the care plan section) Progress towards PT goals: Progressing toward goals    Frequency    Min 3X/week      PT Plan Current plan remains appropriate;Equipment recommendations need to be updated    Co-evaluation             End of Session Equipment Utilized During Treatment: Gait belt Activity Tolerance: Patient limited by lethargy Patient left: in bed;with call bell/phone within reach     Time: 1450-1517 PT Time Calculation (min) (ACUTE ONLY): 27 min  Charges:  $Gait Training: 8-22 mins $Self Care/Home Management: 8-22                     G Codes:      Reginia Naas November 06, 2016, 3:40 PM  Magda Kiel, [place] 11-06-2016

## 2016-10-31 NOTE — Progress Notes (Addendum)
Occupational Therapy Treatment Patient Details Name: Rachel Duncan MRN: [id number] DOB: 10-25-1981 Today's Date: 10/31/2016    History of present illness Rachel Duncan is a 35 year old female with history of alcohol abuse who was brought in to [facility] ED via EMS after cardiac arrest at home. Apparently, patient was looking at her phone after finishing dinner when she suddenly fell, arched back appearing stiff. Per family pt was probably down 45 minutes until they were able to get her stable enough to bring to hospital.Upon EMS arrival, patient was found to be in V. fib. She received total of 6 shocks and one round of epinephrine and achieved return of spontaneous circulation. Patient was intubated, taken to cath Lab where she was found to have no significant coronary artery disease. She also had seizures when she arrived in the ICU. Pt was intubated, extubated and reintubated on 12/5, self extubated on 12/7   OT comments  This 35 yo female admitted with above presents to acute OT with a big issue right now of decrease sats with ambulation. Pt will benefit from continued OT to address energy conservation measures and higher level cognitive tasks. Based off of how pt is moving about her room and hallway pt has met her S level ADL goals. Two new goals added.  Follow Up Recommendations  Home health OT    Equipment Recommendations  Tub/shower seat       Precautions / Restrictions Precautions Precautions: None Restrictions Weight Bearing Restrictions: No       Mobility Bed Mobility Overal bed mobility: Modified Independent             General bed mobility comments: In OOB without A just increased time  Transfers Overall transfer level: Needs assistance Equipment used: None Transfers: Sit to/from Stand Sit to Stand: Supervision         General transfer comment: Ambulated in hallways with minguard A without RW--200 feet        ADL                            Toilet Transfer: Supervision/safety;Stand-pivot;BSC   Toileting- Clothing Manipulation and Hygiene: Supervision/safety;Sit to/from stand         General ADL Comments: Pt up getting washed up after just having been on 3n1 close by when I entered. Acquired and educated pt on use of inspirometer (she did this 15 times with me, focusing on waiting between breaths and not doing them to quickly --wait 30 seconds between each breath and do 10 repetitions every waking hour). Pt did exhibit some coughing while doing this. 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 Pt reports today that this is normal for her. Pt was able to tell me what the 2 food items were that we talked about yesterday making from a box. She was able to explain to her family about what her oxygen is doing. Pt also able  to tell me what she would do in a fire situation at home as well as coming up upon an accident.                 Pertinent Vitals/ Pain       Pain Assessment: No/denies pain         Frequency  Min 3X/week        Progress Toward Goals  OT Goals(current goals can now be found in the care plan section)  Progress towards OT goals: Progressing toward goals     Plan Discharge plan remains appropriate       End of Session Equipment Utilized During Treatment: Oxygen;Gait belt   Activity Tolerance Patient tolerated treatment well (sats did drop on RA)   Patient Left in bed;with call bell/phone within reach;with family/visitor present   Nurse Communication  (O2 status)         Time: 1017-1100 OT Time Calculation (min): 43 min  Charges: OT Treatments $Self Care/Home Management : 38-52 mins  Rachel Duncan [phone] 10/31/2016, 11:43 AM

## 2016-10-31 NOTE — Progress Notes (Signed)
  Echocardiogram 2D Echocardiogram has been performed.  Bobbye Charleston 10/31/2016, 2:53 PM

## 2016-10-31 NOTE — Plan of Care (Signed)
Problem: Education: Goal: Knowledge of [facility] Education information/materials will improve Outcome: Completed/Met Date Met: 10/31/16 Pt educated throughout entire admission regarding tests, procedures, labs, medications, and available resources.   Problem: Safety: Goal: Ability to remain free from injury will improve Outcome: Completed/Met Date Met: 10/31/16 Pt has remained free from injury during this admission   Problem: Physical Regulation: Goal: Will remain free from infection Outcome: Completed/Met Date Met: 10/31/16 Pt has remained free from infection during this admission   Problem: Fluid Volume: Goal: Ability to maintain a balanced intake and output will improve Outcome: Completed/Met Date Met: 10/31/16 Pt has adequate intake and output

## 2016-10-31 NOTE — Progress Notes (Signed)
SATURATION QUALIFICATIONS: (This note is used to comply with regulatory documentation for home oxygen)  Patient Saturations on Room Air at Rest = 92%  Patient Saturations on Room Air while Ambulating = 80%  Patient Saturations on 2 Liters of oxygen while Ambulating = 95%  Please briefly explain why patient needs home oxygen:

## 2016-10-31 NOTE — Care Management Important Message (Signed)
Important Message  Patient Details  Name: Rachel Duncan MRN: [id number] Date of Birth: 06/07/1981   Medicare Important Message Given:  Yes    Nathen May 10/31/2016, 10:31 AM

## 2016-11-01 LAB — BASIC METABOLIC PANEL WITH GFR
Anion gap: 9 (ref 5–15)
BUN: 8 mg/dL (ref 6–20)
CO2: 24 mmol/L (ref 22–32)
Calcium: 9.1 mg/dL (ref 8.9–10.3)
Chloride: 104 mmol/L (ref 101–111)
Creatinine, Ser: 0.77 mg/dL (ref 0.44–1.00)
GFR calc Af Amer: >60 mL/min
GFR calc non Af Amer: >60 mL/min
Glucose, Bld: 100 mg/dL — ABNORMAL HIGH (ref 65–99)
Potassium: 3.8 mmol/L (ref 3.5–5.1)
Sodium: 137 mmol/L (ref 135–145)

## 2016-11-01 LAB — CBC WITH DIFFERENTIAL/PLATELET
Basophils Absolute: 0 K/uL (ref 0.0–0.1)
Basophils Relative: 0 %
Eosinophils Absolute: 0.1 K/uL (ref 0.0–0.7)
Eosinophils Relative: 1 %
HCT: 31.7 % — ABNORMAL LOW (ref 36.0–46.0)
Hemoglobin: 10.4 g/dL — ABNORMAL LOW (ref 12.0–15.0)
Lymphocytes Relative: 41 %
Lymphs Abs: 2.6 K/uL (ref 0.7–4.0)
MCH: 29.2 pg (ref 26.0–34.0)
MCHC: 32.8 g/dL (ref 30.0–36.0)
MCV: 89 fL (ref 78.0–100.0)
Monocytes Absolute: 0.4 K/uL (ref 0.1–1.0)
Monocytes Relative: 7 %
Neutro Abs: 3.3 K/uL (ref 1.7–7.7)
Neutrophils Relative %: 51 %
Platelets: 313 K/uL (ref 150–400)
RBC: 3.56 MIL/uL — ABNORMAL LOW (ref 3.87–5.11)
RDW: 13.9 % (ref 11.5–15.5)
WBC: 6.4 K/uL (ref 4.0–10.5)

## 2016-11-01 LAB — CSF CULTURE: CULTURE: NO GROWTH

## 2016-11-01 LAB — GLUCOSE, CAPILLARY
Glucose-Capillary: 113 mg/dL — ABNORMAL HIGH (ref 65–99)
Glucose-Capillary: 97 mg/dL (ref 65–99)
Glucose-Capillary: 88 mg/dL (ref 65–99)

## 2016-11-01 LAB — CSF CULTURE W GRAM STAIN

## 2016-11-01 MED ORDER — METOPROLOL TARTRATE 25 MG PO TABS: 25.0000 mg | ORAL_TABLET | Freq: Three times a day (TID) | ORAL | 1 refills | Status: DC

## 2016-11-01 NOTE — Progress Notes (Signed)
SATURATION QUALIFICATIONS: (This note is used to comply with regulatory documentation for home oxygen)  Patient Saturations on Room Air at Rest = 93%  Patient Saturations on Room Air while Ambulating = 86%  Patient Saturations on 2 Liters of oxygen while Ambulating = 94%  Please briefly explain why patient needs home oxygen: 

## 2016-11-01 NOTE — Discharge Instructions (Signed)
Follow with [facility] as scheduled  Please get a complete blood count and chemistry panel checked by your Primary MD at your next visit, and again as instructed by your Primary MD. Please get your medications reviewed and adjusted by your Primary MD.  Please request your Primary MD to go over all Hospital Tests and Procedure/Radiological results at the follow up, please get all Hospital records sent to your Prim MD by signing hospital release before you go home.  If you had Pneumonia of Lung problems at the Hospital: Please get a 2 view Chest X ray done in 6-8 weeks after hospital discharge or sooner if instructed by your Primary MD.  If you have Congestive Heart Failure: Please call your Cardiologist or Primary MD anytime you have any of the following symptoms:  1) 3 pound weight gain in 24 hours or 5 pounds in 1 week  2) shortness of breath, with or without a dry hacking cough  3) swelling in the hands, feet or stomach  4) if you have to sleep on extra pillows at night in order to breathe  Follow cardiac low salt diet and 1.5 lit/day fluid restriction.  If you have diabetes Accuchecks 4 times/day, Once in AM empty stomach and then before each meal. Log in all results and show them to your primary doctor at your next visit. If any glucose reading is under 80 or above 300 call your primary MD immediately.  If you have Seizure/Convulsions/Epilepsy: Please do not drive, operate heavy machinery, participate in activities at heights or participate in high speed sports until you have seen by Primary MD or a Neurologist and advised to do so again.  If you had Gastrointestinal Bleeding: Please ask your Primary MD to check a complete blood count within one week of discharge or at your next visit. Your endoscopic/colonoscopic biopsies that are pending at the time of discharge, will also need to followed by your Primary MD.  Get Medicines reviewed and adjusted. Please  take all your medications with you for your next visit with your Primary MD  Please request your Primary MD to go over all hospital tests and procedure/radiological results at the follow up, please ask your Primary MD to get all Hospital records sent to his/her office.  If you experience worsening of your admission symptoms, develop shortness of breath, life threatening emergency, suicidal or homicidal thoughts you must seek medical attention immediately by calling 911 or calling your MD immediately  if symptoms less severe.  You must read complete instructions/literature along with all the possible adverse reactions/side effects for all the Medicines you take and that have been prescribed to you. Take any new Medicines after you have completely understood and accpet all the possible adverse reactions/side effects.   Do not drive or operate heavy machinery when taking Pain medications.   Do not take more than prescribed Pain, Sleep and Anxiety Medications  Special Instructions: If you have smoked or chewed Tobacco  in the last 2 yrs please stop smoking, stop any regular Alcohol  and or any Recreational drug use.  Wear Seat belts while driving.  Please note You were cared for by a hospitalist during your hospital stay. If you have any questions about your discharge medications or the care you received while you were in the hospital after you are discharged, you can call the unit and asked to speak with the hospitalist on call if the hospitalist that took care of you is not available.  Once you are discharged, your primary care physician will handle any further medical issues. Please note that NO REFILLS for any discharge medications will be authorized once you are discharged, as it is imperative that you return to your primary care physician (or establish a relationship with a primary care physician if you do not have one) for your aftercare needs so that they can reassess your need for medications  and monitor your lab values.  You can reach the hospitalist office at phone [phone] or fax [phone]   If you do not have a primary care physician, you can call [phone] for a physician referral.  Activity: As tolerated with Full fall precautions use walker/cane & assistance as needed  Diet: heart healthy  Disposition Home

## 2016-11-01 NOTE — Discharge Summary (Signed)
Physician Discharge Summary  Rusty Manser [id number] DOB: [birth year] DOA: 10/21/2016  PCP: No primary care provider on file.  Admit date: 10/21/2016 Discharge date: 11/01/2016  Admitted From: home Disposition:  home  Recommendations for Outpatient Follow-up:  1. Follow up with Dr. Leanne Chang as scheduled in 1 week 2. Follow up with Dr. Einar Gip in 2-3 weeks  Home Health: PT, OT, RN Equipment/Devices: walker, oxygen  Discharge Condition: stable CODE STATUS: Full  Diet recommendation: heart healthy  HPI: [name] is a 35 y/o woman with history of previous alcohol abuse who was brought to [facility] via EMS after a cardiac arrest at home.  Her mother reports that Ms. Pross was sitting and looking at her phone after finishing dinner.  She suddenly fell and arched back appearing stiff.  The family called 37 and CPR was started by her brother until EMS arrived.  When EMS arrived she was found to be in V-fib.  She received a total of 6 shocks and 1 round of epinephrine before return of spontaneous circulation.  In the ED it was noted that her oropharynx was full of debris. Per ED report she was not following commands prior to intubation.  From the ED she was taken to the cath lab where she was found to have no significant coronary artery disease and a preserved EF.  She was then transferred to the ICU.  Upon my arrival to the ICU she appeared to be actively seizing.  She was given a total of 8mg  ativan with resolution of seizure activity.   Hospital Course: Discharge Diagnoses:  Principal Problem:   Cardiac arrest [facility]) Active Problems:   Acute respiratory failure with hypoxia ([place])   Acute encephalopathy   Anoxic encephalopathy (HCC)   Seizure (HCC)   Hypoxemia   V. fib arrest - patient admitted to the hospital on 12/2 with cardiac arrest, and was admitted to ICU. She underwent  heart cath on 12/2 with normal coronary arteries. She received cooling protocol while in ICU. She recovered  well, telemetry showed intermittent ventricular bigeminy, occasional trigeminy on telemetry, metoprolol dose increased to TID, stable, follow up with cardiology as an outpatient. LifeVest prior to DC. Most recent 2D echo on 12/12 showed EF 40-45%/ Seizure - EEG 12/4: EEG is markedly abnormal due to diffuse background suppression and lack of EEG reactivity. There was no epileptiform activity on this recording, but this does not rule out seizure disorder. MRI brain was unremarkable. She underwent LP on 12/9: WBC 2, protein 15, glucose 60. Culture pending, no growth to date. Neurology consulted and have followed patient while hospitalized. No further work up. No long-term antiepileptics without seizure hx or clear predisposition. Per [facility] statutes, patients with seizures are not allowed to drive until they have been seizure-free for six months.Use caution when using heavy equipment or power tools. Avoid working on ladders or at heights. Take showers instead of baths. Ensure the water temperature is not too high on the home water heater. Do not go swimming alone. When caring for infants or small children, sit down when holding, feeding, or changing them to minimize risk of injury to the child in the event you have a seizure.   Lower abdominal pain -AXR and pelvic US unrevealing, +some pelvic fluid. Improved.  Bilateral leg heaviness/tremor -Post-LP, now resolved. MRI lumbar unremarkable Acute encephalopathy -Likely secondary to prolonged arrest. Resolved, back to baseline.  Acute hypoxemic respiratory failure -Secondary to inability to protect airway, aspiration, Intubated on admission, extubated 12/5,  re-intubated 12/5, and self extubated 12/7. She eventually improved, however still needing 2 liters nasal cannula on discharge. Pyuria - Leukocytes and nitrite in Ua, but only 6-30 WBC. Urine culture not available. Received 7 day course rocephin.  Elevated liver enzymes - Likely due to arrest,  shock liver. Improving with supportive care  History of alcohol abuse - Last alcoholic drink was Thanksgiving. Counseled for complete cessation  Discharge Instructions    Medication List    TAKE these medications   medroxyPROGESTERone 150 MG/ML injection Commonly known as:  DEPO-PROVERA Inject 150 mg into the muscle every 3 (three) months.   metoprolol tartrate 25 MG tablet Commonly known as:  LOPRESSOR Take 1 tablet (25 mg total) by mouth 3 (three) times daily.            Durable Medical Equipment        Start     Ordered   11/01/16 1206  For home use only DME oxygen  Once    Comments:  Patient needs O2 due to walk test  Question Answer Comment  Mode or (Route) Nasal cannula   Liters per Minute 2   Frequency Continuous (stationary and portable oxygen unit needed)   Oxygen conserving device Yes   Oxygen delivery system Gas      11/01/16 1206   10/31/16 1632  For home use only DME Walker rolling  Once    Question:  Patient needs a walker to treat with the following condition  Answer:  Mobility impaired   10/31/16 1633     Follow-up Information    [facility] Follow up.   Specialty:  Home Health Services Why:  Physical Therapy, Occupational Therapy, Registered Nurse.  Contact information: [address] [address] [place] [address] [phone]        [facility] Follow up.   Why:  Oxygen, Rolling Walker Contact information: [place] [address] [phone]        [place]. Go on 11/08/2016.   Why:  at 2:00pm for a hospital follow up appointment with Dr Carolin Sicks information: [place] [phone]       Adrian Prows, MD. Go on 11/15/2016.   Specialty:  Cardiology Why:  Hospital follow up @ 2 be there by 130 bring id, all medications and copay if it applies Contact information: [address] [place] [address] [phone]          No Known Allergies  Consultations:  Cardiology   Procedures/Studies:  Cardiac catheterization 12/3 1. Normal coronary arteries. 2. Left ventricular systolic function appears to be preserved, EF 50%. Normal LVEDP.   2D echo 12/03 Study Conclusions - Left ventricle: The cavity size was normal. Systolic function was severely reduced. The estimated ejection fraction was in the range of 25% to 30%. Diffuse hypokinesis. Left ventricular diastolic function parameters were normal. - Mitral valve: There was mild regurgitation. - Right ventricle: Systolic function was moderately reduced. - Atrial septum: No defect or patent foramen ovale was identified. - Tricuspid valve: There was mild-moderate regurgitation. Peak RV-RA gradient (S): 22 mm Hg.   2D echo 12/13 Study Conclusions - Left ventricle: The cavity size was normal. There was mild concentric hypertrophy. Systolic function was mildly to moderately reduced. The estimated ejection fraction was in the range of 40% to 45%. Diffuse hypokinesis. Left ventricular diastolic function parameters were normal. - Mitral  valve: There was mild regurgitation. - Pericardium, extracardiac: A trivial pericardial effusion was identified.    Dg Chest 2 View  Result Date: 10/30/2016 CLINICAL DATA:  Respiratory failure. Pt has been having trouble breathing for 8 days. Smoker of 20+ yrs. Hx of cardiac catheterization 10/21/2016 EXAM: CHEST  2 VIEW COMPARISON:  10/26/2016 FINDINGS: Cardiac silhouette is normal in size. No mediastinal or hilar masses. No evidence of adenopathy. There is opacity at both lung bases, left greater than right, which may all be atelectasis. Pneumonia should be considered if there are consistent clinical symptoms. There is no evidence of pulmonary edema. There is no pleural effusion or pneumothorax. Skeletal structures are unremarkable. IMPRESSION: 1. Left greater than right lung  base opacity most likely atelectasis. Pneumonia is possible. No pulmonary edema. Electronically Signed   By: Lajean Manes M.D.   On: 10/30/2016 15:13   Ct Head Wo Contrast  Result Date: 10/22/2016 CLINICAL DATA:  Post CPR evaluate for bleed EXAM: CT HEAD WITHOUT CONTRAST TECHNIQUE: Contiguous axial images were obtained from the base of the skull through the vertex without intravenous contrast. COMPARISON:  None. FINDINGS: Brain: No evidence of acute infarction, hemorrhage, hydrocephalus, extra-axial collection or mass lesion/mass effect. Vascular: No hyperdense vessel or unexpected calcification. Skull: Normal. Negative for fracture or focal lesion. Sinuses/Orbits: Mucosal thickening in the sphenoid and ethmoid sinuses. No acute orbital abnormality. Other: None IMPRESSION: No CT evidence for acute intracranial abnormality. Electronically Signed   By: Donavan Foil M.D.   On: 10/22/2016 00:15   Mr Brain Wo Contrast  Result Date: 10/27/2016 CLINICAL DATA:  Seizure EXAM: MRI HEAD WITHOUT CONTRAST TECHNIQUE: Multiplanar, multiecho pulse sequences of the brain and surrounding structures were obtained without intravenous contrast. COMPARISON:  CT head 10/22/2016 FINDINGS: Brain: Ventricle size and cerebral volume normal. Negative for acute or chronic infarction. White matter normal. Brainstem and cerebellum normal. No intracranial hemorrhage or mass. No shift of the midline structures. No structural abnormality. Vascular: Normal arterial flow voids. Skull and upper cervical spine: Negative Sinuses/Orbits: Mucosal edema throughout the paranasal sinuses. Extensive mucosal edema and air-fluid level in the sphenoid sinus. Mild mastoid sinus effusion bilaterally. Other: None IMPRESSION: Normal unenhanced MRI of the brain Extensive sinus mucosal disease most notably in the sphenoid sinus. Air-fluid level in the sphenoid sinus. Electronically Signed   By: Franchot Gallo M.D.   On: 10/27/2016 11:23   Mr Lumbar Spine Wo  Contrast  Result Date: 10/29/2016 CLINICAL DATA:  Initial evaluation for new onset seizure and setting of cardiac arrest. Bilateral leg heaviness. Recent lumbar puncture. EXAM: MRI LUMBAR SPINE WITHOUT CONTRAST TECHNIQUE: Multiplanar, multisequence MR imaging of the lumbar spine was performed. No intravenous contrast was administered. COMPARISON:  Prior radiographs from 10/28/2016 related a lumbar puncture. FINDINGS: Segmentation: Transitional lumbosacral anatomy with sacralization of the L5 vertebral body. L5-S1 disc is somewhat rudimentary. Alignment: Vertebral bodies normally aligned with preservation of the normal lumbar lordosis. No listhesis. Vertebrae: Vertebral body heights are well maintained. No evidence for acute or chronic fracture. Signal intensity within the vertebral body bone marrow within normal limits. No focal osseous lesions. No abnormal marrow edema. Conus medullaris: Extends to the L1 level and appears normal. No epidural collection or other abnormality identified status post recent lumbar puncture. Paraspinal and other soft tissues: Mild fluid signal intensity within the left paraspinous musculature at the L2-3 levels, suspected to be related to recent lumbar puncture (series 6, image 13). Mild subcutaneous edema. Paraspinous soft tissues are otherwise within normal limits. Moderate distension  noted of the partially visualized urinary bladder. Remainder of the visualized visceral structures are within normal limits. Disc levels: No significant degenerative changes are identified within the lumbar spine. No disc bulge or disc protrusion. Intervertebral discs are well hydrated. No significant facet arthropathy. No canal or foraminal stenosis. IMPRESSION: 1. Essentially normal MRI of the lumbar spine. No acute abnormality identified to explain patient's symptoms. 2. Mild induration within the left posterior paraspinous musculature, likely related to recent lumbar puncture. No complication  identified status post recent intervention. 3. Moderate distention of the partially visualized urinary bladder. 4. Transitional lumbosacral anatomy with sacralization of the L5 vertebral body. Electronically Signed   By: Jeannine Boga M.D.   On: 10/29/2016 01:45   US Transvaginal Non-ob  Result Date: 10/29/2016 CLINICAL DATA:  Lower abdominal and pelvic pain. EXAM: TRANSABDOMINAL AND TRANSVAGINAL ULTRASOUND OF PELVIS TECHNIQUE: Both transabdominal and transvaginal ultrasound examinations of the pelvis were performed. Transabdominal technique was performed for global imaging of the pelvis including uterus, ovaries, adnexal regions, and pelvic cul-de-sac. It was necessary to proceed with endovaginal exam following the transabdominal exam to visualize the bilateral adnexa. COMPARISON:  None FINDINGS: Uterus Measurements: 7.4 x 3.1 x 3.8 cm. No fibroids or other mass visualized. Endometrium Thickness: 3 mm.  No focal abnormality visualized. Right ovary Measurements: 3.1 x 2.4 x 2.5 cm. Normal appearance/no adnexal mass. Left ovary Measurements: 2.6 x 1.9 x 1.7 cm. Normal appearance/no adnexal mass. Other findings Moderate amount of isoechoic free fluid in the pelvis. IMPRESSION: Normal appearance of the uterus and bilateral ovaries. Moderate amount of isoechoic free fluid in the pelvis, which may represent hemorrhagic fluid. Electronically Signed   By: Fidela Salisbury M.D.   On: 10/29/2016 17:11   US Pelvis Complete  Result Date: 10/29/2016 CLINICAL DATA:  Lower abdominal and pelvic pain. EXAM: TRANSABDOMINAL AND TRANSVAGINAL ULTRASOUND OF PELVIS TECHNIQUE: Both transabdominal and transvaginal ultrasound examinations of the pelvis were performed. Transabdominal technique was performed for global imaging of the pelvis including uterus, ovaries, adnexal regions, and pelvic cul-de-sac. It was necessary to proceed with endovaginal exam following the transabdominal exam to visualize the bilateral adnexa.  COMPARISON:  None FINDINGS: Uterus Measurements: 7.4 x 3.1 x 3.8 cm. No fibroids or other mass visualized. Endometrium Thickness: 3 mm.  No focal abnormality visualized. Right ovary Measurements: 3.1 x 2.4 x 2.5 cm. Normal appearance/no adnexal mass. Left ovary Measurements: 2.6 x 1.9 x 1.7 cm. Normal appearance/no adnexal mass. Other findings Moderate amount of isoechoic free fluid in the pelvis. IMPRESSION: Normal appearance of the uterus and bilateral ovaries. Moderate amount of isoechoic free fluid in the pelvis, which may represent hemorrhagic fluid. Electronically Signed   By: Fidela Salisbury M.D.   On: 10/29/2016 17:11   Dg Chest Port 1 View  Result Date: 10/26/2016 CLINICAL DATA:  Intubation. EXAM: PORTABLE CHEST 1 VIEW COMPARISON:  10/26/2016. FINDINGS: Endotracheal tube, NG tube, right subclavian line in good anatomic position. Cardiomegaly with normal pulmonary vascularity. Low lung volumes with partial clearing of bibasilar atelectasis. No pleural effusion or pneumothorax . IMPRESSION: 1. Lines and tubes in stable position. 2.  Low lung volumes with partial clearing of bibasilar atelectasis. Electronically Signed   By: Marcello Moores  Register   On: 10/26/2016 13:52   Dg Chest Port 1 View  Result Date: 10/26/2016 CLINICAL DATA:  Vessel lead ir depending, shortness of breath portable chest x-ray of 10/24/2016 EXAM: PORTABLE CHEST 1 VIEW COMPARISON:  None. FINDINGS: The tip of the endotracheal is very near the carina,  only 7 mm above. Bibasilar opacities remain left-greater-than-right most consistent with atelectasis. Pneumonia at the left lung base cannot be excluded although the left base is obscured by overlying pad. Right central venous line tip overlies the lower SVC. Cardiomegaly is stable. IMPRESSION: 1. Tip of endotracheal very near the carina, only a 7 mm above. 2. Bibasilar opacities left-greater-than-right most consistent with atelectasis. The left lung base is difficult to assess however.  3. Stable cardiomegaly. Electronically Signed   By: Ivar Drape M.D.   On: 10/26/2016 07:58   Dg Chest Port 1 View  Result Date: 10/24/2016 CLINICAL DATA:  Acute respiratory failure. EXAM: PORTABLE CHEST 1 VIEW COMPARISON:  0510 hours on the same day FINDINGS: The tip of an endotracheal tube is approximately 3.1 cm above the carina in satisfactory position. Right subclavian central line tip is noted at the cavoatrial junction. Lung volumes are low with atelectasis and/or effusions at the lung bases. There is no pneumothorax. Gastric tube tip is seen in expected location of the stomach. External defibrillator paddles project over the left heart border and left lung base. IMPRESSION: Low lung volumes. Satisfactory support line and tube positions. Bibasilar atelectasis and/or effusions are suggested on current exam at each base. Electronically Signed   By: Ashley Royalty M.D.   On: 10/24/2016 20:13   Dg Chest Port 1 View  Result Date: 10/24/2016 CLINICAL DATA:  Acute respiratory failure EXAM: PORTABLE CHEST 1 VIEW COMPARISON:  10/23/2016 FINDINGS: Cardiac shadow remains enlarged. An endotracheal tube, a right-sided central venous catheter and nasogastric catheter are noted in satisfactory position. The lungs are well aerated bilaterally. No infiltrate or pneumothorax is noted. No acute bony abnormality is seen. IMPRESSION: No acute abnormality noted. Tubes and lines as described. Electronically Signed   By: Inez Catalina M.D.   On: 10/24/2016 07:45   Dg Chest Port 1 View  Result Date: 10/23/2016 CLINICAL DATA:  Respiratory failure. EXAM: PORTABLE CHEST 1 VIEW COMPARISON:  10/22/2016 . FINDINGS: Endotracheal tube and NG tube in stable position. Right subclavian line at cavoatrial junction. Cardiomegaly. Low lung volumes with basilar atelectasis. Mild pulmonary interstitial prominence. Mild interstitial edema cannot be completely excluded. No pleural effusion or pneumothorax . IMPRESSION: 1. Right subclavian line  tip at cavoatrial junction. Endotracheal tube and NG tube in stable position. 2. Low lung volume with basilar atelectasis. 3. Mild interstitial prominence noted. Mild interstitial edema cannot be exclude completely excluded. Electronically Signed   By: Marcello Moores  Register   On: 10/23/2016 07:14   Dg Chest Port 1 View  Result Date: 10/22/2016 CLINICAL DATA:  Central line and nasogastric tube placement. Initial encounter. EXAM: PORTABLE CHEST 1 VIEW COMPARISON:  Chest radiograph performed 10/21/2016 FINDINGS: The patient's endotracheal tube is seen ending 3 cm above the carina. A right subclavian line is noted ending overlying the right atrium. This could be retracted 2-3 cm. An enteric tube is noted coiling within the stomach. The lungs are mildly hypoexpanded but appear grossly clear. No pleural effusion or pneumothorax is seen. The cardiomediastinal silhouette is mildly enlarged. No acute osseous abnormalities are identified. External pacing pads are noted. IMPRESSION: 1. Endotracheal tube seen ending 3 cm above the carina. 2. Right subclavian line noted ending overlying the right atrium. This could be retracted 2-3 cm. 3. Lungs mildly hypoexpanded but grossly clear. 4. Mild cardiomegaly. These results were called by telephone at the time of interpretation on 10/22/2016 at 2:49 am to [place] [facility], who verbally acknowledged these results. Electronically Signed  By: Garald Balding M.D.   On: 10/22/2016 02:51   Dg Chest Portable 1 View  Result Date: 10/21/2016 CLINICAL DATA:  Intubated EXAM: PORTABLE CHEST 1 VIEW COMPARISON:  10/21/2016 FINDINGS: Endotracheal tube tip has been repositioned and is now proximally 2 cm superior to the carina. Esophageal tube tip is below the diaphragm but is not included. There is persistent cardiomegaly. There is no significant interval change in ground-glass density throughout the left thorax and within the right upper lobe. No pneumothorax. IMPRESSION: 1. Endotracheal tube  tip is at the lower margin of the clavicles 2. Cardiomegaly 3. No change in diffuse ground-glass density of the left thorax and within the right upper lobe. Electronically Signed   By: Donavan Foil M.D.   On: 10/21/2016 23:45   Dg Chest Portable 1 View  Result Date: 10/21/2016 CLINICAL DATA:  ETT placement EXAM: PORTABLE CHEST 1 VIEW COMPARISON:  None. FINDINGS: Endotracheal tube tip encroaches right main stem bronchus orifice. Esophageal tube tip is below the diaphragm but is not included on the image. Pacer patch is obscure the lower chest. There is cardiomegaly. There is diffuse ground-glass density throughout the left hemi thorax. There is no pneumothorax. IMPRESSION: 1. Endotracheal tube tip in crit she is right mainstem bronchus orifice 2. Cardiomegaly 3. Diffuse ground-glass density in the left thorax could relate to edema, hemorrhage, or pneumonia. Mild similar appearance of ground-glass density in the right upper lobe. Electronically Signed   By: Donavan Foil M.D.   On: 10/21/2016 23:44   Dg Abd Portable 1v  Result Date: 10/29/2016 CLINICAL DATA:  Abdominal pain at umbilical region for 2 days EXAM: PORTABLE ABDOMEN - 1 VIEW COMPARISON:  None FINDINGS: Scattered gas and stool throughout colon to rectum. Small bowel gas pattern normal. No evidence of bowel dilatation or bowel wall thickening. Osseous structures unremarkable. Scattered pelvic phleboliths without definite urinary tract calcification. IMPRESSION: Nonspecific bowel gas pattern. Electronically Signed   By: Lavonia Dana M.D.   On: 10/29/2016 13:04   Dg Fluoro Guide Lumbar Puncture  Result Date: 10/28/2016 CLINICAL DATA:  Patient with history of seizure. EXAM: DIAGNOSTIC LUMBAR PUNCTURE UNDER FLUOROSCOPIC GUIDANCE FLUOROSCOPY TIME:  Fluoroscopy Time:  36 seconds PROCEDURE: Informed consent was obtained from the patient prior to the procedure, including potential complications of headache, allergy, and pain. With the patient prone, the  lower back was prepped with Betadine. 1% Lidocaine was used for local anesthesia. Lumbar puncture was performed at the L3-4 level using a 20 gauge needle with return of clear CSF. No opening pressure was obtained due to patient's inability to move. 8 ml of CSF were obtained for laboratory studies. The patient tolerated the procedure well and there were no apparent complications. IMPRESSION: Technically successful lumbar puncture. Fluid sent to laboratory for analysis. Electronically Signed   By: Lovey Newcomer M.D.   On: 10/28/2016 14:40      Subjective: - no chest pain, shortness of breath, no abdominal pain, nausea or vomiting.   Discharge Exam: Vitals:   10/31/16 2157 11/01/16 0500  BP: 120/78 (!) 152/85  Pulse: 75 62  Resp:  18  Temp:  98.9 F (37.2 C)   Vitals:   10/31/16 1538 10/31/16 2051 10/31/16 2157 11/01/16 0500  BP: 137/76 115/63 120/78 (!) 152/85  Pulse: 75 80 75 62  Resp: 18 18  18   Temp: 98.4 F (36.9 C) 99.2 F (37.3 C)  98.9 F (37.2 C)  TempSrc: Oral Oral  Oral  SpO2: 94% 95%  96%  Weight:    75 kg (165 lb 4.8 oz)  Height:        General: Pt is alert, awake, not in acute distress Cardiovascular: RRR, S1/S2 +, no rubs, no gallops Respiratory: CTA bilaterally, no wheezing, no rhonchi Abdominal: Soft, NT, ND, bowel sounds + Extremities: no edema, no cyanosis    The results of significant diagnostics from this hospitalization (including imaging, microbiology, ancillary and laboratory) are listed below for reference.     Microbiology: Recent Results (from the past 240 hour(s))  CSF culture     Status: None (Preliminary result)   Collection Time: 10/28/16  2:00 PM  Result Value Ref Range Status   Specimen Description CSF  Final   Special Requests NONE  Final   Gram Stain   Final    WBC PRESENT, PREDOMINANTLY MONONUCLEAR NO ORGANISMS SEEN CYTOSPIN SMEAR    Culture NO GROWTH 3 DAYS  Final   Report Status PENDING  Incomplete     Labs: BNP (last 3  results) No results for input(s): BNP in the last 8760 hours. Basic Metabolic Panel:  Recent Labs Lab 10/26/16 0300 10/27/16 0400 10/27/16 0900 10/28/16 0435 10/29/16 0244 10/30/16 0514 10/31/16 0504 11/01/16 0345  NA 135 137  --  136 135 137 136 137  K 3.1* 4.0  --  4.0 3.5 3.7 3.8 3.8  CL 105 106  --  102 101 103 101 104  CO2 20* 21*  --  24 27 23 24 24   GLUCOSE 101* 90  --  97 87 86 83 100*  BUN <5* <5*  --  7 6 6 6 8   CREATININE 0.87 0.81  --  0.79 0.79 0.83 0.87 0.77  CALCIUM 8.5* 8.8*  --  9.0 8.8* 9.0 9.2 9.1  MG 1.7 1.9  --  2.0  --  1.9  --   --   PHOS 2.7  --  4.9* 4.3  --   --   --   --    Liver Function Tests:  Recent Labs Lab 10/27/16 0900 10/30/16 0514  AST 69* 28  ALT 143* 67*  ALKPHOS 45 52  BILITOT 0.6 0.6  PROT 6.3* 6.3*  ALBUMIN 2.8* 3.0*   No results for input(s): LIPASE, AMYLASE in the last 168 hours.  Recent Labs Lab 10/27/16 0900  AMMONIA 11   CBC:  Recent Labs Lab 10/28/16 0435 10/29/16 0244 10/30/16 0514 10/31/16 0504 11/01/16 0345  WBC 3.9* 5.4 6.5 6.8 6.4  NEUTROABS  --  2.3 3.0 3.2 3.3  HGB 9.6* 9.9* 10.5* 10.4* 10.4*  HCT 29.0* 29.1* 31.7* 32.1* 31.7*  MCV 87.6 86.9 88.8 89.4 89.0  PLT 217 241 285 287 313   Cardiac Enzymes: No results for input(s): CKTOTAL, CKMB, CKMBINDEX, TROPONINI in the last 168 hours. BNP: Invalid input(s): POCBNP CBG:  Recent Labs Lab 10/27/16 2357 10/28/16 0422 10/28/16 0747 10/28/16 1137 10/28/16 1637  GLUCAP 77 94 89 88 89   D-Dimer No results for input(s): DDIMER in the last 72 hours. Hgb A1c No results for input(s): HGBA1C in the last 72 hours. Lipid Profile No results for input(s): CHOL, HDL, LDLCALC, TRIG, CHOLHDL, LDLDIRECT in the last 72 hours. Thyroid function studies No results for input(s): TSH, T4TOTAL, T3FREE, THYROIDAB in the last 72 hours.  Invalid input(s): FREET3 Anemia work up No results for input(s): VITAMINB12, FOLATE, FERRITIN, TIBC, IRON, RETICCTPCT in the  last 72 hours. Urinalysis    Component Value Date/Time   COLORURINE YELLOW 10/22/2016 0830  APPEARANCEUR CLOUDY (A) 10/22/2016 0830   LABSPEC 1.027 10/22/2016 0830   PHURINE 5.0 10/22/2016 0830   GLUCOSEU NEGATIVE 10/22/2016 0830   HGBUR SMALL (A) 10/22/2016 0830   BILIRUBINUR NEGATIVE 10/22/2016 0830   KETONESUR NEGATIVE 10/22/2016 0830   PROTEINUR NEGATIVE 10/22/2016 0830   NITRITE POSITIVE (A) 10/22/2016 0830   LEUKOCYTESUR MODERATE (A) 10/22/2016 0830   Sepsis Labs Invalid input(s): PROCALCITONIN,  WBC,  LACTICIDVEN Microbiology Recent Results (from the past 240 hour(s))  CSF culture     Status: None (Preliminary result)   Collection Time: 10/28/16  2:00 PM  Result Value Ref Range Status   Specimen Description CSF  Final   Special Requests NONE  Final   Gram Stain   Final    WBC PRESENT, PREDOMINANTLY MONONUCLEAR NO ORGANISMS SEEN CYTOSPIN SMEAR    Culture NO GROWTH 3 DAYS  Final   Report Status PENDING  Incomplete     Time coordinating discharge: Over 30 minutes  SIGNED:  Marzetta Board, MD  [facility] 11/01/2016, 3:18 PM Pager [phone]  If 7PM-7AM, please contact night-coverage [email or web address] Password [email or web address]

## 2016-11-02 DIAGNOSIS — R2689 Other abnormalities of gait and mobility: Secondary | ICD-10-CM | POA: Diagnosis not present

## 2016-11-02 DIAGNOSIS — I420 Dilated cardiomyopathy: Secondary | ICD-10-CM | POA: Diagnosis not present

## 2016-11-02 DIAGNOSIS — Z8674 Personal history of sudden cardiac arrest: Secondary | ICD-10-CM | POA: Diagnosis not present

## 2016-11-02 DIAGNOSIS — J9601 Acute respiratory failure with hypoxia: Secondary | ICD-10-CM | POA: Diagnosis not present

## 2016-11-02 DIAGNOSIS — G40909 Epilepsy, unspecified, not intractable, without status epilepticus: Secondary | ICD-10-CM | POA: Diagnosis not present

## 2016-11-02 DIAGNOSIS — I517 Cardiomegaly: Secondary | ICD-10-CM | POA: Diagnosis not present

## 2016-11-03 DIAGNOSIS — I420 Dilated cardiomyopathy: Secondary | ICD-10-CM | POA: Diagnosis not present

## 2016-11-03 DIAGNOSIS — I517 Cardiomegaly: Secondary | ICD-10-CM | POA: Diagnosis not present

## 2016-11-03 DIAGNOSIS — J9601 Acute respiratory failure with hypoxia: Secondary | ICD-10-CM | POA: Diagnosis not present

## 2016-11-03 DIAGNOSIS — Z8674 Personal history of sudden cardiac arrest: Secondary | ICD-10-CM | POA: Diagnosis not present

## 2016-11-03 DIAGNOSIS — G40909 Epilepsy, unspecified, not intractable, without status epilepticus: Secondary | ICD-10-CM | POA: Diagnosis not present

## 2016-11-03 DIAGNOSIS — R2689 Other abnormalities of gait and mobility: Secondary | ICD-10-CM | POA: Diagnosis not present

## 2016-11-06 DIAGNOSIS — R2689 Other abnormalities of gait and mobility: Secondary | ICD-10-CM | POA: Diagnosis not present

## 2016-11-06 DIAGNOSIS — J9601 Acute respiratory failure with hypoxia: Secondary | ICD-10-CM | POA: Diagnosis not present

## 2016-11-06 DIAGNOSIS — G40909 Epilepsy, unspecified, not intractable, without status epilepticus: Secondary | ICD-10-CM | POA: Diagnosis not present

## 2016-11-06 DIAGNOSIS — I517 Cardiomegaly: Secondary | ICD-10-CM | POA: Diagnosis not present

## 2016-11-06 DIAGNOSIS — I420 Dilated cardiomyopathy: Secondary | ICD-10-CM | POA: Diagnosis not present

## 2016-11-06 DIAGNOSIS — Z8674 Personal history of sudden cardiac arrest: Secondary | ICD-10-CM | POA: Diagnosis not present

## 2016-11-08 ENCOUNTER — Other Ambulatory Visit: Payer: Self-pay

## 2016-11-08 ENCOUNTER — Encounter: Payer: Self-pay | Admitting: Internal Medicine

## 2016-11-08 ENCOUNTER — Ambulatory Visit: Payer: Medicare Other | Attending: Internal Medicine | Admitting: Internal Medicine

## 2016-11-08 DIAGNOSIS — Z8674 Personal history of sudden cardiac arrest: Secondary | ICD-10-CM | POA: Diagnosis not present

## 2016-11-08 DIAGNOSIS — I469 Cardiac arrest, cause unspecified: Secondary | ICD-10-CM | POA: Diagnosis not present

## 2016-11-08 DIAGNOSIS — I517 Cardiomegaly: Secondary | ICD-10-CM | POA: Diagnosis not present

## 2016-11-08 DIAGNOSIS — I420 Dilated cardiomyopathy: Secondary | ICD-10-CM | POA: Diagnosis not present

## 2016-11-08 DIAGNOSIS — J9601 Acute respiratory failure with hypoxia: Secondary | ICD-10-CM | POA: Diagnosis not present

## 2016-11-08 DIAGNOSIS — R2689 Other abnormalities of gait and mobility: Secondary | ICD-10-CM | POA: Diagnosis not present

## 2016-11-08 DIAGNOSIS — G40909 Epilepsy, unspecified, not intractable, without status epilepticus: Secondary | ICD-10-CM | POA: Diagnosis not present

## 2016-11-08 NOTE — Patient Instructions (Signed)
Continue your medications. We will send note and EKG to Dr. Einar Gip. Call for any questions  Congratulations on stopping alll alcohol and tobacco.

## 2016-11-08 NOTE — Assessment & Plan Note (Signed)
SHE HAD AN OUT OF HOSPITAL ARREST Unclear etiology but I suspect related to etoh abuse. She is doing well. Has life Vest. Appt. With Dr. Einar Gip in 7 days  Continue same meds i'll check ekg today and send to Dr. Einar Gip. On exam she is in bigeminy and this likely explains her "low" heart rate.   Other abnormalities in hospital (liver enzymes) likely a result of cardiac arrest- improving  I think she can come off oxygen soon. O2 sats on RA= 98%

## 2016-11-08 NOTE — Progress Notes (Signed)
HHN concerned patient HR too low to take meds.  Patient comes here for phfu Out of hospital arrest, also had seizures, required oxygen and had elevated liver enzymes.   She has been doing well at home. Home health has been with her at home a few days a week  She has no chest pain, sob, N/V  LMP- high school (depo injections).   No etoh or tobacco since hospitalization.   No past medical history on file.  Social History   Social History  . Marital status: Single    Spouse name: N/A  . Number of children: N/A  . Years of education: N/A   Occupational History  . Not on file.   Social History Main Topics  . Smoking status: Current Every Day Smoker    Packs/day: 0.50    Types: Cigarettes  . Smokeless tobacco: Never Used  . Alcohol use Yes  . Drug use: No  . Sexual activity: Not Currently   Other Topics Concern  . Not on file   Social History Narrative  . No narrative on file    Past Surgical History:  Procedure Laterality Date  . CARDIAC CATHETERIZATION N/A 10/21/2016   Procedure: Left Heart Cath and Coronary Angiography;  Surgeon: Adrian Prows, MD;  Location: [facility];  Service: Cardiovascular;  Laterality: N/A;    No family history on file.  No Known Allergies  Current Outpatient Prescriptions on File Prior to Visit  Medication Sig Dispense Refill  . medroxyPROGESTERone (DEPO-PROVERA) 150 MG/ML injection Inject 150 mg into the muscle every 3 (three) months.    . metoprolol tartrate (LOPRESSOR) 25 MG tablet Take 1 tablet (25 mg total) by mouth 3 (three) times daily. 90 tablet 1   No current facility-administered medications on file prior to visit.      patient denies chest pain, shortness of breath, orthopnea. Denies lower extremity edema, abdominal pain, change in appetite, change in bowel movements. Patient denies rashes, musculoskeletal complaints. No other specific complaints in a complete review of systems.   BP 122/82   Pulse (!) 43   Temp 99.5  F (37.5 C) (Axillary)   Resp 16   Ht 5\' 3"  (1.6 m)   Wt 162 lb (73.5 kg)   LMP 10/25/2016 Comment: Preg Test Negative  SpO2 99%   BMI 28.70 kg/m   Well-developed well-nourished female in no acute distress. HEENT exam atraumatic, normocephalic, extraocular muscles are intact. Neck is supple. No jugular venous distention no thyromegaly. Chest clear to auscultation without increased work of breathing. Cardiac exam S1 and S2 are regular  IN A PATTERN OF BIGEMINY. Abdominal exam active bowel sounds, soft, nontender. Extremities no edema. Neurologic exam she is alert without any motor sensory deficits. Gait is normal.  Cardiac arrest ([place]) SHE HAD AN [place] ARREST Unclear etiology but I suspect related to etoh abuse. She is doing well. Has life Vest. Appt. With Dr. Einar Gip in 7 days  Continue same meds i'll check ekg today and send to Dr. Einar Gip. On exam she is in bigeminy and this likely explains her "low" heart rate.   Other abnormalities in hospital (liver enzymes) likely a result of cardiac arrest- improving  I think she can come off oxygen soon. O2 sats on RA= 98%

## 2016-11-10 DIAGNOSIS — Z8674 Personal history of sudden cardiac arrest: Secondary | ICD-10-CM | POA: Diagnosis not present

## 2016-11-10 DIAGNOSIS — J9601 Acute respiratory failure with hypoxia: Secondary | ICD-10-CM | POA: Diagnosis not present

## 2016-11-10 DIAGNOSIS — I517 Cardiomegaly: Secondary | ICD-10-CM | POA: Diagnosis not present

## 2016-11-10 DIAGNOSIS — G40909 Epilepsy, unspecified, not intractable, without status epilepticus: Secondary | ICD-10-CM | POA: Diagnosis not present

## 2016-11-10 DIAGNOSIS — I420 Dilated cardiomyopathy: Secondary | ICD-10-CM | POA: Diagnosis not present

## 2016-11-10 DIAGNOSIS — R2689 Other abnormalities of gait and mobility: Secondary | ICD-10-CM | POA: Diagnosis not present

## 2016-11-14 DIAGNOSIS — J9601 Acute respiratory failure with hypoxia: Secondary | ICD-10-CM | POA: Diagnosis not present

## 2016-11-14 DIAGNOSIS — Z8674 Personal history of sudden cardiac arrest: Secondary | ICD-10-CM | POA: Diagnosis not present

## 2016-11-14 DIAGNOSIS — I420 Dilated cardiomyopathy: Secondary | ICD-10-CM | POA: Diagnosis not present

## 2016-11-14 DIAGNOSIS — R2689 Other abnormalities of gait and mobility: Secondary | ICD-10-CM | POA: Diagnosis not present

## 2016-11-14 DIAGNOSIS — I517 Cardiomegaly: Secondary | ICD-10-CM | POA: Diagnosis not present

## 2016-11-14 DIAGNOSIS — G40909 Epilepsy, unspecified, not intractable, without status epilepticus: Secondary | ICD-10-CM | POA: Diagnosis not present

## 2016-11-15 DIAGNOSIS — I493 Ventricular premature depolarization: Secondary | ICD-10-CM | POA: Diagnosis not present

## 2016-11-15 DIAGNOSIS — Z87898 Personal history of other specified conditions: Secondary | ICD-10-CM | POA: Diagnosis not present

## 2016-11-15 DIAGNOSIS — Z8674 Personal history of sudden cardiac arrest: Secondary | ICD-10-CM | POA: Diagnosis not present

## 2016-11-17 DIAGNOSIS — I420 Dilated cardiomyopathy: Secondary | ICD-10-CM | POA: Diagnosis not present

## 2016-11-17 DIAGNOSIS — Z8674 Personal history of sudden cardiac arrest: Secondary | ICD-10-CM | POA: Diagnosis not present

## 2016-11-17 DIAGNOSIS — G40909 Epilepsy, unspecified, not intractable, without status epilepticus: Secondary | ICD-10-CM | POA: Diagnosis not present

## 2016-11-17 DIAGNOSIS — J9601 Acute respiratory failure with hypoxia: Secondary | ICD-10-CM | POA: Diagnosis not present

## 2016-11-17 DIAGNOSIS — R2689 Other abnormalities of gait and mobility: Secondary | ICD-10-CM | POA: Diagnosis not present

## 2016-11-17 DIAGNOSIS — I517 Cardiomegaly: Secondary | ICD-10-CM | POA: Diagnosis not present

## 2016-11-21 DIAGNOSIS — J9601 Acute respiratory failure with hypoxia: Secondary | ICD-10-CM | POA: Diagnosis not present

## 2016-11-21 DIAGNOSIS — I420 Dilated cardiomyopathy: Secondary | ICD-10-CM | POA: Diagnosis not present

## 2016-11-21 DIAGNOSIS — G40909 Epilepsy, unspecified, not intractable, without status epilepticus: Secondary | ICD-10-CM | POA: Diagnosis not present

## 2016-11-21 DIAGNOSIS — I517 Cardiomegaly: Secondary | ICD-10-CM | POA: Diagnosis not present

## 2016-11-21 DIAGNOSIS — R2689 Other abnormalities of gait and mobility: Secondary | ICD-10-CM | POA: Diagnosis not present

## 2016-11-21 DIAGNOSIS — Z8674 Personal history of sudden cardiac arrest: Secondary | ICD-10-CM | POA: Diagnosis not present

## 2016-11-23 DIAGNOSIS — Z8674 Personal history of sudden cardiac arrest: Secondary | ICD-10-CM | POA: Diagnosis not present

## 2016-11-23 DIAGNOSIS — J9601 Acute respiratory failure with hypoxia: Secondary | ICD-10-CM | POA: Diagnosis not present

## 2016-11-23 DIAGNOSIS — I420 Dilated cardiomyopathy: Secondary | ICD-10-CM | POA: Diagnosis not present

## 2016-11-23 DIAGNOSIS — I517 Cardiomegaly: Secondary | ICD-10-CM | POA: Diagnosis not present

## 2016-11-23 DIAGNOSIS — G40909 Epilepsy, unspecified, not intractable, without status epilepticus: Secondary | ICD-10-CM | POA: Diagnosis not present

## 2016-11-23 DIAGNOSIS — R2689 Other abnormalities of gait and mobility: Secondary | ICD-10-CM | POA: Diagnosis not present

## 2016-11-30 DIAGNOSIS — R2689 Other abnormalities of gait and mobility: Secondary | ICD-10-CM | POA: Diagnosis not present

## 2016-11-30 DIAGNOSIS — Z8674 Personal history of sudden cardiac arrest: Secondary | ICD-10-CM | POA: Diagnosis not present

## 2016-11-30 DIAGNOSIS — G40909 Epilepsy, unspecified, not intractable, without status epilepticus: Secondary | ICD-10-CM | POA: Diagnosis not present

## 2016-11-30 DIAGNOSIS — J9601 Acute respiratory failure with hypoxia: Secondary | ICD-10-CM | POA: Diagnosis not present

## 2016-11-30 DIAGNOSIS — I517 Cardiomegaly: Secondary | ICD-10-CM | POA: Diagnosis not present

## 2016-11-30 DIAGNOSIS — I420 Dilated cardiomyopathy: Secondary | ICD-10-CM | POA: Diagnosis not present

## 2016-12-04 ENCOUNTER — Ambulatory Visit: Payer: Medicare Other | Admitting: Internal Medicine

## 2016-12-11 ENCOUNTER — Encounter: Payer: Self-pay | Admitting: Internal Medicine

## 2016-12-11 ENCOUNTER — Ambulatory Visit: Payer: Medicare Other | Attending: Internal Medicine | Admitting: Internal Medicine

## 2016-12-11 VITALS — BP 111/72 | HR 55 | Temp 98.1°F | Resp 16 | Wt 167.8 lb

## 2016-12-11 DIAGNOSIS — E784 Other hyperlipidemia: Secondary | ICD-10-CM

## 2016-12-11 DIAGNOSIS — Z8674 Personal history of sudden cardiac arrest: Secondary | ICD-10-CM | POA: Diagnosis not present

## 2016-12-11 DIAGNOSIS — D649 Anemia, unspecified: Secondary | ICD-10-CM | POA: Diagnosis not present

## 2016-12-11 DIAGNOSIS — Z114 Encounter for screening for human immunodeficiency virus [HIV]: Secondary | ICD-10-CM

## 2016-12-11 DIAGNOSIS — R002 Palpitations: Secondary | ICD-10-CM | POA: Diagnosis not present

## 2016-12-11 DIAGNOSIS — Z23 Encounter for immunization: Secondary | ICD-10-CM | POA: Diagnosis not present

## 2016-12-11 DIAGNOSIS — E785 Hyperlipidemia, unspecified: Secondary | ICD-10-CM | POA: Diagnosis not present

## 2016-12-11 DIAGNOSIS — I469 Cardiac arrest, cause unspecified: Secondary | ICD-10-CM | POA: Diagnosis not present

## 2016-12-11 DIAGNOSIS — R739 Hyperglycemia, unspecified: Secondary | ICD-10-CM | POA: Diagnosis not present

## 2016-12-11 DIAGNOSIS — Z09 Encounter for follow-up examination after completed treatment for conditions other than malignant neoplasm: Secondary | ICD-10-CM | POA: Diagnosis not present

## 2016-12-11 DIAGNOSIS — E7849 Other hyperlipidemia: Secondary | ICD-10-CM

## 2016-12-11 DIAGNOSIS — F17210 Nicotine dependence, cigarettes, uncomplicated: Secondary | ICD-10-CM | POA: Diagnosis not present

## 2016-12-11 LAB — CBC WITH DIFFERENTIAL/PLATELET
BASOS ABS: 0 {cells}/uL (ref 0–200)
Basophils Relative: 0 %
EOS ABS: 52 {cells}/uL (ref 15–500)
Eosinophils Relative: 1 %
HCT: 37.2 % (ref 35.0–45.0)
Hemoglobin: 12 g/dL (ref 11.7–15.5)
LYMPHS PCT: 44 %
Lymphs Abs: 2288 cells/uL (ref 850–3900)
MCH: 29.1 pg (ref 27.0–33.0)
MCHC: 32.3 g/dL (ref 32.0–36.0)
MCV: 90.3 fL (ref 80.0–100.0)
MPV: 10.4 fL (ref 7.5–12.5)
Monocytes Absolute: 364 cells/uL (ref 200–950)
Monocytes Relative: 7 %
Neutro Abs: 2496 cells/uL (ref 1500–7800)
Neutrophils Relative %: 48 %
PLATELETS: 341 10*3/uL (ref 140–400)
RBC: 4.12 MIL/uL (ref 3.80–5.10)
RDW: 13.5 % (ref 11.0–15.0)
WBC: 5.2 10*3/uL (ref 3.8–10.8)

## 2016-12-11 LAB — LIPID PANEL
CHOL/HDL RATIO: 3.5 ratio (ref <5.0)
CHOLESTEROL: 146 mg/dL (ref <200)
HDL: 42 mg/dL — ABNORMAL LOW (ref >50)
LDL Cholesterol: 94 mg/dL (ref <100)
Triglycerides: 48 mg/dL (ref <150)
VLDL: 10 mg/dL (ref <30)

## 2016-12-11 LAB — IRON,TIBC AND FERRITIN PANEL
%SAT: 19 % (ref 11–50)
Ferritin: 78 ng/mL (ref 10–154)
IRON: 71 ug/dL (ref 40–190)
TIBC: 368 ug/dL (ref 250–450)

## 2016-12-11 LAB — POCT GLYCOSYLATED HEMOGLOBIN (HGB A1C): Hemoglobin A1C: 5.5

## 2016-12-11 LAB — TSH: TSH: 2.27 mIU/L

## 2016-12-11 LAB — HIV ANTIBODY (ROUTINE TESTING W REFLEX): HIV 1&2 Ab, 4th Generation: NONREACTIVE

## 2016-12-11 NOTE — Patient Instructions (Addendum)
Low-Sodium Eating Plan Sodium raises blood pressure and causes water to be held in the body. Getting less sodium from food will help lower your blood pressure, reduce any swelling, and protect your heart, liver, and kidneys. We get sodium by adding salt (sodium chloride) to food. Most of our sodium comes from canned, boxed, and frozen foods. Restaurant foods, fast foods, and pizza are also very high in sodium. Even if you take medicine to lower your blood pressure or to reduce fluid in your body, getting less sodium from your food is important. What is my plan? Most people should limit their sodium intake to 2,300 mg a day. Your health care provider recommends that you limit your sodium intake to '000mg'$  a day. What do I need to know about this eating plan? For the low-sodium eating plan, you will follow these general guidelines:  Choose foods with a % Daily Value for sodium of less than 5% (as listed on the food label).  Use salt-free seasonings or herbs instead of table salt or sea salt.  Check with your health care provider or pharmacist before using salt substitutes.  Eat fresh foods.  Eat more vegetables and fruits.  Limit canned vegetables. If you do use them, rinse them well to decrease the sodium.  Limit cheese to 1 oz (28 g) per day.  Eat lower-sodium products, often labeled as "lower sodium" or "no salt added."  Avoid foods that contain monosodium glutamate (MSG). MSG is sometimes added to [place] food and some canned foods.  Check food labels (Nutrition Facts labels) on foods to learn how much sodium is in one serving.  Eat more home-cooked food and less restaurant, buffet, and fast food.  When eating at a restaurant, ask that your food be prepared with less salt, or no salt if possible. How do I read food labels for sodium information? The Nutrition Facts label lists the amount of sodium in one serving of the food. If you eat more than one serving, you must multiply the  listed amount of sodium by the number of servings. Food labels may also identify foods as:  Sodium free-Less than 5 mg in a serving.  Very low sodium-35 mg or less in a serving.  Low sodium-140 mg or less in a serving.  Light in sodium-50% less sodium in a serving. For example, if a food that usually has 300 mg of sodium is changed to become light in sodium, it will have 150 mg of sodium.  Reduced sodium-25% less sodium in a serving. For example, if a food that usually has 400 mg of sodium is changed to reduced sodium, it will have 300 mg of sodium. What foods can I eat? Grains  Low-sodium cereals, including oats, puffed wheat and rice, and shredded wheat cereals. Low-sodium crackers. Unsalted rice and pasta. Lower-sodium bread. Vegetables  Frozen or fresh vegetables. Low-sodium or reduced-sodium canned vegetables. Low-sodium or reduced-sodium tomato sauce and paste. Low-sodium or reduced-sodium tomato and vegetable juices. Fruits  Fresh, frozen, and canned fruit. Fruit juice. Meat and Other Protein Products  Low-sodium canned tuna and salmon. Fresh or frozen meat, poultry, seafood, and fish. Lamb. Unsalted nuts. Dried beans, peas, and lentils without added salt. Unsalted canned beans. Homemade soups without salt. Eggs. Dairy  Milk. Soy milk. Ricotta cheese. Low-sodium or reduced-sodium cheeses. Yogurt. Condiments  Fresh and dried herbs and spices. Salt-free seasonings. Onion and garlic powders. Low-sodium varieties of mustard and ketchup. Fresh or refrigerated horseradish. Lemon juice. Fats and Oils  Reduced-sodium  salad dressings. Unsalted butter. Other  Unsalted popcorn and pretzels. The items listed above may not be a complete list of recommended foods or beverages. Contact your dietitian for more options.  What foods are not recommended? Grains  Instant hot cereals. Bread stuffing, pancake, and biscuit mixes. Croutons. Seasoned rice or pasta mixes. Noodle soup cups. Boxed or  frozen macaroni and cheese. Self-rising flour. Regular salted crackers. Vegetables  Regular canned vegetables. Regular canned tomato sauce and paste. Regular tomato and vegetable juices. Frozen vegetables in sauces. Salted [place] fries. Olives. Angie Fava. Relishes. Sauerkraut. Salsa. Meat and Other Protein Products  Salted, canned, smoked, spiced, or pickled meats, seafood, or fish. Bacon, ham, sausage, hot dogs, corned beef, chipped beef, and packaged luncheon meats. Salt pork. Jerky. Pickled herring. Anchovies, regular canned tuna, and sardines. Salted nuts. Dairy  Processed cheese and cheese spreads. Cheese curds. Blue cheese and cottage cheese. Buttermilk. Condiments  Onion and garlic salt, seasoned salt, table salt, and sea salt. Canned and packaged gravies. Worcestershire sauce. Tartar sauce. Barbecue sauce. Teriyaki sauce. Soy sauce, including reduced sodium. Steak sauce. Fish sauce. Oyster sauce. Cocktail sauce. Horseradish that you find on the shelf. Regular ketchup and mustard. Meat flavorings and tenderizers. Bouillon cubes. Hot sauce. Tabasco sauce. Marinades. Taco seasonings. Relishes. Fats and Oils  Regular salad dressings. Salted butter. Margarine. Ghee. Bacon fat. Other  Potato and tortilla chips. Corn chips and puffs. Salted popcorn and pretzels. Canned or dried soups. Pizza. Frozen entrees and pot pies. The items listed above may not be a complete list of foods and beverages to avoid. Contact your dietitian for more information.  This information is not intended to replace advice given to you by your health care provider. Make sure you discuss any questions you have with your health care provider. Document Released: 04/28/2002 Document Revised: 04/13/2016 Document Reviewed: 09/10/2013 Elsevier Interactive Patient Education  2017 Elsevier Inc.  -  Hypertension Hypertension is another name for high blood pressure. High blood pressure forces your heart to work harder to pump blood.  A blood pressure reading has two numbers, which includes a higher number over a lower number (example: 110/72). Follow these instructions at home:  Have your blood pressure rechecked by your doctor.  Only take medicine as told by your doctor. Follow the directions carefully. The medicine does not work as well if you skip doses. Skipping doses also puts you at risk for problems.  Do not smoke.  Monitor your blood pressure at home as told by your doctor. Contact a doctor if:  You think you are having a reaction to the medicine you are taking.  You have repeat headaches or feel dizzy.  You have puffiness (swelling) in your ankles.  You have trouble with your vision. Get help right away if:  You get a very bad headache and are confused.  You feel weak, numb, or faint.  You get chest or belly (abdominal) pain.  You throw up (vomit).  You cannot breathe very well. This information is not intended to replace advice given to you by your health care provider. Make sure you discuss any questions you have with your health care provider. Document Released: 04/24/2008 Document Revised: 04/13/2016 Document Reviewed: 08/29/2013 Elsevier Interactive Patient Education  2017 [place] (Tetanus and Diphtheria): What You Need to Know 1. Why get vaccinated? Tetanus  and diphtheria are very serious diseases. They are rare in the [place] today, but people who do become infected often have severe complications. Td vaccine  is used to protect adolescents and adults from both of these diseases. Both tetanus and diphtheria are infections caused by bacteria. Diphtheria spreads from person to person through coughing or sneezing. Tetanus-causing bacteria enter the body through cuts, scratches, or wounds. TETANUS (lockjaw) causes painful muscle tightening and stiffness, usually all over the body.  It can lead to tightening of muscles in the head and neck so you can't open your mouth,  swallow, or sometimes even breathe. Tetanus kills about 1 out of every 10 people who are infected even after receiving the best medical care. DIPHTHERIA can cause a thick coating to form in the back of the throat.  It can lead to breathing problems, paralysis, heart failure, and death. Before vaccines, as many as 200,000 cases of diphtheria and hundreds of cases of tetanus were reported in the [place] each year. Since vaccination began, reports of cases for both diseases have dropped by about 99%. 2. Td vaccine Td vaccine can protect adolescents and adults from tetanus and diphtheria. Td is usually given as a booster dose every 10 years but it can also be given earlier after a severe and dirty wound or burn. Another vaccine, called Tdap, which protects against pertussis in addition to tetanus and diphtheria, is sometimes recommended instead of Td vaccine. Your doctor or the person giving you the vaccine can give you more information. Td may safely be given at the same time as other vaccines. 3. Some people should not get this vaccine  A person who has ever had a life-threatening allergic reaction after a previous dose of any tetanus or diphtheria containing vaccine, OR has a severe allergy to any part of this vaccine, should not get Td vaccine. Tell the person giving the vaccine about any severe allergies.  Talk to your doctor if you:  had severe pain or swelling after any vaccine containing diphtheria or tetanus,  ever had a condition called Guillain Barre Syndrome (GBS),  aren't feeling well on the day the shot is scheduled. 4. What are the risks from Td vaccine? With any medicine, including vaccines, there is a chance of side effects. These are usually mild and go away on their own. Serious reactions are also possible but are rare. Most people who get Td vaccine do not have any problems with it. Mild problems following Td vaccine: (Did not interfere with activities)  Pain where  the shot was given (about 8 people in 10)  Redness or swelling where the shot was given (about 1 person in 4)  Mild fever (rare)  Headache (about 1 person in 4)  Tiredness (about 1 person in 4) Moderate problems following Td vaccine: (Interfered with activities, but did not require medical attention)  Fever over 102F (rare) Severe problems following Td vaccine: (Unable to perform usual activities; required medical attention)  Swelling, severe pain, bleeding and/or redness in the arm where the shot was given (rare). Problems that could happen after any vaccine:  People sometimes faint after a medical procedure, including vaccination. Sitting or lying down for about 15 minutes can help prevent fainting, and injuries caused by a fall. Tell your doctor if you feel dizzy, or have vision changes or ringing in the ears.  Some people get severe pain in the shoulder and have difficulty moving the arm where a shot was given. This happens very rarely.  Any medication can cause a severe allergic reaction. Such reactions from a vaccine are very rare, estimated at fewer than 1 in a million doses,  and would happen within a few minutes to a few hours after the vaccination. As with any medicine, there is a very remote chance of a vaccine causing a serious injury or death. The safety of vaccines is always being monitored. For more information, visit: http://www.aguilar.org/ 5. What if there is a serious reaction? What should I look for? Look for anything that concerns you, such as signs of a severe allergic reaction, very high fever, or unusual behavior. Signs of a severe allergic reaction can include hives, swelling of the face and throat, difficulty breathing, a fast heartbeat, dizziness, and weakness. These would usually start a few minutes to a few hours after the vaccination. What should I do?  If you think it is a severe allergic reaction or other emergency that can't wait, call 9-1-1 or get  the person to the nearest hospital. Otherwise, call your doctor.  Afterward, the reaction should be reported to the Vaccine Adverse Event Reporting System (VAERS). Your doctor might file this report, or you can do it yourself through the VAERS web site at www.vaers.SamedayNews.es, or by calling 340-113-7845.  VAERS does not give medical advice. 6. The National Vaccine Injury Compensation Program The [employer] Vaccine Injury Compensation Program (VICP) is a federal program that was created to compensate people who may have been injured by certain vaccines. Persons who believe they may have been injured by a vaccine can learn about the program and about filing a claim by calling 863-279-7737 or visiting the [place] website at GoldCloset.com.ee. There is a time limit to file a claim for compensation. 7. How can I learn more?  Ask your doctor. He or she can give you the vaccine package insert or suggest other sources of information.  Call your local or state health department.  Contact the Centers for Disease Control and Prevention (CDC):  Call 431 275 2102 (1-800-CDC-INFO)  Visit CDC's website at http://hunter.com/ CDC Td Vaccine VIS (02/29/16) This information is not intended to replace advice given to you by your health care provider. Make sure you discuss any questions you have with your health care provider. Document Released: 09/03/2006 Document Revised: 07/27/2016 Document Reviewed: 07/27/2016 Elsevier Interactive Patient Education  2017 [employer].

## 2016-12-11 NOTE — Progress Notes (Signed)
Rachel Duncan, is a 36 y.o. female  [id number]  [id number]  DOB - 08-Jul-1981  CC: No chief complaint on file.      HPI: Rachel Duncan is a 36 y.o. female here today to establish medical care.  Last seen in clinic By Dr Leanne Chang 11/08/16, w/ hx of OOH cardiac arrest 10/21/16, sp cooling protocol, suspect related to etoh abuse at time.  Since than, she has f/u w/ Dr Einar Gip, cards, and currently on metoprolol 50bid per pt.  Denies any c/o at this time. She denies tob, but drinks 1 glass red wine once a week.   Cardiac catheterization 12/3 1. Normal coronary arteries. 2. Left ventricular systolic function appears to be preserved, EF 50%. Normal LVEDP.   2D echo 12/03 Study Conclusions - Left ventricle: The cavity size was normal. Systolic function wasseverely reduced. The estimated ejection fraction was in therange of 25% to 30%. Diffuse hypokinesis. Left ventriculardiastolic function parameters were normal. - Mitral valve: There was mild regurgitation. - Right ventricle: Systolic function was moderately reduced. - Atrial septum: No defect or patent foramen ovale was identified. - Tricuspid valve: There was mild-moderate regurgitation. PeakRV-RA gradient (S): 22 mm Hg.   2D echo 12/13 Study Conclusions - Left ventricle: The cavity size was normal. There was mildconcentric hypertrophy. Systolic function was mildly tomoderately reduced. The estimated ejection fraction was in therange of 40% to 45%. Diffuse hypokinesis. Left ventriculardiastolic function parameters were normal. - Mitral valve: There was mild regurgitation. - Pericardium, extracardiac: A trivial pericardial effusion wasidentified.    Patient has No headache, No chest pain, No abdominal pain - No Nausea, No new weakness tingling or numbness, No Cough - SOB.    Review of Systems: Per hpi, o/w all systems reviewed and negative.   No Known Allergies No past medical history on file. Current  Outpatient Prescriptions on File Prior to Visit  Medication Sig Dispense Refill  . metoprolol tartrate (LOPRESSOR) 25 MG tablet Take 1 tablet (25 mg total) by mouth 3 (three) times daily. 90 tablet 1   No current facility-administered medications on file prior to visit.    No family history on file. Social History   Social History  . Marital status: Single    Spouse name: N/A  . Number of children: N/A  . Years of education: N/A   Occupational History  . Not on file.   Social History Main Topics  . Smoking status: Current Every Day Smoker    Packs/day: 0.50    Types: Cigarettes  . Smokeless tobacco: Never Used  . Alcohol use Yes  . Drug use: No  . Sexual activity: Not Currently   Other Topics Concern  . Not on file   Social History Narrative  . No narrative on file    Objective:   Vitals:   12/11/16 1035  BP: 111/72  Pulse: (!) 55  Resp: 16  Temp: 98.1 F (36.7 C)    Filed Weights   12/11/16 1035  Weight: 167 lb 12.8 oz (76.1 kg)    BP Readings from Last 3 Encounters:  12/11/16 111/72  11/08/16 122/82  11/01/16 (!) 152/85    Physical Exam: Constitutional: Patient appears well-developed and well-nourished. No distress. AAOx3., pleasant. HENT: Normocephalic, atraumatic, External right and left ear normal. Oropharynx is clear and moist.  Eyes: Conjunctivae and EOM are normal. PERRL, no scleral icterus. Neck: Normal ROM. Neck supple. No JVD.  CVS: RRR, S1/S2 +, no murmurs, no gallops, no carotid bruit.  Pulmonary: Effort and  breath sounds normal, no stridor, rhonchi, wheezes, rales.  Abdominal: Soft. BS +, obese,  no distension, tenderness, rebound or guarding.  Musculoskeletal: Normal range of motion. No edema and no tenderness.  LE: bilat/ no c/c/e, pulses 2+ bilateral. Neuro: Alert. muscle tone coordination wnl. No cranial nerve deficit grossly. Skin: Skin is warm and dry. No rash noted. Not diaphoretic. No erythema. No pallor. Psychiatric: Normal  mood and affect. Behavior, judgment, thought content normal.  Lab Results  Component Value Date   WBC 6.4 11/01/2016   HGB 10.4 (L) 11/01/2016   HCT 31.7 (L) 11/01/2016   MCV 89.0 11/01/2016   PLT 313 11/01/2016   Lab Results  Component Value Date   CREATININE 0.77 11/01/2016   BUN 8 11/01/2016   NA 137 11/01/2016   K 3.8 11/01/2016   CL 104 11/01/2016   CO2 24 11/01/2016    No results found for: HGBA1C Lipid Panel  No results found for: CHOL, TRIG, HDL, CHOLHDL, VLDL, LDLCALC      Depression screen [facility] 2/9 12/11/2016 11/08/2016  Decreased Interest 0 1  Down, Depressed, Hopeless 0 2  PHQ - 2 Score 0 3  Altered sleeping 1 0  Tired, decreased energy 0 1  Change in appetite 0 0  Feeling bad or failure about yourself  0 0  Trouble concentrating 0 0  Moving slowly or fidgety/restless 0 1  Suicidal thoughts 0 0  PHQ-9 Score 1 5    Assessment and plan:   1. Anemia, unspecified type H/of, not on iron replacement currently.  irreg menses, has not had one months. - Iron, TIBC and Ferritin Panel - CBC with Differential  2. Cardiac arrest ([place]) 12/17, to see Dr Einar Gip, currently has Lifevest. - will ask cma to get records from Dr Einar Gip office. - per pt, currently on metoprolol 50mg  bid. Per Dr Einar Gip, will try to obtain outside records.  3. Encounter for screening for HIV - HIV antibody (with reflex)  4. H/o Hyperglycemia Diabetes screening - HgB A1c  5. Palpitations, h/o - TSH  6. Other hyperlipidemia - Lipid Panel  7. Drinks 1 glass wine /wk, - cautioned pt on etoh given concerns of cardiac arrest in Dec. 2017.  Return in about 4 weeks (around 01/08/2017) for pap.  The patient was given clear instructions to go to ER or return to medical center if symptoms don't improve, worsen or new problems develop. The patient verbalized understanding. The patient was told to call to get lab results if they haven't heard anything in the next week.    This note has been  created with Surveyor, quantity. Any transcriptional errors are unintentional.   Maren Reamer, MD, [place], [place]   12/11/2016, 11:02 AM

## 2016-12-12 ENCOUNTER — Institutional Professional Consult (permissible substitution): Payer: Medicare Other | Admitting: Internal Medicine

## 2016-12-13 ENCOUNTER — Telehealth: Payer: Self-pay

## 2016-12-13 ENCOUNTER — Encounter: Payer: Self-pay | Admitting: Internal Medicine

## 2016-12-13 ENCOUNTER — Ambulatory Visit (INDEPENDENT_AMBULATORY_CARE_PROVIDER_SITE_OTHER): Payer: Medicare Other | Admitting: Internal Medicine

## 2016-12-13 VITALS — BP 111/70 | HR 67 | Ht 63.0 in | Wt 163.0 lb

## 2016-12-13 DIAGNOSIS — Z01812 Encounter for preprocedural laboratory examination: Secondary | ICD-10-CM

## 2016-12-13 DIAGNOSIS — I4901 Ventricular fibrillation: Secondary | ICD-10-CM | POA: Diagnosis not present

## 2016-12-13 NOTE — Patient Instructions (Addendum)
Medication Instructions:  Your physician recommends that you continue on your current medications as directed. Please refer to the Current Medication list given to you today.   Labwork: BMET / CBC w/ diff in [facility] office on 01/02/17  Testing/Procedures: Subcutaneous ICD  Follow-Up: Follow-up for a wound check in the Device Clinic 10-14 days from 01/12/17  and with Dr. Lovena Le 91 days from 01/12/17.   Any Other Special Instructions Will Be Listed Below ---  Please wash with the CHG Soap the night before and morning of procedure (follow instruction page "Preparing For Surgery").   Report to the [place] [facility] on 01/12/17 at 9:30 AM  Nothing to eat or drink after midnight the night before procedure  Do not take any medication morning of procedrue  Plan 1 night stay

## 2016-12-13 NOTE — Telephone Encounter (Signed)
Contacted pt to go over lab results pt is aware of results and doesn't have any questions or concerns 

## 2016-12-13 NOTE — Progress Notes (Signed)
      HPI [name] is referred today by Dr. Einar Gip for consideration for ICD implantation. She is a pleasant 36 yo woman who survived a VF arrest several weeks ago. The etiology of her arrest is unclear. She has worn a life vest. Her EF is normal. She did not have an MI. She does not remember much about her hospital stay suffering from some amnesia. She denies chest pain or sob. No syncope prior to the arrest and no family h/o sudden death or unexplained syncope.  No Known Allergies   Current Outpatient Prescriptions  Medication Sig Dispense Refill  . metoprolol tartrate (LOPRESSOR) 25 MG tablet Take 1 tablet (25 mg total) by mouth 3 (three) times daily. 90 tablet 1   No current facility-administered medications for this visit.      History reviewed. No pertinent past medical history.  ROS:   All systems reviewed and negative except as noted in the HPI.   Past Surgical History:  Procedure Laterality Date  . CARDIAC CATHETERIZATION N/A 10/21/2016   Procedure: Left Heart Cath and Coronary Angiography;  Surgeon: Adrian Prows, MD;  Location: [place] [facility];  Service: Cardiovascular;  Laterality: N/A;     Family History  Problem Relation Age of Onset  . Heart attack Father      Social History   Social History  . Marital status: Single    Spouse name: N/A  . Number of children: N/A  . Years of education: N/A   Occupational History  . Not on file.   Social History Main Topics  . Smoking status: Current Every Day Smoker    Packs/day: 0.50    Types: Cigarettes  . Smokeless tobacco: Never Used  . Alcohol use Yes  . Drug use: No  . Sexual activity: Not Currently   Other Topics Concern  . Not on file   Social History Narrative  . No narrative on file     BP 111/70   Pulse 67   Ht 5\' 3"  (1.6 m)   Wt 163 lb (73.9 kg)   BMI 28.87 kg/m   Physical Exam:  Well appearing young woman, NAD HEENT: Unremarkable Neck:  No JVD, no thyromegally Lymphatics:  No  adenopathy Back:  No CVA tenderness Lungs:  Clear with no wheezes, life vest is in place HEART:  Regular rate rhythm, no murmurs, no rubs, no clicks Abd:  soft, positive bowel sounds, no organomegally, no rebound, no guarding Ext:  2 plus pulses, no edema, no cyanosis, no clubbing Skin:  No rashes no nodules Neuro:  CN II through XII intact, motor grossly intact  EKG - nsr  Assess/Plan: 1. VF arrest - she is wearing a life vest. I have discussed the treatment options with the patient including the risks/benefits/goals/expectations of ICD implant and she wishes to proceed. We discussed the treatment options in detail. Because of her very young age, my sense is that a SICD would be preferable.  2. Cardiomyopathy - initially after her arrest her EF was down but subsequent evaluation was normal. I wonder if she had acute myocarditis that resolved.  Mikle Bosworth.D.

## 2016-12-13 NOTE — Telephone Encounter (Signed)
Called, unable to reach pt. Left voice message to confirm pt's procedure arrival time of 9:30 AM (procedure time 11:30 AM) for ICD implant on 01/12/17. Asked pt to return call with questions or concerns.

## 2016-12-20 DIAGNOSIS — Z87898 Personal history of other specified conditions: Secondary | ICD-10-CM | POA: Diagnosis not present

## 2016-12-20 DIAGNOSIS — I493 Ventricular premature depolarization: Secondary | ICD-10-CM | POA: Diagnosis not present

## 2016-12-20 DIAGNOSIS — Z8674 Personal history of sudden cardiac arrest: Secondary | ICD-10-CM | POA: Diagnosis not present

## 2016-12-26 DIAGNOSIS — Z8679 Personal history of other diseases of the circulatory system: Secondary | ICD-10-CM | POA: Diagnosis not present

## 2016-12-26 DIAGNOSIS — Z8674 Personal history of sudden cardiac arrest: Secondary | ICD-10-CM | POA: Diagnosis not present

## 2016-12-28 DIAGNOSIS — J9601 Acute respiratory failure with hypoxia: Secondary | ICD-10-CM | POA: Diagnosis not present

## 2016-12-28 DIAGNOSIS — I517 Cardiomegaly: Secondary | ICD-10-CM | POA: Diagnosis not present

## 2016-12-28 DIAGNOSIS — G40909 Epilepsy, unspecified, not intractable, without status epilepticus: Secondary | ICD-10-CM | POA: Diagnosis not present

## 2016-12-28 DIAGNOSIS — I420 Dilated cardiomyopathy: Secondary | ICD-10-CM | POA: Diagnosis not present

## 2016-12-28 DIAGNOSIS — Z8674 Personal history of sudden cardiac arrest: Secondary | ICD-10-CM | POA: Diagnosis not present

## 2016-12-28 DIAGNOSIS — R2689 Other abnormalities of gait and mobility: Secondary | ICD-10-CM | POA: Diagnosis not present

## 2017-01-02 ENCOUNTER — Other Ambulatory Visit: Payer: Medicare Other | Admitting: *Deleted

## 2017-01-02 DIAGNOSIS — I469 Cardiac arrest, cause unspecified: Secondary | ICD-10-CM

## 2017-01-02 LAB — BASIC METABOLIC PANEL
BUN / CREAT RATIO: 13 (ref 9–23)
BUN: 11 mg/dL (ref 6–20)
CALCIUM: 9.4 mg/dL (ref 8.7–10.2)
CO2: 19 mmol/L (ref 18–29)
CREATININE: 0.84 mg/dL (ref 0.57–1.00)
Chloride: 100 mmol/L (ref 96–106)
GFR calc non Af Amer: 92 mL/min/{1.73_m2} (ref >59)
GFR, EST AFRICAN AMERICAN: 106 mL/min/{1.73_m2} (ref >59)
Glucose: 81 mg/dL (ref 65–99)
Potassium: 3.9 mmol/L (ref 3.5–5.2)
Sodium: 140 mmol/L (ref 134–144)

## 2017-01-02 LAB — CBC
HEMATOCRIT: 37.8 % (ref 34.0–46.6)
HEMOGLOBIN: 12.3 g/dL (ref 11.1–15.9)
MCH: 29 pg (ref 26.6–33.0)
MCHC: 32.5 g/dL (ref 31.5–35.7)
MCV: 89 fL (ref 79–97)
Platelets: 293 10*3/uL (ref 150–379)
RBC: 4.24 x10E6/uL (ref 3.77–5.28)
RDW: 13.7 % (ref 12.3–15.4)
WBC: 5.1 10*3/uL (ref 3.4–10.8)

## 2017-01-09 ENCOUNTER — Telehealth: Payer: Self-pay | Admitting: Internal Medicine

## 2017-01-09 NOTE — Telephone Encounter (Signed)
Called, spoke with pt. Pt requested to move subq ICD implant to 01/15/17. Called the hospital. I was informed the procedure date cannot be changed due to pt needing anesthesia (only EP 2 room available that day). Pt was okay with keeping the appt on for 01/12/17, arriving at 9:30 AM. Pt verbalized understanding.

## 2017-01-09 NOTE — Telephone Encounter (Signed)
New Message:     Pt says she wants to reschedule her procedure for Friday please.

## 2017-01-12 ENCOUNTER — Ambulatory Visit (HOSPITAL_COMMUNITY): Payer: Medicare Other | Admitting: Certified Registered Nurse Anesthetist

## 2017-01-12 ENCOUNTER — Encounter (HOSPITAL_COMMUNITY): Payer: Self-pay | Admitting: Certified Registered Nurse Anesthetist

## 2017-01-12 ENCOUNTER — Ambulatory Visit (HOSPITAL_COMMUNITY)
Admission: RE | Admit: 2017-01-12 | Discharge: 2017-01-13 | Disposition: A | Discharge status: Home / Self Care | Payer: Medicare Other | Source: Ambulatory Visit | Attending: Internal Medicine | Admitting: Internal Medicine

## 2017-01-12 ENCOUNTER — Encounter (HOSPITAL_COMMUNITY)
Admission: RE | Disposition: A | Discharge status: Home / Self Care | Payer: Self-pay | Source: Ambulatory Visit | Attending: Internal Medicine

## 2017-01-12 DIAGNOSIS — R413 Other amnesia: Secondary | ICD-10-CM | POA: Diagnosis not present

## 2017-01-12 DIAGNOSIS — F17210 Nicotine dependence, cigarettes, uncomplicated: Secondary | ICD-10-CM | POA: Insufficient documentation

## 2017-01-12 DIAGNOSIS — I462 Cardiac arrest due to underlying cardiac condition: Secondary | ICD-10-CM | POA: Diagnosis not present

## 2017-01-12 DIAGNOSIS — I4901 Ventricular fibrillation: Secondary | ICD-10-CM | POA: Diagnosis present

## 2017-01-12 DIAGNOSIS — I429 Cardiomyopathy, unspecified: Secondary | ICD-10-CM | POA: Insufficient documentation

## 2017-01-12 DIAGNOSIS — Z8249 Family history of ischemic heart disease and other diseases of the circulatory system: Secondary | ICD-10-CM | POA: Diagnosis not present

## 2017-01-12 DIAGNOSIS — J9601 Acute respiratory failure with hypoxia: Secondary | ICD-10-CM | POA: Diagnosis not present

## 2017-01-12 DIAGNOSIS — G9340 Encephalopathy, unspecified: Secondary | ICD-10-CM | POA: Diagnosis not present

## 2017-01-12 DIAGNOSIS — Z95810 Presence of automatic (implantable) cardiac defibrillator: Secondary | ICD-10-CM | POA: Insufficient documentation

## 2017-01-12 DIAGNOSIS — I469 Cardiac arrest, cause unspecified: Secondary | ICD-10-CM | POA: Diagnosis not present

## 2017-01-12 HISTORY — DX: Presence of automatic (implantable) cardiac defibrillator: Z95.810

## 2017-01-12 HISTORY — PX: SUBQ ICD IMPLANT: EP1223

## 2017-01-12 LAB — SURGICAL PCR SCREEN
MRSA, PCR: POSITIVE — AB
STAPHYLOCOCCUS AUREUS: POSITIVE — AB

## 2017-01-12 LAB — PREGNANCY, URINE: PREG TEST UR: NEGATIVE

## 2017-01-12 SURGERY — SUBQ ICD IMPLANT
Anesthesia: General

## 2017-01-12 MED ORDER — ONDANSETRON HCL 4 MG/2ML IJ SOLN: 4.0000 mg | Freq: Four times a day (QID) | INTRAMUSCULAR | Status: DC | PRN

## 2017-01-12 MED ORDER — SUCCINYLCHOLINE CHLORIDE 20 MG/ML IJ SOLN
INTRAMUSCULAR | Status: DC | PRN
  Administered 2017-01-12: 50 mg via INTRAVENOUS

## 2017-01-12 MED ORDER — MUPIROCIN 2 % EX OINT
TOPICAL_OINTMENT | CUTANEOUS | Status: AC
  Administered 2017-01-12: 1 via TOPICAL
  Filled 2017-01-12: qty 22

## 2017-01-12 MED ORDER — BUPIVACAINE HCL (PF) 0.25 % IJ SOLN
INTRAMUSCULAR | Status: DC | PRN
  Administered 2017-01-12: 80 mL

## 2017-01-12 MED ORDER — HEPARIN (PORCINE) IN NACL 2-0.9 UNIT/ML-% IJ SOLN
INTRAMUSCULAR | Status: AC
  Filled 2017-01-12: qty 500

## 2017-01-12 MED ORDER — ACETAMINOPHEN 325 MG PO TABS
325.0000 mg | ORAL_TABLET | ORAL | Status: DC | PRN
  Administered 2017-01-12: 650 mg via ORAL

## 2017-01-12 MED ORDER — LIDOCAINE 2% (20 MG/ML) 5 ML SYRINGE
INTRAMUSCULAR | Status: DC | PRN
  Administered 2017-01-12: 100 mg via INTRAVENOUS

## 2017-01-12 MED ORDER — YOU HAVE A PACEMAKER BOOK
Freq: Once | Status: AC
  Administered 2017-01-12: 21:00:00
  Filled 2017-01-12: qty 1

## 2017-01-12 MED ORDER — OFF THE BEAT BOOK
Freq: Once | Status: AC
Start: 2017-01-12 — End: 2017-01-12
  Administered 2017-01-12: 21:00:00
  Filled 2017-01-12: qty 1

## 2017-01-12 MED ORDER — BUPIVACAINE HCL (PF) 0.25 % IJ SOLN
INTRAMUSCULAR | Status: AC
  Filled 2017-01-12: qty 120

## 2017-01-12 MED ORDER — MIDAZOLAM HCL 2 MG/2ML IJ SOLN
INTRAMUSCULAR | Status: DC | PRN
Start: 2017-01-12 — End: 2017-01-12
  Administered 2017-01-12: 1 mg via INTRAVENOUS

## 2017-01-12 MED ORDER — MUPIROCIN 2 % EX OINT
1.0000 "application " | TOPICAL_OINTMENT | Freq: Once | CUTANEOUS | Status: AC
  Administered 2017-01-12: 1 via TOPICAL

## 2017-01-12 MED ORDER — CEFAZOLIN SODIUM-DEXTROSE 2-4 GM/100ML-% IV SOLN
2.0000 g | INTRAVENOUS | Status: AC
  Administered 2017-01-12: 2 g via INTRAVENOUS

## 2017-01-12 MED ORDER — CHLORHEXIDINE GLUCONATE CLOTH 2 % EX PADS
6.0000 | MEDICATED_PAD | Freq: Every day | CUTANEOUS | Status: DC
  Administered 2017-01-13: 06:00:00 6 via TOPICAL

## 2017-01-12 MED ORDER — ACETAMINOPHEN 325 MG PO TABS
ORAL_TABLET | ORAL | Status: AC
  Filled 2017-01-12: qty 2

## 2017-01-12 MED ORDER — SUGAMMADEX SODIUM 200 MG/2ML IV SOLN
INTRAVENOUS | Status: DC | PRN
  Administered 2017-01-12: 200 mg via INTRAVENOUS

## 2017-01-12 MED ORDER — ROCURONIUM BROMIDE 10 MG/ML (PF) SYRINGE
PREFILLED_SYRINGE | INTRAVENOUS | Status: DC | PRN
  Administered 2017-01-12: 40 mg via INTRAVENOUS

## 2017-01-12 MED ORDER — FENTANYL CITRATE (PF) 100 MCG/2ML IJ SOLN
INTRAMUSCULAR | Status: DC | PRN
  Administered 2017-01-12: 100 ug via INTRAVENOUS

## 2017-01-12 MED ORDER — IBUPROFEN 200 MG PO TABS
200.0000 mg | ORAL_TABLET | Freq: Four times a day (QID) | ORAL | Status: DC | PRN
  Administered 2017-01-13: 400 mg via ORAL
  Filled 2017-01-12: qty 2

## 2017-01-12 MED ORDER — CEFAZOLIN SODIUM-DEXTROSE 2-4 GM/100ML-% IV SOLN
INTRAVENOUS | Status: AC
  Filled 2017-01-12: qty 100

## 2017-01-12 MED ORDER — MUPIROCIN 2 % EX OINT
1.0000 "application " | TOPICAL_OINTMENT | Freq: Two times a day (BID) | CUTANEOUS | Status: DC
  Administered 2017-01-12 – 2017-01-13 (×2): 1 via NASAL

## 2017-01-12 MED ORDER — METOPROLOL TARTRATE 25 MG PO TABS
25.0000 mg | ORAL_TABLET | Freq: Two times a day (BID) | ORAL | Status: DC
  Administered 2017-01-12 – 2017-01-13 (×2): 25 mg via ORAL
  Filled 2017-01-12 (×2): qty 1

## 2017-01-12 MED ORDER — PROPOFOL 10 MG/ML IV BOLUS
INTRAVENOUS | Status: DC | PRN
  Administered 2017-01-12: 120 mg via INTRAVENOUS
  Administered 2017-01-12: 20 mg via INTRAVENOUS

## 2017-01-12 MED ORDER — HEPARIN (PORCINE) IN NACL 2-0.9 UNIT/ML-% IJ SOLN
INTRAMUSCULAR | Status: DC | PRN
  Administered 2017-01-12: 500 mL

## 2017-01-12 MED ORDER — GENTAMICIN SULFATE 40 MG/ML IJ SOLN
INTRAMUSCULAR | Status: AC
  Filled 2017-01-12: qty 2

## 2017-01-12 MED ORDER — SODIUM CHLORIDE 0.9 % IV SOLN
INTRAVENOUS | Status: DC
  Administered 2017-01-12: 11:00:00 via INTRAVENOUS

## 2017-01-12 MED ORDER — SODIUM CHLORIDE 0.9 % IR SOLN
80.0000 mg | Status: AC
  Administered 2017-01-12: 80 mg

## 2017-01-12 MED ORDER — CEFAZOLIN IN D5W 1 GM/50ML IV SOLN
1.0000 g | Freq: Four times a day (QID) | INTRAVENOUS | Status: AC
  Administered 2017-01-12 – 2017-01-13 (×3): 1 g via INTRAVENOUS
  Filled 2017-01-12 (×4): qty 50

## 2017-01-12 SURGICAL SUPPLY — 5 items
CABLE SURGICAL S-101-97-12 (CABLE) ×3 IMPLANT
ICD SUBCU MRI EMBLEM A219 (ICD Generator) ×3 IMPLANT
LEAD SUBQU EMBLEM 3501 (Pacemaker) ×3 IMPLANT
PAD DEFIB LIFELINK (PAD) ×3 IMPLANT
TRAY PACEMAKER INSERTION (PACKS) ×3 IMPLANT

## 2017-01-12 NOTE — Anesthesia Preprocedure Evaluation (Addendum)
Anesthesia Evaluation  Patient identified by MRN, date of birth, ID band Patient awake    Reviewed: Allergy & Precautions, NPO status , Patient's Chart, lab work & pertinent test results  Airway Mallampati: II  TM Distance: >3 FB Neck ROM: Full    Dental  (+) Teeth Intact, Dental Advisory Given   Pulmonary Current Smoker,    breath sounds clear to auscultation       Cardiovascular  Rhythm:Regular Rate:Normal  History noted. CG   Neuro/Psych Seizures -,     GI/Hepatic negative GI ROS, Neg liver ROS,   Endo/Other  negative endocrine ROS  Renal/GU negative Renal ROS     Musculoskeletal   Abdominal   Peds  Hematology   Anesthesia Other Findings   Reproductive/Obstetrics                            Anesthesia Physical Anesthesia Plan  ASA: III  Anesthesia Plan: General   Post-op Pain Management:    Induction: Intravenous  Airway Management Planned: Oral ETT  Additional Equipment:   Intra-op Plan:   Post-operative Plan: Possible Post-op intubation/ventilation  Informed Consent: I have reviewed the patients History and Physical, chart, labs and discussed the procedure including the risks, benefits and alternatives for the proposed anesthesia with the patient or authorized representative who has indicated his/her understanding and acceptance.   Dental advisory given  Plan Discussed with: CRNA and Anesthesiologist  Anesthesia Plan Comments:         Anesthesia Quick Evaluation

## 2017-01-12 NOTE — Progress Notes (Signed)
Dr Lovena Le paged to informed about couplets PVC's.    Waiting a call.

## 2017-01-12 NOTE — Transfer of Care (Signed)
Immediate Anesthesia Transfer of Care Note  Patient: Shaquasha Kwok  Procedure(s) Performed: Procedure(s): SubQ ICD Implant (N/A)  Patient Location: Cath Lab  Anesthesia Type:General  Level of Consciousness: awake, alert , oriented and patient cooperative  Airway & Oxygen Therapy: Patient Spontanous Breathing and Patient connected to nasal cannula oxygen  Post-op Assessment: Report given to RN, Post -op Vital signs reviewed and stable and Patient moving all extremities  Post vital signs: Reviewed and stable  Last Vitals:  Vitals:   01/12/17 1555 01/12/17 1600  BP: (!) 158/48 (!) 156/60  Pulse: (!) 37 (!) 40  Resp: 20 11  Temp:      Last Pain:  Vitals:   01/12/17 1011  TempSrc: Oral         Complications: No apparent anesthesia complications

## 2017-01-12 NOTE — Interval H&P Note (Signed)
History and Physical Interval Note:  01/12/2017 12:40 PM  Rachel Duncan  has presented today for surgery, with the diagnosis of Vfib  The various methods of treatment have been discussed with the patient and family. After consideration of risks, benefits and other options for treatment, the patient has consented to  Procedure(s): SubQ ICD Implant (N/A) as a surgical intervention .  The patient's history has been reviewed, patient examined, no change in status, stable for surgery.  I have reviewed the patient's chart and labs.  Questions were answered to the patient's satisfaction.     Cristopher Peru

## 2017-01-12 NOTE — Anesthesia Procedure Notes (Signed)
Procedure Name: Intubation Date/Time: 01/12/2017 1:18 PM Performed by: Julieta Bellini Pre-anesthesia Checklist: Patient identified, Emergency Drugs available, Suction available and Patient being monitored Patient Re-evaluated:Patient Re-evaluated prior to inductionOxygen Delivery Method: Circle system utilized Preoxygenation: Pre-oxygenation with 100% oxygen Intubation Type: IV induction Ventilation: Mask ventilation without difficulty Laryngoscope Size: Mac and 4 Grade View: Grade I Tube type: Oral Tube size: 7.0 mm Number of attempts: 1 Airway Equipment and Method: Stylet Placement Confirmation: ETT inserted through vocal cords under direct vision,  positive ETCO2 and breath sounds checked- equal and bilateral Secured at: 22 cm Tube secured with: Tape Dental Injury: Teeth and Oropharynx as per pre-operative assessment

## 2017-01-12 NOTE — H&P (View-Only) (Signed)
      HPI Rachel Duncan is referred today by Dr. Einar Gip for consideration for ICD implantation. She is a pleasant 36 yo woman who survived a VF arrest several weeks ago. The etiology of her arrest is unclear. She has worn a life vest. Her EF is normal. She did not have an MI. She does not remember much about her hospital stay suffering from some amnesia. She denies chest pain or sob. No syncope prior to the arrest and no family h/o sudden death or unexplained syncope.  No Known Allergies   Current Outpatient Prescriptions  Medication Sig Dispense Refill  . metoprolol tartrate (LOPRESSOR) 25 MG tablet Take 1 tablet (25 mg total) by mouth 3 (three) times daily. 90 tablet 1   No current facility-administered medications for this visit.      History reviewed. No pertinent past medical history.  ROS:   All systems reviewed and negative except as noted in the HPI.   Past Surgical History:  Procedure Laterality Date  . CARDIAC CATHETERIZATION N/A 10/21/2016   Procedure: Left Heart Cath and Coronary Angiography;  Surgeon: Adrian Prows, MD;  Location: [place] [facility];  Service: Cardiovascular;  Laterality: N/A;     Family History  Problem Relation Age of Onset  . Heart attack Father      Social History   Social History  . Marital status: Single    Spouse name: N/A  . Number of children: N/A  . Years of education: N/A   Occupational History  . Not on file.   Social History Main Topics  . Smoking status: Current Every Day Smoker    Packs/day: 0.50    Types: Cigarettes  . Smokeless tobacco: Never Used  . Alcohol use Yes  . Drug use: No  . Sexual activity: Not Currently   Other Topics Concern  . Not on file   Social History Narrative  . No narrative on file     BP 111/70   Pulse 67   Ht 5\' 3"  (1.6 m)   Wt 163 lb (73.9 kg)   BMI 28.87 kg/m   Physical Exam:  Well appearing young woman, NAD HEENT: Unremarkable Neck:  No JVD, no thyromegally Lymphatics:  No  adenopathy Back:  No CVA tenderness Lungs:  Clear with no wheezes, life vest is in place HEART:  Regular rate rhythm, no murmurs, no rubs, no clicks Abd:  soft, positive bowel sounds, no organomegally, no rebound, no guarding Ext:  2 plus pulses, no edema, no cyanosis, no clubbing Skin:  No rashes no nodules Neuro:  CN II through XII intact, motor grossly intact  EKG - nsr  Assess/Plan: 1. VF arrest - she is wearing a life vest. I have discussed the treatment options with the patient including the risks/benefits/goals/expectations of ICD implant and she wishes to proceed. We discussed the treatment options in detail. Because of her very young age, my sense is that a SICD would be preferable.  2. Cardiomyopathy - initially after her arrest her EF was down but subsequent evaluation was normal. I wonder if she had acute myocarditis that resolved.  Mikle Bosworth.D.

## 2017-01-13 DIAGNOSIS — F17210 Nicotine dependence, cigarettes, uncomplicated: Secondary | ICD-10-CM | POA: Diagnosis not present

## 2017-01-13 DIAGNOSIS — I462 Cardiac arrest due to underlying cardiac condition: Secondary | ICD-10-CM | POA: Diagnosis not present

## 2017-01-13 DIAGNOSIS — I4901 Ventricular fibrillation: Secondary | ICD-10-CM | POA: Diagnosis not present

## 2017-01-13 DIAGNOSIS — Z8249 Family history of ischemic heart disease and other diseases of the circulatory system: Secondary | ICD-10-CM | POA: Diagnosis not present

## 2017-01-13 DIAGNOSIS — I469 Cardiac arrest, cause unspecified: Secondary | ICD-10-CM

## 2017-01-13 DIAGNOSIS — I429 Cardiomyopathy, unspecified: Secondary | ICD-10-CM | POA: Diagnosis not present

## 2017-01-13 DIAGNOSIS — R413 Other amnesia: Secondary | ICD-10-CM | POA: Diagnosis not present

## 2017-01-13 NOTE — Anesthesia Postprocedure Evaluation (Addendum)
Anesthesia Post Note  Patient: Rachel Duncan  Procedure(s) Performed: Procedure(s) (LRB): SubQ ICD Implant (N/A)  Patient location during evaluation: PACU Anesthesia Type: General Level of consciousness: awake and alert Pain management: pain level controlled Vital Signs Assessment: post-procedure vital signs reviewed and stable Respiratory status: spontaneous breathing, nonlabored ventilation, respiratory function stable and patient connected to nasal cannula oxygen Cardiovascular status: blood pressure returned to baseline and stable Postop Assessment: no signs of nausea or vomiting Anesthetic complications: no       Last Vitals:  Vitals:   01/13/17 0600 01/13/17 0747  BP: (!) 143/99 (!) 143/91  Pulse:  60  Resp: 18 18  Temp:  36.8 C    Last Pain:  Vitals:   01/13/17 0747  TempSrc: Oral  PainSc:                  Demico Ploch

## 2017-01-13 NOTE — Discharge Instructions (Addendum)
° ° °  Supplemental Discharge Instructions for  Pacemaker/Defibrillator Patients   WOUND CARE - Keep the wound area clean and dry.  Do not get this area wet for one week. No showers for one week; you may shower on  01/19/2017   . - The tape/steri-strips on your wound will fall off; do not pull them off.  No bandage is needed on the site.  DO  NOT apply any creams, oils, or ointments to the wound area. - If you notice any drainage or discharge from the wound, any swelling or bruising at the site, or you develop a fever > 101? F after you are discharged home, call the office at once.  Special Instructions - You are still able to use cellular telephones; use the ear opposite the side where you have your pacemaker/defibrillator.  Avoid carrying your cellular phone near your device. - When traveling through airports, show security personnel your identification card to avoid being screened in the metal detectors.  Ask the security personnel to use the hand wand. - Avoid arc welding equipment, MRI testing (magnetic resonance imaging), TENS units (transcutaneous nerve stimulators).  Call the office for questions about other devices. - Avoid electrical appliances that are in poor condition or are not properly grounded. - Microwave ovens are safe to be near or to operate.  Additional information for defibrillator patients should your device go off: - If your device goes off ONCE and you feel fine afterward, notify the device clinic nurses. - If your device goes off ONCE and you do not feel well afterward, call 911. - If your device goes off TWICE, call 911. - If your device goes off THREE times in one day, call 911.  DO NOT DRIVE YOURSELF OR A FAMILY MEMBER WITH A DEFIBRILLATOR TO THE HOSPITAL--CALL 911.

## 2017-01-13 NOTE — Discharge Summary (Signed)
ELECTROPHYSIOLOGY PROCEDURE DISCHARGE SUMMARY    Patient ID: Rachel Duncan,  MRN: [id number], DOB/AGE: 34-May-1982 34 y.o.  Admit date: 01/12/2017 Discharge date: 01/13/2017  Primary Cardiologist: Dr. Einar Gip Electrophysiologist: Dr. Lovena Le  Primary Discharge Diagnosis:  Cardiac Arrest Ventricular Fibrillation  No Known Allergies   Procedures This Admission:  1.  Implantation of [place] subcutaneous ICD, serial [id number] by Dr Lovena Le.  There were no immediate post procedure complications.  EP Procedure Note  Preoperative diagnosis: VF cardiac arrest  Postoperative diagnosis: VF cardiac arrest, s/p ICD insertion  Procedure performed: S-ICD insertion  Description of the procedure: After informed consent was obtained, the patient was taken to the EP lab in the fasting state. After the usual preparation draping, the anesthesia service was utilized to provide general anesthesia for the patient. Lidocaine was infiltrated subcutaneously throughout the chest along the suprasternal and subxiphoid regions as well as the left lateral midline. A 9 cm incision was carried out in the left lateral midline and electrocautery was utilized to dissect down to the fascia. A 2 cm incision was made in the subxiphoid area as well as the suprasternal area transversely. The tunneling tool was then utilized to probe the subcutaneous lead, serial #[id number] from the left lateral pocket to the subxiphoid pocket and ultimately up to the suprasternal pocket. The lead was secured to the fascia with silk suture. The [place] subcutaneous ICD, serial [phone] [id number] was connected to the subcutaneous lead and the generator was secured with silk suture. The pockets were irrigated with antibiotic irrigation. 2-0 Vicryl was utilized to close the first layer. Defibrillation threshold testing was carried out.  Initially a 10 J test shock was delivered which demonstrated the shocking impedance of 53 ohms. Next  ventricular fibrillation was induced with a T-wave shock. The device was able to successfully diagnose ventricular fibrillation, charge up and defibrillate the patient was 65 J of energy restoring sinus rhythm. The duration of ventricular fibrillation was 18 seconds. No additional defibrillation threshold testing was carried out and benzoin and Steri-Strips her pain on the skin and the patient returned to the recovery area for discontinuation of general anesthesia.  Complications: There were no immediate procedure complications   Brief HPI: Rachel Duncan is a 36 y.o. female was referred to electrophysiology in the outpatient setting for consideration of ICD implantation.  Past medical history includes VF arrest occurring in 10/2016 The patient has persistent LV dysfunction despite guideline directed therapy.  Risks, benefits, and alternatives to ICD implantation were reviewed with the patient who wished to proceed.   Hospital Course:  The patient was admitted and underwent implantation of a subcutaneous ICD with details as outlined above. She was monitored on telemetry overnight which demonstrated NSR with HR in 60's -70's and occasional episodes of ventricular bigeminy. Left chest was without hematoma or ecchymosis.  The device was interrogated and found to be functioning normally. Wound care, arm mobility, and restrictions were reviewed with the patient. The patient was examined and considered stable for discharge to home.   The patient's discharge medications include a beta blocker (Lopressor 25mg  BID).   Physical Exam: Vitals:   01/13/17 0252 01/13/17 0400 01/13/17 0600 01/13/17 0747  BP: 127/71 (!) 133/96 (!) 143/99 (!) 143/91  Pulse: 74   60  Resp: 19 19 18 18   Temp: 97 F (36.1 C)   98.3 F (36.8 C)  TempSrc: Axillary   Oral  SpO2: 97% 100% 100% 96%  Weight: 172 lb 9.9 oz (78.3 kg)  Height:        GEN- The patient is well appearing, alert and oriented x 3 today.   HEENT:  normocephalic, atraumatic; sclera clear, conjunctiva pink; hearing intact; oropharynx clear; neck supple, no JVP Lymph- no cervical lymphadenopathy Lungs- Clear to ausculation bilaterally, normal work of breathing.  No wheezes, rales, rhonchi Heart- Regular rate and rhythm, no murmurs, rubs or gallops, PMI not laterally displaced GI- soft, non-tender, non-distended, bowel sounds present, no hepatosplenomegaly Extremities- no clubbing, cyanosis, or edema; DP/PT/radial pulses 2+ bilaterally MS- no significant deformity or atrophy Skin- warm and dry, no rash or lesion, left chest without hematoma/ecchymosis Psych- euthymic mood, full affect Neuro- strength and sensation are intact   Labs:   Lab Results  Component Value Date   WBC 5.1 01/02/2017   HGB 12.0 12/11/2016   HCT 37.8 01/02/2017   MCV 89 01/02/2017   PLT 293 01/02/2017   No results for input(s): NA, K, CL, CO2, BUN, CREATININE, CALCIUM, PROT, BILITOT, ALKPHOS, ALT, AST, GLUCOSE in the last 168 hours.  Invalid input(s): LABALBU  Discharge Medications:  Allergies as of 01/13/2017   No Known Allergies     Medication List    TAKE these medications   ibuprofen 200 MG tablet Commonly known as:  ADVIL,MOTRIN Take 200-400 mg by mouth every 6 (six) hours as needed for headache or moderate pain.   metoprolol 50 MG tablet Commonly known as:  LOPRESSOR Take 25 mg by mouth 2 (two) times daily. What changed:  Another medication with the same name was removed. Continue taking this medication, and follow the directions you see here.       Disposition:   Follow-up Information    [place] [facility] Follow up on 01/24/2017.   Specialty:  Cardiology Why:  Wound Check on 01/24/2017 at 11:30AM.  Contact information: [address] [place] [phone]          Duration of Discharge Encounter: Greater than 30 minutes including physician time.  Signed, Bernerd Pho,  PA-C 01/13/2017 8:16 AM  EP Attending  Patient seen and examined. Agree with above. She is stable after S-ICD insertion. Pain is well controlled. Her device has been interogated and is working correctly. She is stable for DC home with usual precautions and followup.  Cristopher Peru, M.D.

## 2017-01-15 ENCOUNTER — Encounter (HOSPITAL_COMMUNITY): Payer: Self-pay | Admitting: Internal Medicine

## 2017-01-15 NOTE — H&P (Signed)
ICD Criteria  Current LVEF:55%. Within 12 months prior to implant: Yes   Heart failure history: Yes, Class I  Cardiomyopathy history: No.  Atrial Fibrillation/Atrial Flutter: No.  Ventricular tachycardia history: No.  Cardiac arrest history: Yes, Ventricular Fibrillation.  History of syndromes with risk of sudden death: No.  Previous ICD: No.  Current ICD indication: Secondary  PPM indication: No.   Class I or II Bradycardia indication present: No  Beta Blocker therapy for 3 or more months: Yes, prescribed.   Ace Inhibitor/ARB therapy for 3 or more months: No, medical reason.

## 2017-01-16 ENCOUNTER — Other Ambulatory Visit (HOSPITAL_COMMUNITY)
Admission: RE | Admit: 2017-01-16 | Discharge: 2017-01-16 | Disposition: A | Discharge status: Home / Self Care | Payer: Medicare Other | Source: Ambulatory Visit | Attending: Internal Medicine | Admitting: Internal Medicine

## 2017-01-16 ENCOUNTER — Ambulatory Visit: Payer: Medicare Other | Attending: Internal Medicine | Admitting: Internal Medicine

## 2017-01-16 ENCOUNTER — Encounter: Payer: Self-pay | Admitting: Internal Medicine

## 2017-01-16 VITALS — BP 135/85 | HR 48 | Temp 98.1°F | Resp 16 | Wt 165.2 lb

## 2017-01-16 DIAGNOSIS — Z1151 Encounter for screening for human papillomavirus (HPV): Secondary | ICD-10-CM | POA: Insufficient documentation

## 2017-01-16 DIAGNOSIS — Z01419 Encounter for gynecological examination (general) (routine) without abnormal findings: Secondary | ICD-10-CM | POA: Insufficient documentation

## 2017-01-16 DIAGNOSIS — Z124 Encounter for screening for malignant neoplasm of cervix: Secondary | ICD-10-CM | POA: Diagnosis not present

## 2017-01-16 DIAGNOSIS — Z01411 Encounter for gynecological examination (general) (routine) with abnormal findings: Secondary | ICD-10-CM | POA: Insufficient documentation

## 2017-01-16 DIAGNOSIS — Z1272 Encounter for screening for malignant neoplasm of vagina: Secondary | ICD-10-CM | POA: Insufficient documentation

## 2017-01-16 DIAGNOSIS — Z95810 Presence of automatic (implantable) cardiac defibrillator: Secondary | ICD-10-CM | POA: Insufficient documentation

## 2017-01-16 DIAGNOSIS — Z8674 Personal history of sudden cardiac arrest: Secondary | ICD-10-CM | POA: Diagnosis not present

## 2017-01-16 NOTE — Progress Notes (Signed)
Rachel Duncan, is a 36 y.o. female  [id number]  [id number]  DOB - [birth year]  Chief Complaint  Patient presents with  . Gynecologic Exam        Subjective:   Rachel Duncan is a 36 y.o. female here today for a follow up visit and papsmear. Recent Implantation of [employer] subcutaneous ICD, serial [id number] by Dr Lovena Le on 01/12/17 for vf cardiac arrest. She is doing well, no c/o, some mild tenderness but improving from surgical site.  Her young dgt 34yr old in exam room w/ her today.  Recent menses, regular bleeding. Wants to check for stds as well. Denies any abnml nipple/vag discurage.  Patient has No headache, No chest pain, No abdominal pain - No Nausea, No new weakness tingling or numbness, No Cough - SOB.  No problems updated.  ALLERGIES: No Known Allergies  PAST MEDICAL HISTORY: No past medical history on file.  MEDICATIONS AT HOME: Prior to Admission medications   Medication Sig Start Date End Date Taking? Authorizing Provider  ibuprofen (ADVIL,MOTRIN) 200 MG tablet Take 200-400 mg by mouth every 6 (six) hours as needed for headache or moderate pain.    Historical Provider, MD  metoprolol (LOPRESSOR) 50 MG tablet Take 25 mg by mouth 2 (two) times daily.  12/26/16   Historical Provider, MD     Objective:   Vitals:   01/16/17 1616  BP: 135/85  Pulse: (!) 48  Resp: 16  Temp: 98.1 F (36.7 C)  TempSrc: Oral  SpO2: 97%  Weight: 165 lb 3.2 oz (74.9 kg)    Exam General appearance : Awake, alert, not in any distress. Speech Clear. Not toxic looking, pleasant HEENT: Atraumatic and Normocephalic, pupils equally reactive to light. Neck: supple, no JVD. Marland Kitchen  Chest:Good air entry bilaterally, no added sounds. aicd left lat of breast, and 2 midsternal surg sites c/d/i, no signs of erythema.  Mild edema at acid site, but nttp.  No drainage noted. Breast /axilla: bilat nml appearance, not dippling noted. No palpable masses/nodules/nipple discharge noted on  exam  CVS: S1 S2 regular, no murmurs/gallups or rubs. Abdomen: Bowel sounds active, Non tender and not distended with no gaurding, rigidity or rebound. Pelvic Exam: Cervix normal in appearance, external genitalia normal, no adnexal masses or tenderness, no cervical motion tenderness, rectovaginal septum normal, uterus normal size, shape, and consistency and vagina normal with clear, white discharge   Extremities: B/L Lower Ext shows no edema, both legs are warm to touch Neurology: Awake alert, and oriented X 3, CN II-XII grossly intact, Non focal Skin:No Rash  Data Review Lab Results  Component Value Date   HGBA1C 5.5 12/11/2016    Depression screen [facility] 2/9 01/16/2017 12/11/2016 11/08/2016  Decreased Interest 0 0 1  Down, Depressed, Hopeless 0 0 2  PHQ - 2 Score 0 0 3  Altered sleeping 1 1 0  Tired, decreased energy 0 0 1  Change in appetite 0 0 0  Feeling bad or failure about yourself  0 0 0  Trouble concentrating 0 0 0  Moving slowly or fidgety/restless 0 0 1  Suicidal thoughts 0 0 0  PHQ-9 Score 1 1 5       Assessment & Plan   1. Cervical cancer screening - Cytology - PAP  2. Recent icd placement for vf arrest. Implantation of [employer] subcutaneous ICD, serial [id number] by Dr Lovena Le on 01/12/17  - surg incisions c/d/i, f/u w/ cards.    Patient have been counseled extensively about nutrition and  exercise  Return in about 3 months (around 04/15/2017), or if symptoms worsen or fail to improve.  The patient was given clear instructions to go to ER or return to medical center if symptoms don't improve, worsen or new problems develop. The patient verbalized understanding. The patient was told to call to get lab results if they haven't heard anything in the next week.   This note has been created with Surveyor, quantity. Any transcriptional errors are unintentional.   Maren Reamer, MD, [facility] [place], [place]   01/16/2017, 4:33 PM

## 2017-01-18 ENCOUNTER — Telehealth: Payer: Self-pay | Admitting: Internal Medicine

## 2017-01-18 LAB — CERVICOVAGINAL ANCILLARY ONLY
Chlamydia: NEGATIVE
Neisseria Gonorrhea: NEGATIVE
Wet Prep (BD Affirm): POSITIVE — AB

## 2017-01-18 NOTE — Telephone Encounter (Signed)
Called, spoke with pt. Pt stated HR was 34 last night for 20-30 min. Pt stated she had just taken Metoprolol 1 hour prior. Pt denied SOB or CP. Today pt has been feeling fine. HR now 64. Informed to HOLD Metoprolol if HR below 50. I will TT Dr. Lovena Le tomorrow and call with further recommendations. Pt verbalized understanding.

## 2017-01-18 NOTE — Telephone Encounter (Signed)
New Message  Pt call requesting to speak with RN. Pt states her heart rate was 27 last night. Pt would like to know more information about the pacemaker. Pt is a bit confused. Please call back to discuss

## 2017-01-18 NOTE — Telephone Encounter (Signed)
New Message     Her heart rate went down to 28, how is the defibulator suppose to work?

## 2017-01-19 NOTE — Telephone Encounter (Signed)
Received incoming call from pt. Informed pt that if HR is decreased on pulse ox it is likely due to bigeminal PVCs. Dr. Lovena Le recommends pt to continue Metoprolol as prescribed. Informed to call our office if you develop any symptoms - SOB, CP, dizziness. Pt verbalized understanding.

## 2017-01-22 ENCOUNTER — Other Ambulatory Visit: Payer: Self-pay | Admitting: Internal Medicine

## 2017-01-22 ENCOUNTER — Telehealth: Payer: Self-pay

## 2017-01-22 LAB — CYTOLOGY - PAP
Diagnosis: NEGATIVE
HPV: NOT DETECTED

## 2017-01-22 MED ORDER — METRONIDAZOLE 500 MG PO TABS: 500.0000 mg | ORAL_TABLET | Freq: Two times a day (BID) | ORAL | 0 refills | Status: DC

## 2017-01-23 LAB — CERVICOVAGINAL ANCILLARY ONLY: HERPES (WINDOWPATH): NEGATIVE

## 2017-01-23 NOTE — Telephone Encounter (Signed)
Contacted pt to go over pap results pt is aware of results and is aware of rx sent to pharmacy. Pt states she doesn't have any questions or concerns

## 2017-01-24 ENCOUNTER — Ambulatory Visit (INDEPENDENT_AMBULATORY_CARE_PROVIDER_SITE_OTHER): Payer: Medicare Other | Admitting: *Deleted

## 2017-01-24 DIAGNOSIS — I4901 Ventricular fibrillation: Secondary | ICD-10-CM | POA: Diagnosis not present

## 2017-01-24 LAB — CUP PACEART INCLINIC DEVICE CHECK
Date Time Interrogation Session: 20180307165726
Implantable Lead Implant Date: 20180223
Implantable Lead Location: 753858
Implantable Lead Model: 3401
MDC IDC LEAD SERIAL: 113050
MDC IDC PG IMPLANT DT: 20180223
MDC IDC PG SERIAL: 221739

## 2017-01-24 NOTE — Progress Notes (Signed)
Wound check appointment. Steri-strips removed. Wound without redness or edema. Incision edges approximated, wound well healed. Subcutaneous ICD check in clinic. 0 untreated episodes; 0 treated episodes; 0 shocks delivered. Electrode impedance status okay. No programming changes. Remaining longevity to ERI 100%. 

## 2017-02-01 DIAGNOSIS — Z95810 Presence of automatic (implantable) cardiac defibrillator: Secondary | ICD-10-CM | POA: Diagnosis not present

## 2017-02-01 DIAGNOSIS — I428 Other cardiomyopathies: Secondary | ICD-10-CM | POA: Diagnosis not present

## 2017-02-01 DIAGNOSIS — Z8674 Personal history of sudden cardiac arrest: Secondary | ICD-10-CM | POA: Diagnosis not present

## 2017-02-12 ENCOUNTER — Ambulatory Visit: Payer: Medicare Other | Attending: Internal Medicine | Admitting: Internal Medicine

## 2017-02-12 DIAGNOSIS — Z95810 Presence of automatic (implantable) cardiac defibrillator: Secondary | ICD-10-CM | POA: Diagnosis not present

## 2017-02-14 DIAGNOSIS — Z8674 Personal history of sudden cardiac arrest: Secondary | ICD-10-CM | POA: Diagnosis not present

## 2017-02-14 DIAGNOSIS — Z95810 Presence of automatic (implantable) cardiac defibrillator: Secondary | ICD-10-CM | POA: Diagnosis not present

## 2017-02-14 DIAGNOSIS — I428 Other cardiomyopathies: Secondary | ICD-10-CM | POA: Diagnosis not present

## 2017-02-23 ENCOUNTER — Ambulatory Visit: Payer: Medicare Other | Attending: Family Medicine | Admitting: Family Medicine

## 2017-02-23 ENCOUNTER — Encounter: Payer: Self-pay | Admitting: Family Medicine

## 2017-02-23 VITALS — BP 116/80 | HR 55 | Temp 97.9°F | Ht 63.0 in | Wt 167.0 lb

## 2017-02-23 DIAGNOSIS — Z8674 Personal history of sudden cardiac arrest: Secondary | ICD-10-CM | POA: Diagnosis not present

## 2017-02-23 DIAGNOSIS — Z95810 Presence of automatic (implantable) cardiac defibrillator: Secondary | ICD-10-CM | POA: Diagnosis not present

## 2017-02-23 DIAGNOSIS — Z308 Encounter for other contraceptive management: Secondary | ICD-10-CM | POA: Insufficient documentation

## 2017-02-23 DIAGNOSIS — Z3046 Encounter for surveillance of implantable subdermal contraceptive: Secondary | ICD-10-CM

## 2017-02-23 DIAGNOSIS — I428 Other cardiomyopathies: Secondary | ICD-10-CM | POA: Diagnosis not present

## 2017-02-23 DIAGNOSIS — F17210 Nicotine dependence, cigarettes, uncomplicated: Secondary | ICD-10-CM | POA: Insufficient documentation

## 2017-02-23 NOTE — Patient Instructions (Addendum)
Rachel Duncan was seen today for contraception.  Diagnoses and all orders for this visit:  Nexplanon removal   Remove pressure bandage before bedtime then apply band aid Ice for 20 minutes to reduce bruising Steri strips will come off in wash derma bond skin glue will also come off in wash   Call if you develop redness, swelling or skin changes   Keep routine follow up with Dr. Janne Napoleon   Dr. Adrian Blackwater

## 2017-02-23 NOTE — Progress Notes (Signed)
   Subjective:  Patient ID: Rachel Duncan, female    DOB: [birth year]  Age: 36 y.o. MRN: [id number]  CC: Contraception   HPI Rachel Duncan presents for nexplanon removal. She has gained 40 lbs since placement.   Social History  Substance Use Topics  . Smoking status: Current Every Day Smoker    Packs/day: 0.50    Types: Cigarettes  . Smokeless tobacco: Never Used  . Alcohol use Yes    Outpatient Medications Prior to Visit  Medication Sig Dispense Refill  . ibuprofen (ADVIL,MOTRIN) 200 MG tablet Take 200-400 mg by mouth every 6 (six) hours as needed for headache or moderate pain.    . metoprolol (LOPRESSOR) 50 MG tablet Take 25 mg by mouth 2 (two) times daily.   1  . metroNIDAZOLE (FLAGYL) 500 MG tablet Take 1 tablet (500 mg total) by mouth 2 (two) times daily. (Patient not taking: Reported on 02/23/2017) 14 tablet 0   No facility-administered medications prior to visit.     ROS Review of Systems  Constitutional: Negative for chills and fever.  Eyes: Negative for visual disturbance.  Respiratory: Negative for shortness of breath.   Cardiovascular: Negative for chest pain.  Gastrointestinal: Negative for abdominal pain and blood in stool.  Musculoskeletal: Negative for arthralgias and back pain.  Skin: Negative for rash.  Allergic/Immunologic: Negative for immunocompromised state.  Hematological: Negative for adenopathy. Does not bruise/bleed easily.  Psychiatric/Behavioral: Negative for dysphoric mood and suicidal ideas.    Objective:  BP 116/80   Pulse (!) 55   Temp 97.9 F (36.6 C) (Oral)   Ht 5\' 3"  (1.6 m)   Wt 167 lb (75.8 kg)   BMI 29.58 kg/m   BP/Weight 02/23/2017 01/16/2017 7/67/2094  Systolic BP 709 628 366  Diastolic BP 80 85 91  Wt. (Lbs) 167 165.2 172.62  BMI 29.58 29.26 -   Physical Exam    PROCEDURE NOTE: Nexplanon removal Patient given informed consent, signed copy in the chart.    After obtaining informed consent, the L arm with numbed with 2 %  lidocaine with epi. The left arm was cleaned skin using iodine and alcohol and the Nexplanon was removed under normal fashion. There was some difficulty with removal during due connective tissue capsule formation around the nexplanon. This was tolerated well. Blood loss minimal. Skin closed with dermabond. Pressure bandage applied.    Assessment & Plan:   There are no diagnoses linked to this encounter.  No orders of the defined types were placed in this encounter.   Follow-up: No Follow-up on file.   Boykin Nearing MD

## 2017-03-09 ENCOUNTER — Other Ambulatory Visit: Payer: Self-pay | Admitting: Internal Medicine

## 2017-03-09 DIAGNOSIS — I428 Other cardiomyopathies: Secondary | ICD-10-CM | POA: Diagnosis not present

## 2017-03-09 DIAGNOSIS — Z8674 Personal history of sudden cardiac arrest: Secondary | ICD-10-CM | POA: Diagnosis not present

## 2017-03-09 DIAGNOSIS — Z95810 Presence of automatic (implantable) cardiac defibrillator: Secondary | ICD-10-CM | POA: Diagnosis not present

## 2017-04-05 ENCOUNTER — Encounter: Payer: Self-pay | Admitting: Internal Medicine

## 2017-04-05 DIAGNOSIS — Z8674 Personal history of sudden cardiac arrest: Secondary | ICD-10-CM | POA: Diagnosis not present

## 2017-04-05 DIAGNOSIS — I428 Other cardiomyopathies: Secondary | ICD-10-CM | POA: Diagnosis not present

## 2017-04-05 DIAGNOSIS — Z95810 Presence of automatic (implantable) cardiac defibrillator: Secondary | ICD-10-CM | POA: Diagnosis not present

## 2017-04-18 ENCOUNTER — Encounter: Payer: Self-pay | Admitting: Family Medicine

## 2017-04-18 ENCOUNTER — Ambulatory Visit: Payer: Medicare Other | Attending: Family Medicine | Admitting: Family Medicine

## 2017-04-18 ENCOUNTER — Other Ambulatory Visit (HOSPITAL_COMMUNITY)
Admission: RE | Admit: 2017-04-18 | Discharge: 2017-04-18 | Disposition: A | Discharge status: Home / Self Care | Payer: Medicare Other | Source: Ambulatory Visit | Attending: Family Medicine | Admitting: Family Medicine

## 2017-04-18 VITALS — BP 94/57 | HR 60 | Temp 97.7°F | Wt 175.0 lb

## 2017-04-18 DIAGNOSIS — Z113 Encounter for screening for infections with a predominantly sexual mode of transmission: Secondary | ICD-10-CM | POA: Diagnosis not present

## 2017-04-18 DIAGNOSIS — Z79899 Other long term (current) drug therapy: Secondary | ICD-10-CM | POA: Insufficient documentation

## 2017-04-18 DIAGNOSIS — B3731 Acute candidiasis of vulva and vagina: Secondary | ICD-10-CM

## 2017-04-18 DIAGNOSIS — B373 Candidiasis of vulva and vagina: Secondary | ICD-10-CM | POA: Insufficient documentation

## 2017-04-18 DIAGNOSIS — Z95810 Presence of automatic (implantable) cardiac defibrillator: Secondary | ICD-10-CM | POA: Diagnosis not present

## 2017-04-18 MED ORDER — FLUCONAZOLE 150 MG PO TABS: 150.0000 mg | ORAL_TABLET | Freq: Once | ORAL | 0 refills | Status: AC

## 2017-04-18 NOTE — Patient Instructions (Signed)

## 2017-04-18 NOTE — Addendum Note (Signed)
Addended by: Arnoldo Morale on: 04/18/2017 02:20 PM   Modules accepted: Orders

## 2017-04-18 NOTE — Progress Notes (Signed)
Subjective:  Patient ID: Rachel Duncan, female    DOB: 09-24-1981  Age: 36 y.o. MRN: [id number]  CC: Annual Exam   HPI Rachel Duncan presents is a 36 year old female with a history of recent ICD placement status post V. fib arrest on 12/2016 who presents today for STD screening and to establish care with me. She was previously followed by Dr Janne Napoleon.  She had a Pap smear in 12/2016 which was normal but informs me today that her sexual partner was diagnosed with a "bacterial infection" and she is unsure of the name. She endorses vaginal itching and burning one week ago but has no vaginal symptoms at this time. Denies urinary symptoms, abdominal pain.  Denies chest pains or shortness of breath Last visit to cardiology (Dr Cristopher Peru) was on 02/2017  No past medical history on file.  Past Surgical History:  Procedure Laterality Date  . CARDIAC CATHETERIZATION N/A 10/21/2016   Procedure: Left Heart Cath and Coronary Angiography;  Surgeon: Adrian Prows, MD;  Location: [facility];  Service: Cardiovascular;  Laterality: N/A;  . SUBQ ICD IMPLANT N/A 01/12/2017   Procedure: SubQ ICD Implant;  Surgeon: Evans Lance, MD;  Location: [facility];  Service: Cardiovascular;  Laterality: N/A;    No Known Allergies    Outpatient Medications Prior to Visit  Medication Sig Dispense Refill  . ibuprofen (ADVIL,MOTRIN) 200 MG tablet Take 200-400 mg by mouth every 6 (six) hours as needed for headache or moderate pain.    . metoprolol (LOPRESSOR) 50 MG tablet Take 25 mg by mouth 2 (two) times daily.   1  . metroNIDAZOLE (FLAGYL) 500 MG tablet Take 1 tablet (500 mg total) by mouth 2 (two) times daily. (Patient not taking: Reported on 02/23/2017) 14 tablet 0   No facility-administered medications prior to visit.     ROS Review of Systems  Constitutional: Negative for activity change, appetite change and fatigue.  HENT: Negative for congestion, sinus pressure and sore throat.   Eyes:  Negative for visual disturbance.  Respiratory: Negative for cough, chest tightness, shortness of breath and wheezing.   Cardiovascular: Negative for chest pain and palpitations.  Gastrointestinal: Negative for abdominal distention, abdominal pain and constipation.  Endocrine: Negative for polydipsia.  Genitourinary:       See history of present illness  Musculoskeletal: Negative for arthralgias and back pain.  Skin: Negative for rash.  Neurological: Negative for tremors, light-headedness and numbness.  Hematological: Does not bruise/bleed easily.  Psychiatric/Behavioral: Negative for agitation and behavioral problems.    Objective:  BP (!) 94/57   Pulse 60   Temp 97.7 F (36.5 C) (Oral)   Wt 175 lb (79.4 kg)   SpO2 99%   BMI 31.00 kg/m   BP/Weight 04/18/2017 02/23/2017 7/32/2025  Systolic BP 94 427 062  Diastolic BP 57 80 85  Wt. (Lbs) 175 167 165.2  BMI 31 29.58 29.26      Physical Exam  Constitutional: She is oriented to person, place, and time. She appears well-developed and well-nourished.  Cardiovascular: Normal rate, normal heart sounds and intact distal pulses.   No murmur heard. Pulmonary/Chest: Effort normal and breath sounds normal. She has no wheezes. She has no rales. She exhibits no tenderness.  Abdominal: Soft. Bowel sounds are normal. She exhibits no distension and no mass. There is no tenderness.  Genitourinary:  Genitourinary Comments: Normal external genitalia, thick whitish vaginal discharge.  Musculoskeletal: Normal range of motion.  Neurological: She is alert and oriented  to person, place, and time.     Assessment & Plan:   1. Vaginal candidiasis - Cervicovaginal ancillary only - Wet prep, genital - fluconazole (DIFLUCAN) 150 MG tablet; Take 1 tablet (150 mg total) by mouth once.  Dispense: 1 tablet; Refill: 0   Meds ordered this encounter  Medications  . fluconazole (DIFLUCAN) 150 MG tablet    Sig: Take 1 tablet (150 mg total) by mouth once.     Dispense:  1 tablet    Refill:  0    Follow-up: Return if symptoms worsen or fail to improve.    This note has been created with Surveyor, quantity. Any transcriptional errors are unintentional.    Arnoldo Morale MD

## 2017-04-19 LAB — CERVICOVAGINAL ANCILLARY ONLY
CHLAMYDIA, DNA PROBE: NEGATIVE
Neisseria Gonorrhea: NEGATIVE
Wet Prep (BD Affirm): NEGATIVE

## 2017-06-06 DIAGNOSIS — R011 Cardiac murmur, unspecified: Secondary | ICD-10-CM | POA: Diagnosis not present

## 2017-06-06 DIAGNOSIS — I428 Other cardiomyopathies: Secondary | ICD-10-CM | POA: Diagnosis not present

## 2017-06-06 DIAGNOSIS — Z95810 Presence of automatic (implantable) cardiac defibrillator: Secondary | ICD-10-CM | POA: Diagnosis not present

## 2017-06-06 DIAGNOSIS — Z8674 Personal history of sudden cardiac arrest: Secondary | ICD-10-CM | POA: Diagnosis not present

## 2017-06-07 DIAGNOSIS — Z95810 Presence of automatic (implantable) cardiac defibrillator: Secondary | ICD-10-CM | POA: Diagnosis not present

## 2017-08-07 ENCOUNTER — Telehealth: Payer: Self-pay | Admitting: Family Medicine

## 2017-08-07 NOTE — Telephone Encounter (Signed)
Pt called requesting to speak to nurse. Pt wants to know if it would be safe to take Misoprostol. Pt states she is pregnant but wants to take this medication. Please f/up

## 2017-08-07 NOTE — Telephone Encounter (Signed)
It is not safe for pregnancy. She will need to see her OB.

## 2017-08-07 NOTE — Telephone Encounter (Signed)
Will route to PCP 

## 2017-08-08 NOTE — Telephone Encounter (Signed)
Pt was called and informed that medication was not good for pregnancy. Pt states that she has a appointment for abortion today.

## 2017-09-01 IMAGING — MR MR LUMBAR SPINE W/O CM
4 of 5 series · 18 of 48 positions shown · non-contrast
Comparison: Prior radiographs from 10/28/2016 related a lumbar
puncture.

CLINICAL DATA: Initial evaluation for new onset seizure and setting
of cardiac arrest. Bilateral leg heaviness. Recent lumbar puncture.

EXAM:
MRI LUMBAR SPINE WITHOUT CONTRAST
TECHNIQUE: Multiplanar, multisequence MR imaging of the lumbar spine was
performed. No intravenous contrast was administered.

[Series 4: T2 · sagittal · 4.0mm · 0.55mm/px · 6 of 13 slices shown (1 of 2)]
[im 1/13]
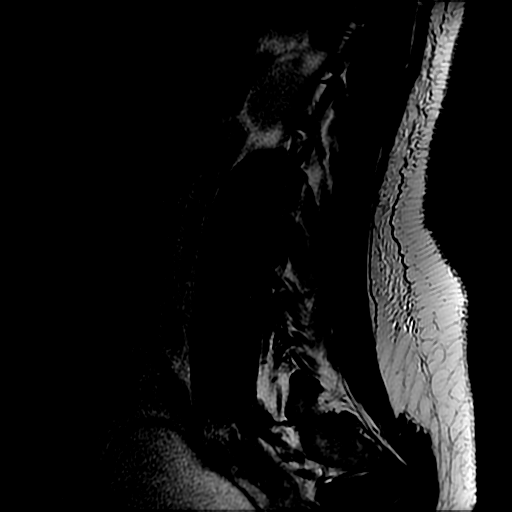
[im 3/13]
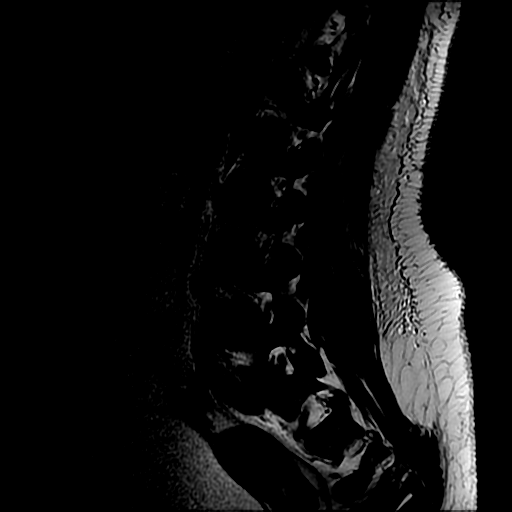
[im 5/13]
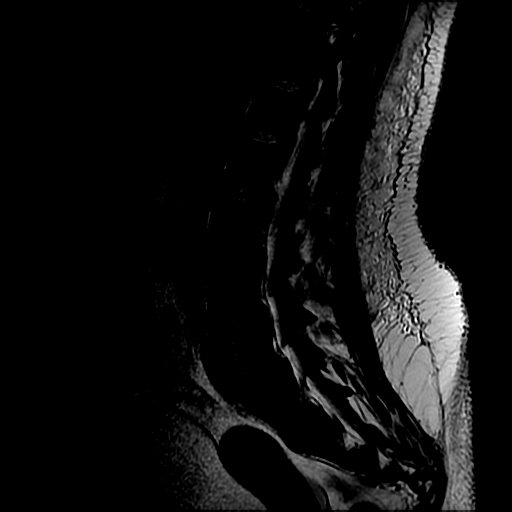
[im 8/13]
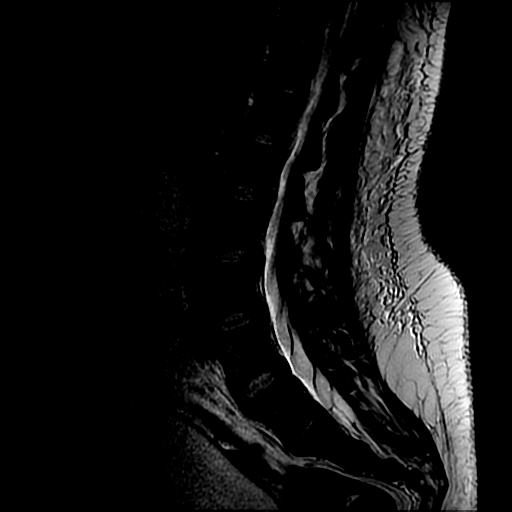
[im 10/13]
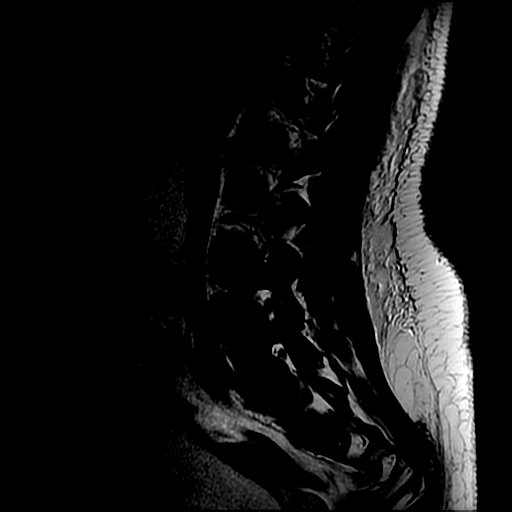
[im 13/13]
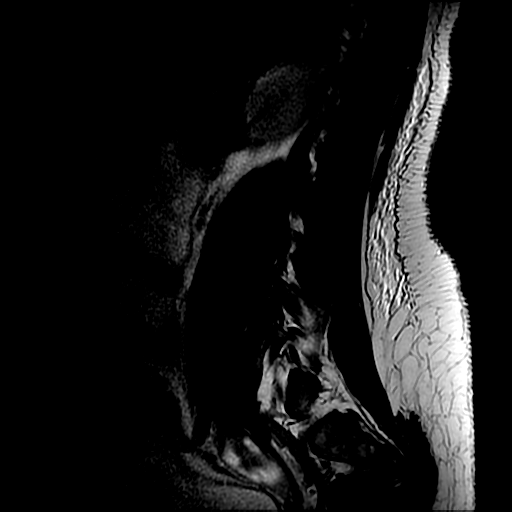

[Series 5: T1 · sagittal · 4.0mm · 0.55mm/px · 3 of 13 slices shown (1 of 2)]
[im 1/13]
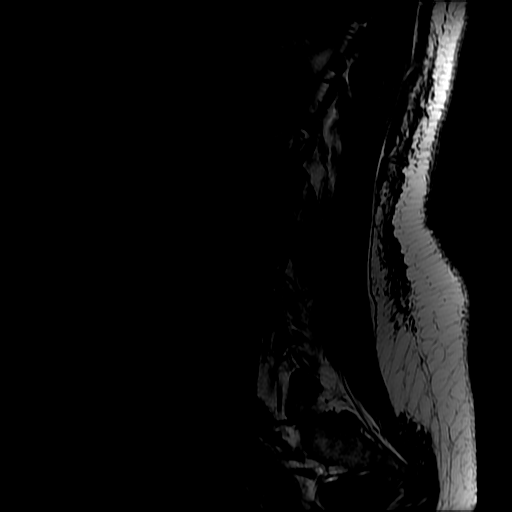
[im 7/13]
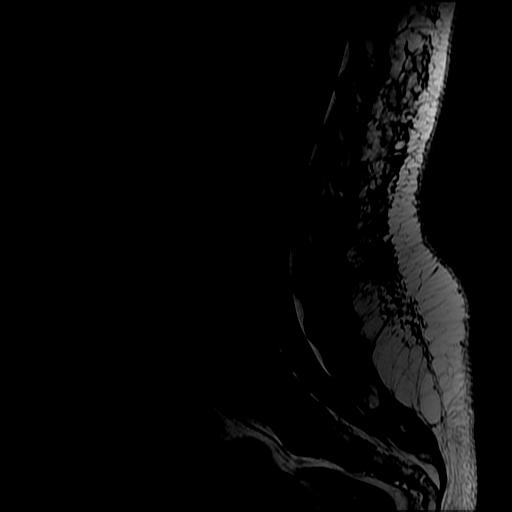
[im 13/13]
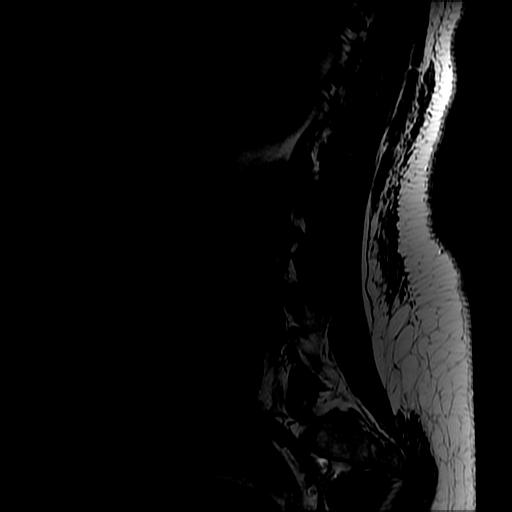

[Series 7: T2 · axial · 4.0mm · 0.39mm/px · z∈[+18,+190]mm · 6 of 39 slices shown (2 of 2)]
[im 3/39]
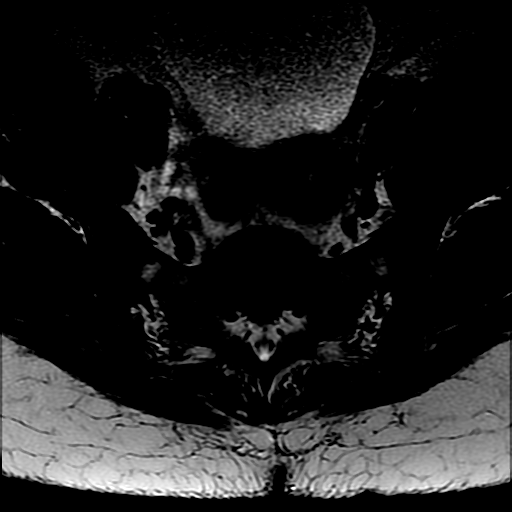
[im 6/39]
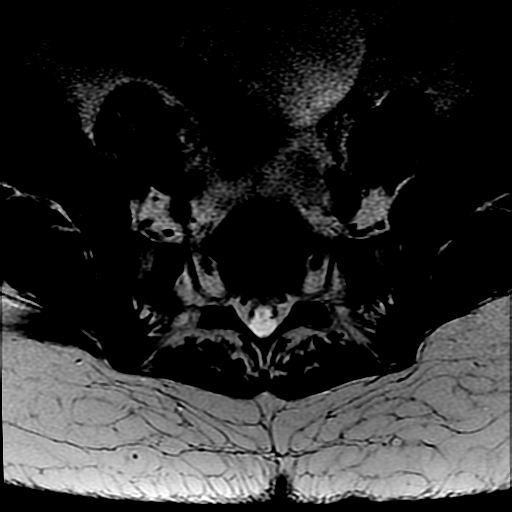
[im 8/39]
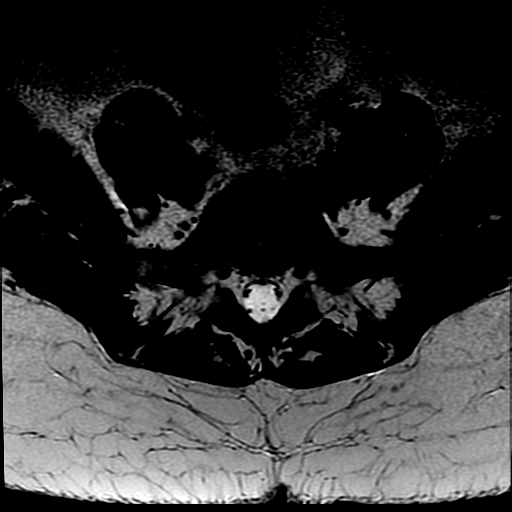
[im 13/39]
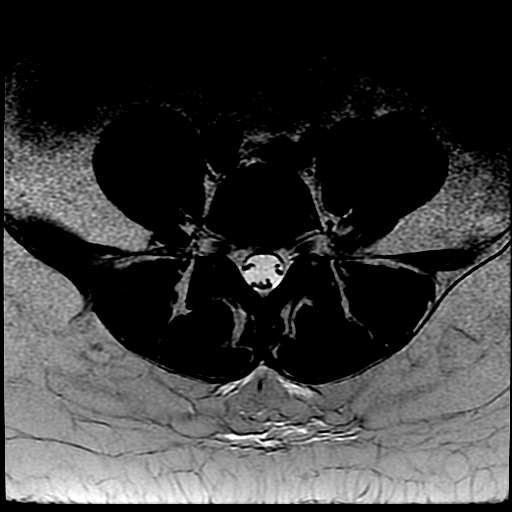
[im 21/39]
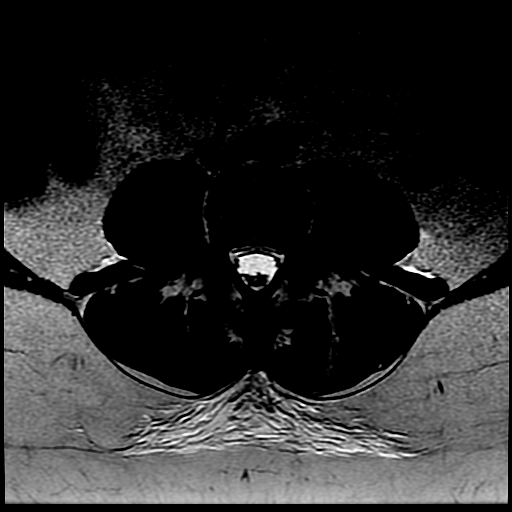
[im 33/39]
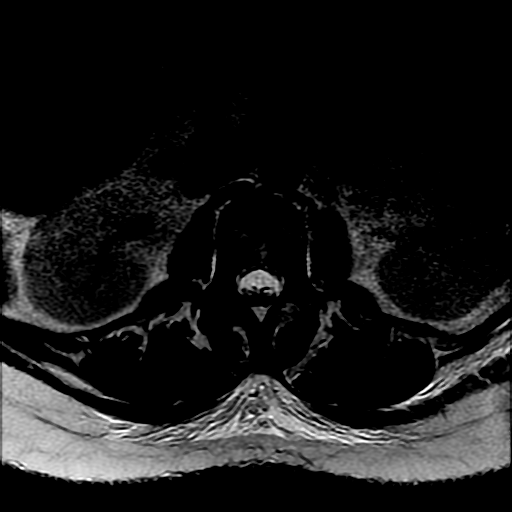

[Series 8: T1 · axial · 4.0mm · 0.39mm/px · z∈[+33,+190]mm · 3 of 39 slices shown (2 of 2)]
[im 6/39]
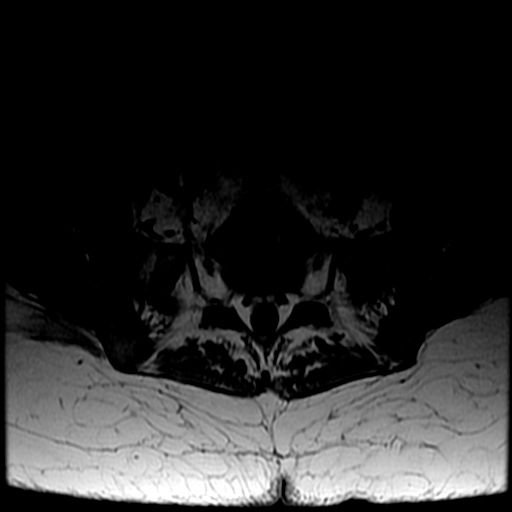
[im 21/39]
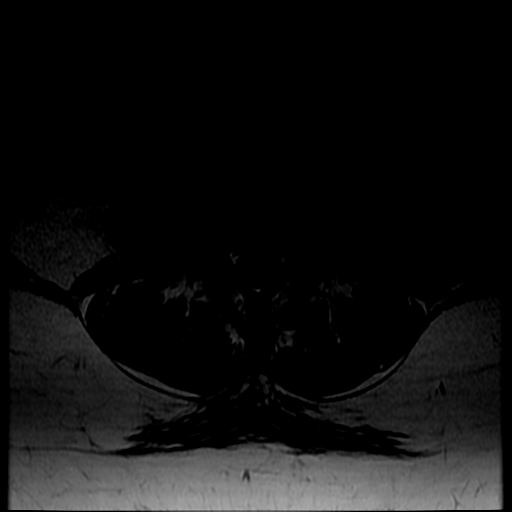
[im 33/39]
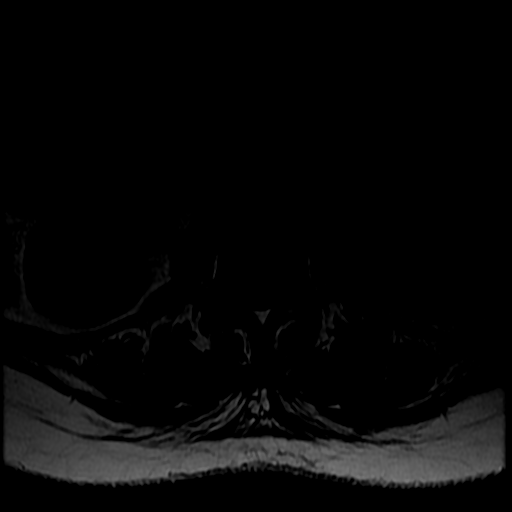

[18 of 48 positions shown; findings below may reference images not displayed]

FINDINGS: Segmentation: Transitional lumbosacral anatomy with sacralization of
the L5 vertebral body. L5-S1 disc is somewhat rudimentary.

Alignment: Vertebral bodies normally aligned with preservation of
the normal lumbar lordosis. No listhesis.

Vertebrae: Vertebral body heights are well maintained. No evidence
for acute or chronic fracture. Signal intensity within the vertebral
body bone marrow within normal limits. No focal osseous lesions. No
abnormal marrow edema.

Conus medullaris: Extends to the L1 level and appears normal. No
epidural collection or other abnormality identified status post
recent lumbar puncture.

Paraspinal and other soft tissues: Mild fluid signal intensity
within the left paraspinous musculature at the L2-3 levels,
suspected to be related to recent lumbar puncture (series 6, image
13). Mild subcutaneous edema. Paraspinous soft tissues are otherwise
within normal limits. Moderate distension noted of the partially
visualized urinary bladder. Remainder of the visualized visceral
structures are within normal limits.

Disc levels:

No significant degenerative changes are identified within the lumbar
spine. No disc bulge or disc protrusion. Intervertebral discs are
well hydrated. No significant facet arthropathy. No canal or
foraminal stenosis.
IMPRESSION: 1. Essentially normal MRI of the lumbar spine. No acute abnormality
identified to explain patient's symptoms.
2. Mild induration within the left posterior paraspinous
musculature, likely related to recent lumbar puncture. No
complication identified status post recent intervention.
3. Moderate distention of the partially visualized urinary bladder.
4. Transitional lumbosacral anatomy with sacralization of the L5
vertebral body.

## 2017-09-02 IMAGING — DX DG CHEST 2V
2 series · 2 of 2 positions shown · non-contrast
Comparison: 10/26/2016

CLINICAL DATA: Respiratory failure. Pt has been having trouble
breathing for 8 days. Smoker of 20+ yrs. Hx of cardiac
catheterization 10/21/2016

EXAM:
CHEST  2 VIEW

[chest pa]
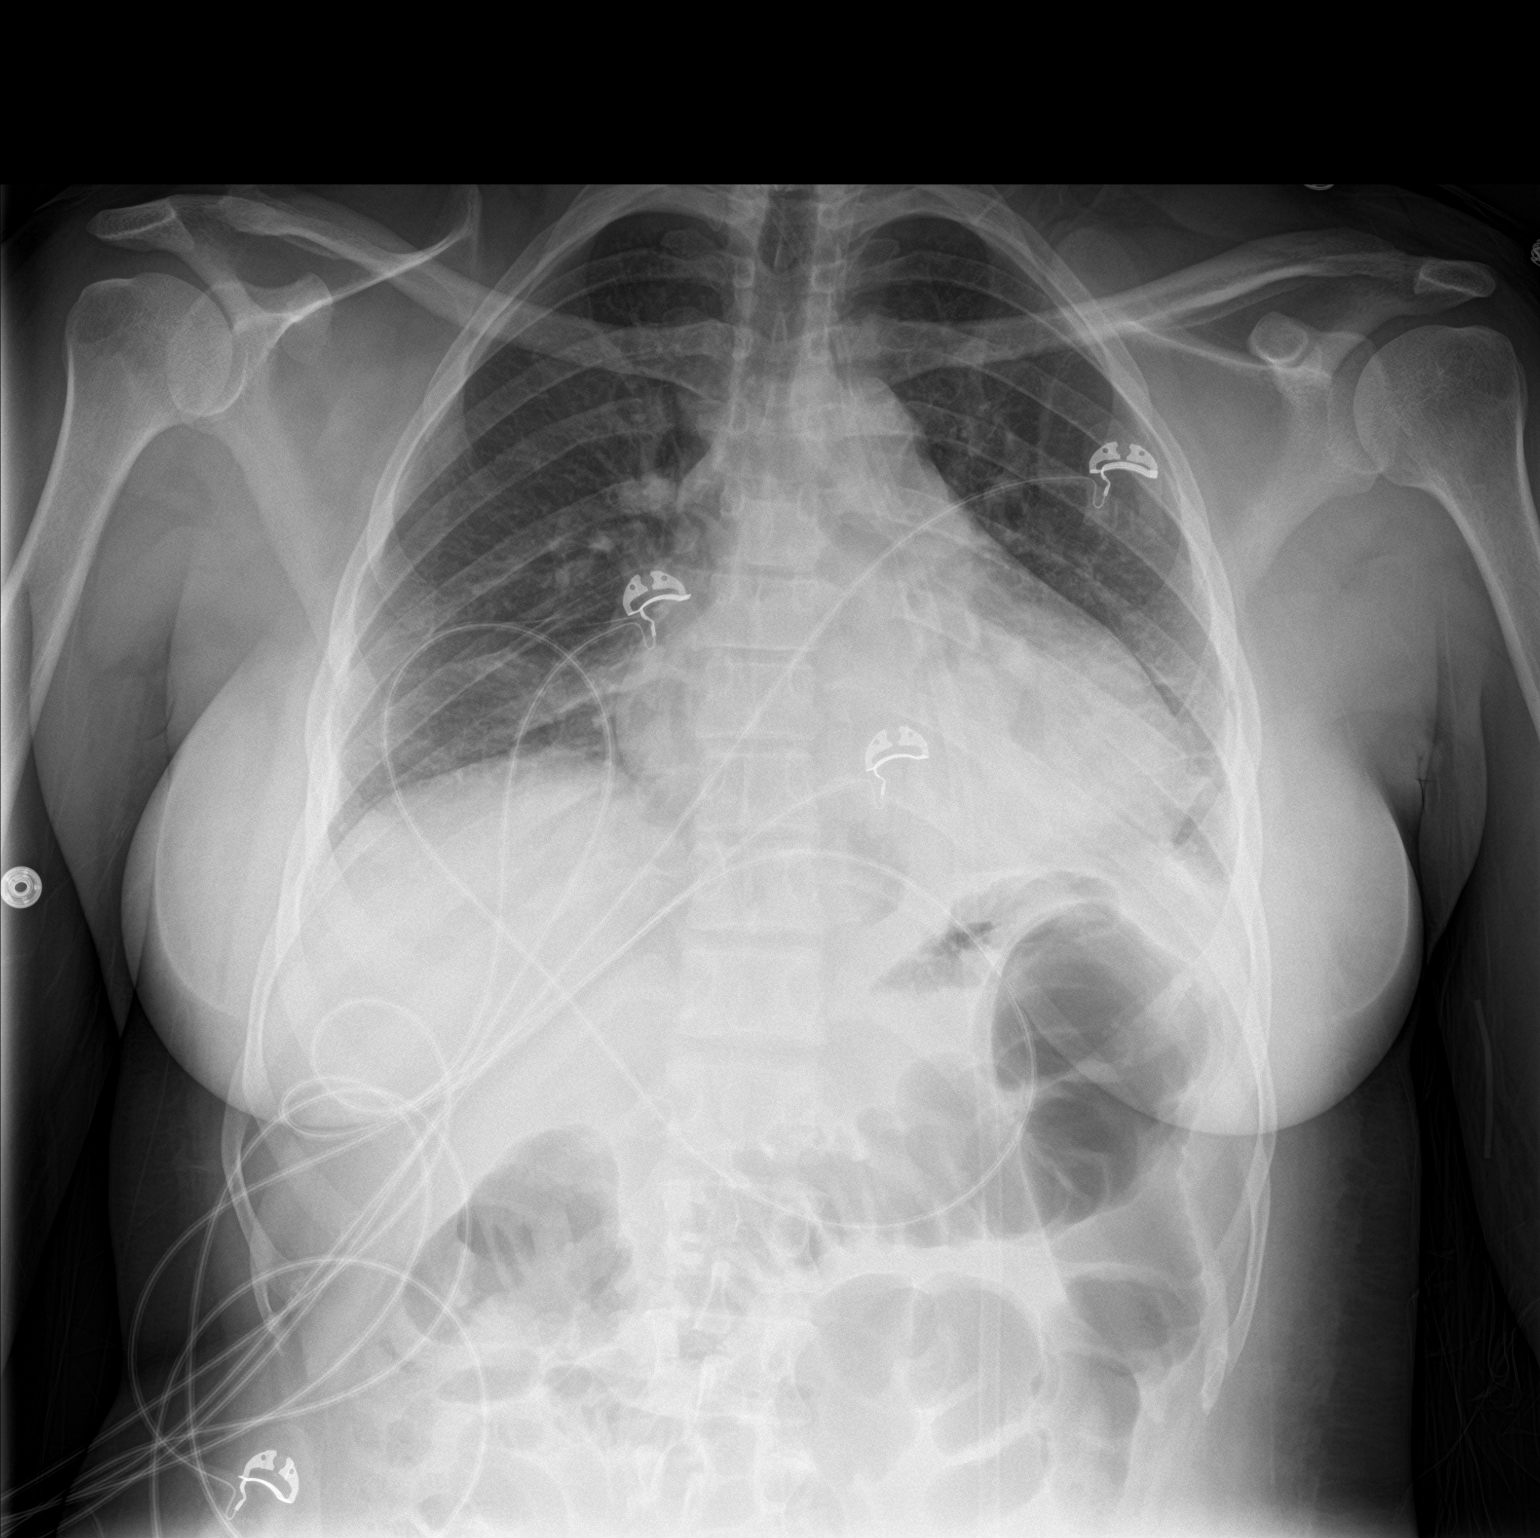

[chest lat]
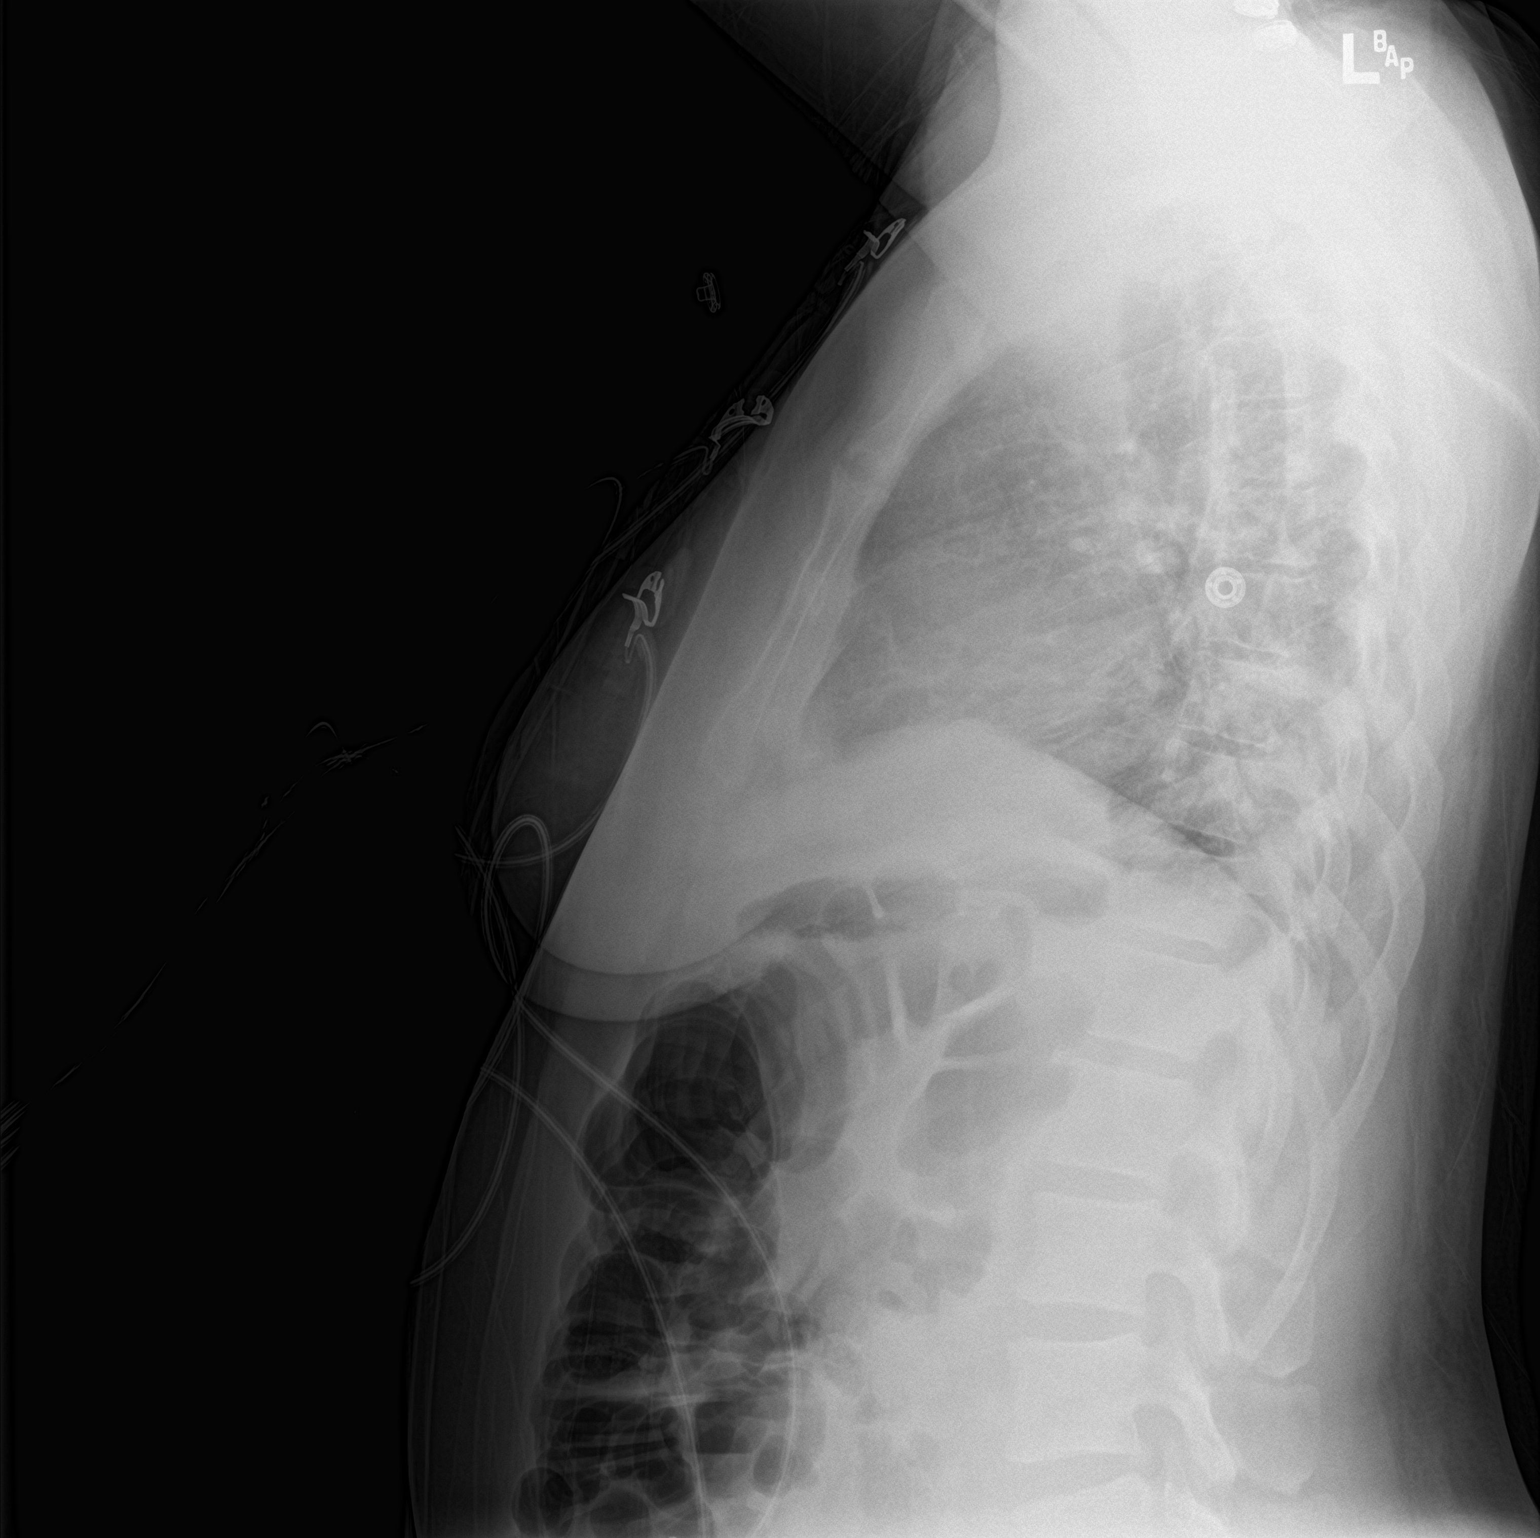

[2 of 2 positions shown; findings below may reference images not displayed]

FINDINGS: Cardiac silhouette is normal in size. No mediastinal or hilar
masses. No evidence of adenopathy.

There is opacity at both lung bases, left greater than right, which
may all be atelectasis. Pneumonia should be considered if there are
consistent clinical symptoms. There is no evidence of pulmonary
edema. There is no pleural effusion or pneumothorax.

Skeletal structures are unremarkable.
IMPRESSION: 1. Left greater than right lung base opacity most likely
atelectasis. Pneumonia is possible. No pulmonary edema.

## 2017-09-07 DIAGNOSIS — I428 Other cardiomyopathies: Secondary | ICD-10-CM | POA: Diagnosis not present

## 2017-09-07 DIAGNOSIS — Z8674 Personal history of sudden cardiac arrest: Secondary | ICD-10-CM | POA: Diagnosis not present

## 2017-09-07 DIAGNOSIS — O0990 Supervision of high risk pregnancy, unspecified, unspecified trimester: Secondary | ICD-10-CM | POA: Diagnosis not present

## 2017-09-07 DIAGNOSIS — Z95810 Presence of automatic (implantable) cardiac defibrillator: Secondary | ICD-10-CM | POA: Diagnosis not present

## 2017-09-07 DIAGNOSIS — Z4502 Encounter for adjustment and management of automatic implantable cardiac defibrillator: Secondary | ICD-10-CM | POA: Diagnosis not present

## 2017-09-12 ENCOUNTER — Ambulatory Visit: Payer: Medicare Other

## 2017-09-13 ENCOUNTER — Ambulatory Visit: Payer: Medicare Other | Attending: Family Medicine | Admitting: Physician Assistant

## 2017-09-13 ENCOUNTER — Encounter: Payer: Self-pay | Admitting: Physician Assistant

## 2017-09-13 VITALS — BP 104/62 | HR 46 | Temp 98.4°F | Resp 18 | Ht 63.0 in | Wt 173.0 lb

## 2017-09-13 DIAGNOSIS — Z8674 Personal history of sudden cardiac arrest: Secondary | ICD-10-CM | POA: Insufficient documentation

## 2017-09-13 DIAGNOSIS — I4901 Ventricular fibrillation: Secondary | ICD-10-CM | POA: Diagnosis not present

## 2017-09-13 DIAGNOSIS — Z79899 Other long term (current) drug therapy: Secondary | ICD-10-CM | POA: Diagnosis not present

## 2017-09-13 DIAGNOSIS — Z95810 Presence of automatic (implantable) cardiac defibrillator: Secondary | ICD-10-CM | POA: Insufficient documentation

## 2017-09-13 DIAGNOSIS — I428 Other cardiomyopathies: Secondary | ICD-10-CM | POA: Diagnosis not present

## 2017-09-13 DIAGNOSIS — N912 Amenorrhea, unspecified: Secondary | ICD-10-CM | POA: Diagnosis not present

## 2017-09-13 DIAGNOSIS — O0990 Supervision of high risk pregnancy, unspecified, unspecified trimester: Secondary | ICD-10-CM | POA: Diagnosis not present

## 2017-09-13 DIAGNOSIS — O99412 Diseases of the circulatory system complicating pregnancy, second trimester: Secondary | ICD-10-CM | POA: Insufficient documentation

## 2017-09-13 DIAGNOSIS — O0992 Supervision of high risk pregnancy, unspecified, second trimester: Secondary | ICD-10-CM

## 2017-09-13 DIAGNOSIS — Z3A25 25 weeks gestation of pregnancy: Secondary | ICD-10-CM | POA: Diagnosis not present

## 2017-09-13 DIAGNOSIS — I493 Ventricular premature depolarization: Secondary | ICD-10-CM | POA: Diagnosis not present

## 2017-09-13 LAB — POCT URINE PREGNANCY: Preg Test, Ur: POSITIVE — AB

## 2017-09-13 NOTE — Patient Instructions (Signed)
Keep appt with Dr. Einar Gip today for U/S of heart and to see physician after test

## 2017-09-13 NOTE — Progress Notes (Signed)
Patient ID: Rachel Duncan, female   DOB: 1981-11-18, 36 y.o.   MRN: [id number]      Veida Spira, is a 36 y.o. female  OZH:[id number]  GEX:[id number]  DOB - 06-Sep-1981  Subjective:  Chief Complaint and HPI: Rachel Duncan is a 36 y.o. female here today for pregnancy.  36 yr old female with history of cardiac arrest and VF followed by cardiology. As best I can piece the history together, the patient had Nexplanon removed 02/23/2017 and never had a period.  She started to think she might be pregnant as early as May but didn't decide to do anything until September when she went to [place] to try and get an abortion.    Was told she was about [redacted] weeks pregnant on september 19th in [place] when she went there to see about getting an abortion and was told that it was too late for the procedure.  She has not had any prenatal care and is not established with an ob/gyn.    She is compliant with metoprolol and entresto which are not preferred medications in pregnancy.  No CP/SOB/dizziness.  Has subcutaneous ICD in place.     LMP spring 2018.  UHCG +today.  Pregnancy uneventful.  No bleeding.  No N/V.    ROS:   Constitutional:  No f/c, No night sweats, No unexplained weight loss. EENT:  No vision changes, No blurry vision, No hearing changes. No mouth, throat, or ear problems.  Respiratory: No cough, No SOB Cardiac: No CP, no palpitations GI:  No abd pain, No N/V/D. GU: No Urinary s/sx Musculoskeletal: No joint pain Neuro: No headache, no dizziness, no motor weakness.  Skin: No rash Endocrine:  No polydipsia. No polyuria.  Psych: Denies SI/HI  No problems updated.  ALLERGIES: No Known Allergies  PAST MEDICAL HISTORY: No past medical history on file.  MEDICATIONS AT HOME: Prior to Admission medications   Medication Sig Start Date End Date Taking? Authorizing Provider  ENTRESTO 97-103 MG TAKE 1 TABLET BY MOUTH BID 03/23/17   [provider]  ibuprofen (ADVIL,MOTRIN) 200 MG tablet  Take 200-400 mg by mouth every 6 (six) hours as needed for headache or moderate pain.    [provider]  metoprolol (LOPRESSOR) 50 MG tablet Take 25 mg by mouth 2 (two) times daily.  12/26/16   [provider]     Objective:  EXAM:   Vitals:   09/13/17 0943  BP: 104/62  Pulse: (!) 46  Resp: 18  Temp: 98.4 F (36.9 C)  TempSrc: Oral  SpO2: 100%  Weight: 173 lb (78.5 kg)  Height: 5\' 3"  (1.6 m)    General appearance : A&OX3. NAD. Non-toxic-appearing HEENT: Atraumatic and Normocephalic.  PERRLA. EOM intact.  Neck: supple, no JVD. No cervical lymphadenopathy. No thyromegaly Chest/Lungs:  Breathing-non-labored, Good air entry bilaterally, breath sounds normal without rales, rhonchi, or wheezing  CVS: irregular rhythm with bradycardia("normal" for her).  No m/g/r Extremities: Bilateral Lower Ext shows no edema, both legs are warm to touch with = pulse throughout Neurology:  CN II-XII grossly intact, Non focal.   Psych:  TP scattered. Impaired J/I. Normal speech. Appropriate eye contact and affect.  Skin:  No Rash  Data Review Lab Results  Component Value Date   HGBA1C 5.5 12/11/2016     Assessment & Plan   1. Amenorrhea +pregnancy/high risk pregnancy without prenatnal care.  She may be ~[redacted] weeks pregnant by now.  Needs to see cardiology ASAP.  We called and scheduled  her an appt for today at 11:30am.  It is imperative she keep this appt.   - POCT urine pregnancy - US OB Detail; Future Urgent referral to OB/GYN for high risk pregnancy  2. Ventricular fibrillation [facility]) To see cardiology today  3.  High risk pregnancy +pregnancy.  Needs to see cardiology ASAP.  We called and scheduled her an appt for today at Dr Irven Shelling office at 11:00 am.  It is imperative she keep this appt.   - POCT urine pregnancy - US OB Detail; Future Urgent referral to OB/GYN for high risk pregnancy   Spent more than 45 mins making appropriate phone calls/appts/f/up for patient  and counseled her at length.      Patient have been counseled extensively about nutrition and exercise  Return in about 2 weeks (around 09/27/2017) for Dr Jarold Song to ensure all f/up needed for +pregnancy and heart condition have been done..  The patient was given clear instructions to go to ER or return to medical center if symptoms don't improve, worsen or new problems develop. The patient verbalized understanding. The patient was told to call to get lab results if they haven't heard anything in the next week.     Freeman Caldron, PA-C [facility] [place], [place]   09/13/2017, 9:55 AM

## 2017-09-14 ENCOUNTER — Encounter: Payer: Medicare Other | Admitting: Internal Medicine

## 2017-09-19 ENCOUNTER — Telehealth: Payer: Self-pay | Admitting: Family Medicine

## 2017-09-19 ENCOUNTER — Ambulatory Visit (HOSPITAL_COMMUNITY): Admission: RE | Admit: 2017-09-19 | Payer: Medicare Other | Source: Ambulatory Visit

## 2017-09-19 ENCOUNTER — Telehealth: Payer: Self-pay | Admitting: Internal Medicine

## 2017-09-19 ENCOUNTER — Other Ambulatory Visit: Payer: Self-pay | Admitting: Physician Assistant

## 2017-09-19 DIAGNOSIS — Z3A14 14 weeks gestation of pregnancy: Secondary | ICD-10-CM

## 2017-09-19 DIAGNOSIS — Z3490 Encounter for supervision of normal pregnancy, unspecified, unspecified trimester: Secondary | ICD-10-CM | POA: Insufficient documentation

## 2017-09-19 NOTE — Telephone Encounter (Signed)
[facility] call to request to change the order from US OB complete + 14 wks to [place] MNS complete + 14 wk, Pt has appt Please follow up

## 2017-09-19 NOTE — Addendum Note (Signed)
Addended by: Trecia Rogers on: 09/19/2017 11:21 AM   Modules accepted: Orders

## 2017-09-19 NOTE — Addendum Note (Signed)
Addended by: Trecia Rogers on: 09/19/2017 11:27 AM   Modules accepted: Orders

## 2017-09-20 ENCOUNTER — Encounter: Payer: Medicare Other | Admitting: Obstetrics and Gynecology

## 2017-09-24 ENCOUNTER — Encounter: Payer: Self-pay | Admitting: Obstetrics & Gynecology

## 2017-09-24 ENCOUNTER — Other Ambulatory Visit (HOSPITAL_COMMUNITY)
Admission: RE | Admit: 2017-09-24 | Discharge: 2017-09-24 | Disposition: A | Discharge status: Home / Self Care | Payer: Medicare Other | Source: Ambulatory Visit | Attending: Obstetrics and Gynecology | Admitting: Obstetrics and Gynecology

## 2017-09-24 ENCOUNTER — Ambulatory Visit (INDEPENDENT_AMBULATORY_CARE_PROVIDER_SITE_OTHER): Payer: Medicare Other | Admitting: Obstetrics & Gynecology

## 2017-09-24 VITALS — BP 112/54 | HR 71 | Wt 172.2 lb

## 2017-09-24 DIAGNOSIS — I469 Cardiac arrest, cause unspecified: Secondary | ICD-10-CM | POA: Diagnosis not present

## 2017-09-24 DIAGNOSIS — R569 Unspecified convulsions: Secondary | ICD-10-CM | POA: Diagnosis not present

## 2017-09-24 DIAGNOSIS — O0990 Supervision of high risk pregnancy, unspecified, unspecified trimester: Secondary | ICD-10-CM | POA: Diagnosis not present

## 2017-09-24 DIAGNOSIS — O0992 Supervision of high risk pregnancy, unspecified, second trimester: Secondary | ICD-10-CM | POA: Diagnosis not present

## 2017-09-24 DIAGNOSIS — Z113 Encounter for screening for infections with a predominantly sexual mode of transmission: Secondary | ICD-10-CM

## 2017-09-24 LAB — POCT URINALYSIS DIP (DEVICE)
Glucose, UA: NEGATIVE mg/dL
HGB URINE DIPSTICK: NEGATIVE
KETONES UR: NEGATIVE mg/dL
Nitrite: NEGATIVE
Protein, ur: 30 mg/dL — AB
Urobilinogen, UA: 1 mg/dL (ref 0.0–1.0)
pH: 6 (ref 5.0–8.0)

## 2017-09-24 MED ORDER — PRENATAL PLUS 27-1 MG PO TABS: 1.0000 | ORAL_TABLET | Freq: Every day | ORAL | 6 refills | Status: DC

## 2017-09-24 NOTE — Patient Instructions (Signed)

## 2017-09-24 NOTE — Progress Notes (Signed)
Subjective:    Rachel Duncan is a [id number] [redacted]w[redacted]d being seen today for her first obstetrical visit.  Her obstetrical history is significant for h/o cardiac arrest and has pacemaker/defibrillator implant. Patient does intend to breast feed. Pregnancy history fully reviewed.  Patient reports no complaints.  Vitals:   09/24/17 1013  BP: (!) 112/54  Pulse: 71  Weight: 172 lb 3.2 oz (78.1 kg)    HISTORY: OB History  Gravida Para Term Preterm AB Living  7 6 6     5   SAB TAB Ectopic Multiple Live Births          6    # Outcome Date GA Lbr Len/2nd Weight Sex Delivery Anes PTL Lv  7 Current           6 Term 2014/02/18 [redacted]w[redacted]d  5 lb 6 oz (2.438 kg) F Vag-Spont None  LIV     Birth Comments: no complications  5 Term 7169 [redacted]w[redacted]d  5 lb 6 oz (2.438 kg) F  None  LIV     Birth Comments: no complications   4 Term 6789 [redacted]w[redacted]d  5 lb 5 oz (2.41 kg) M Vag-Spont None  LIV     Birth Comments: no complications  3 Term 3810 [redacted]w[redacted]d  6 lb 5 oz (2.863 kg) M Vag-Spont None  DEC     Birth Comments: no complications; CRib death 80 month old  2 Term 02/18/2006 [redacted]w[redacted]d  5 lb 5 oz (2.41 kg) F Vag-Spont   LIV     Birth Comments: no complications  1 Term 1751 [redacted]w[redacted]d  5 lb 5 oz (2.41 kg) F Vag-Spont None  LIV     Birth Comments: no complications     Past Medical History:  Diagnosis Date  . Cardiac arrest ([place])   . Encephalopathy   . Seizures ([place])   . Ventricular fibrillation ([place])    History reviewed. No pertinent surgical history. Family History  Problem Relation Age of Onset  . Heart attack Father      Exam    Uterus:   27 cm  Pelvic Exam:    Perineum: No Hemorrhoids   Vulva: normal   Vagina:  normal mucosa   pH:     Cervix: no lesions   Adnexa: normal adnexa   Bony Pelvis: average  System: Breast:  normal appearance, no masses or tenderness   Skin: normal coloration and turgor, no rashes    Neurologic: oriented, normal mood   Extremities: normal strength, tone, and muscle mass   HEENT neck supple with  midline trachea   Mouth/Teeth mucous membranes moist, pharynx normal without lesions and dental hygiene good   Neck supple   Cardiovascular: Irregular rate   Respiratory:  appears well, vitals normal, no respiratory distress, acyanotic, normal RR, chest clear, no wheezing, crepitations, rhonchi, normal symmetric air entry   Abdomen: soft, non-tender; bowel sounds normal; no masses,  no organomegaly   Urinary: urethral meatus normal      Assessment:    Pregnancy: [id number] Patient Active Problem List   Diagnosis Date Noted  . Supervision of high risk pregnancy, antepartum 09/24/2017  . Pregnancy 09/19/2017  . Ventricular fibrillation ([place]) 01/12/2017  . Hypoxemia 10/31/2016  . Seizure ([place])   . Anoxic encephalopathy ([place])   . Acute encephalopathy   . Cardiac arrest ([place]) 10/22/2016  . Acute respiratory failure with hypoxia ([facility])         Plan:     Initial labs drawn. Prenatal vitamins. Problem list reviewed and  updated. Genetic Screening discussed and too late   Ultrasound discussed; fetal survey: ordered.  Follow up in 2 weeks. 50% of 30 min visit spent on counseling and coordination of care.  Needs MFM consult due to cardiac history, she may need to [facility] at [facility] or [facility]   Emeterio Reeve 09/24/2017

## 2017-09-24 NOTE — Progress Notes (Signed)
Here for initial prenatal visit. Unsure of LMP due to nexplanon was removed in April , had a period in [place] of date. Unable to sign up for babyscripts until edc established. Declines flu shot. PMH form completed.

## 2017-09-25 LAB — OBSTETRIC PANEL, INCLUDING HIV
ANTIBODY SCREEN: NEGATIVE
BASOS: 0 %
Basophils Absolute: 0 10*3/uL (ref 0.0–0.2)
EOS (ABSOLUTE): 0 10*3/uL (ref 0.0–0.4)
Eos: 0 %
HEMATOCRIT: 30.7 % — AB (ref 34.0–46.6)
HEMOGLOBIN: 10.3 g/dL — AB (ref 11.1–15.9)
HIV SCREEN 4TH GENERATION: NONREACTIVE
Hepatitis B Surface Ag: NEGATIVE
Immature Grans (Abs): 0 10*3/uL (ref 0.0–0.1)
Immature Granulocytes: 0 %
Lymphocytes Absolute: 1.9 10*3/uL (ref 0.7–3.1)
Lymphs: 33 %
MCH: 30.4 pg (ref 26.6–33.0)
MCHC: 33.6 g/dL (ref 31.5–35.7)
MCV: 91 fL (ref 79–97)
MONOS ABS: 0.5 10*3/uL (ref 0.1–0.9)
Monocytes: 8 %
NEUTROS ABS: 3.4 10*3/uL (ref 1.4–7.0)
Neutrophils: 59 %
Platelets: 227 10*3/uL (ref 150–379)
RBC: 3.39 x10E6/uL — AB (ref 3.77–5.28)
RDW: 13.8 % (ref 12.3–15.4)
RPR Ser Ql: NONREACTIVE
Rh Factor: POSITIVE
Rubella Antibodies, IGG: 3.47 index (ref >0.99)
WBC: 5.8 10*3/uL (ref 3.4–10.8)

## 2017-09-25 LAB — HEMOGLOBINOPATHY EVALUATION
FERRITIN: 63 ng/mL (ref 15–150)
HGB A2 QUANT: 2.4 % (ref 1.8–3.2)
HGB C: 0 %
HGB F QUANT: 0.8 % (ref 0.0–2.0)
HGB S: 0 %
Hgb A: 96.8 % (ref 96.4–98.8)
Hgb Solubility: NEGATIVE
Hgb Variant: 0 %

## 2017-09-25 LAB — GC/CHLAMYDIA PROBE AMP (~~LOC~~) NOT AT ARMC
CHLAMYDIA, DNA PROBE: NEGATIVE
Neisseria Gonorrhea: NEGATIVE

## 2017-09-26 ENCOUNTER — Ambulatory Visit (HOSPITAL_COMMUNITY): Payer: Medicare Other

## 2017-09-27 ENCOUNTER — Other Ambulatory Visit (HOSPITAL_COMMUNITY): Payer: Medicare Other

## 2017-09-27 ENCOUNTER — Other Ambulatory Visit: Payer: Medicare Other

## 2017-09-27 DIAGNOSIS — O0990 Supervision of high risk pregnancy, unspecified, unspecified trimester: Secondary | ICD-10-CM

## 2017-09-27 LAB — CULTURE, OB URINE

## 2017-09-27 LAB — URINE CULTURE, OB REFLEX

## 2017-09-28 DIAGNOSIS — I493 Ventricular premature depolarization: Secondary | ICD-10-CM | POA: Diagnosis not present

## 2017-09-28 DIAGNOSIS — Z8674 Personal history of sudden cardiac arrest: Secondary | ICD-10-CM | POA: Diagnosis not present

## 2017-09-28 DIAGNOSIS — I428 Other cardiomyopathies: Secondary | ICD-10-CM | POA: Diagnosis not present

## 2017-09-28 DIAGNOSIS — O0990 Supervision of high risk pregnancy, unspecified, unspecified trimester: Secondary | ICD-10-CM | POA: Diagnosis not present

## 2017-09-28 LAB — GLUCOSE TOLERANCE, 2 HOURS W/ 1HR
GLUCOSE, 1 HOUR: 154 mg/dL (ref 65–179)
GLUCOSE, FASTING: 77 mg/dL (ref 65–91)
Glucose, 2 hour: 119 mg/dL (ref 65–152)

## 2017-10-01 ENCOUNTER — Encounter: Payer: Self-pay | Admitting: Family Medicine

## 2017-10-01 DIAGNOSIS — Z641 Problems related to multiparity: Secondary | ICD-10-CM | POA: Insufficient documentation

## 2017-10-01 DIAGNOSIS — F1011 Alcohol abuse, in remission: Secondary | ICD-10-CM | POA: Insufficient documentation

## 2017-10-05 ENCOUNTER — Ambulatory Visit (HOSPITAL_COMMUNITY)
Admission: RE | Admit: 2017-10-05 | Discharge: 2017-10-05 | Disposition: A | Discharge status: Home / Self Care | Payer: Medicare Other | Source: Ambulatory Visit | Attending: Internal Medicine | Admitting: Internal Medicine

## 2017-10-05 ENCOUNTER — Other Ambulatory Visit: Payer: Self-pay | Admitting: Internal Medicine

## 2017-10-05 ENCOUNTER — Encounter (HOSPITAL_COMMUNITY): Payer: Self-pay

## 2017-10-05 ENCOUNTER — Other Ambulatory Visit (HOSPITAL_COMMUNITY): Payer: Self-pay | Admitting: *Deleted

## 2017-10-05 DIAGNOSIS — O4103X0 Oligohydramnios, third trimester, not applicable or unspecified: Secondary | ICD-10-CM | POA: Insufficient documentation

## 2017-10-05 DIAGNOSIS — Z363 Encounter for antenatal screening for malformations: Secondary | ICD-10-CM | POA: Insufficient documentation

## 2017-10-05 DIAGNOSIS — O0933 Supervision of pregnancy with insufficient antenatal care, third trimester: Secondary | ICD-10-CM | POA: Diagnosis not present

## 2017-10-05 DIAGNOSIS — Z3A14 14 weeks gestation of pregnancy: Secondary | ICD-10-CM

## 2017-10-05 DIAGNOSIS — O99411 Diseases of the circulatory system complicating pregnancy, first trimester: Secondary | ICD-10-CM | POA: Diagnosis not present

## 2017-10-05 DIAGNOSIS — Z3A28 28 weeks gestation of pregnancy: Secondary | ICD-10-CM

## 2017-10-05 DIAGNOSIS — O365930 Maternal care for other known or suspected poor fetal growth, third trimester, not applicable or unspecified: Secondary | ICD-10-CM | POA: Diagnosis not present

## 2017-10-05 DIAGNOSIS — Z3A10 10 weeks gestation of pregnancy: Secondary | ICD-10-CM | POA: Diagnosis not present

## 2017-10-05 DIAGNOSIS — O321XX0 Maternal care for breech presentation, not applicable or unspecified: Secondary | ICD-10-CM | POA: Diagnosis not present

## 2017-10-05 DIAGNOSIS — O0943 Supervision of pregnancy with grand multiparity, third trimester: Secondary | ICD-10-CM | POA: Diagnosis not present

## 2017-10-05 DIAGNOSIS — Z8674 Personal history of sudden cardiac arrest: Secondary | ICD-10-CM

## 2017-10-05 DIAGNOSIS — I519 Heart disease, unspecified: Secondary | ICD-10-CM | POA: Diagnosis not present

## 2017-10-05 NOTE — Consult Note (Signed)
MATERNAL FETAL MEDICINE CONSULT  Patient Name: Rachel Duncan Medical Record Number:  [id number] Date of Birth: 11-08-1981 Requesting Physician Name:  Tresa Garter, MD Date of Service: 10/05/2017  Chief Complaint Ventricular fibrillation with AICD in place  History of Present Illness Rachel Duncan is a 36 y.o. [id number],JY [redacted]w[redacted]d with an EDD of 12/24/2017, by Ultrasound dating method who has a history of a cardiac arrest approximately 1 year ago, which was subsequently found to be due to ventricular fibrillation.  She was defibrillated in the field and initially managed on medication.  Approximately 2 month after the cardiac arrest she had an AICD placed.  Since the initial episode she has not have any cardiac symptoms.  She has a history of alcohol abuse but reports she has been sober since her cardiac arrest approximately 1 year ago.  She has no acute complaints of issues today.  Review of Systems Pertinent items are noted in HPI.  Patient History OB History  Gravida Para Term Preterm AB Living  7 6 6     5   SAB TAB Ectopic Multiple Live Births          6    # Outcome Date GA Lbr Len/2nd Weight Sex Delivery Anes PTL Lv  7 Current           6 Term Mar 04, 2014 [redacted]w[redacted]d  5 lb 6 oz (2.438 kg) F Vag-Spont None  LIV     Birth Comments: no complications  5 Term 7829 [redacted]w[redacted]d  5 lb 6 oz (2.438 kg) F  None  LIV     Birth Comments: no complications   4 Term 5621 [redacted]w[redacted]d  5 lb 5 oz (2.41 kg) M Vag-Spont None  LIV     Birth Comments: no complications  3 Term 3086 [redacted]w[redacted]d  6 lb 5 oz (2.863 kg) M Vag-Spont None  DEC     Birth Comments: no complications; CRib death 55 month old  2 Term 03-04-06 [redacted]w[redacted]d  5 lb 5 oz (2.41 kg) F Vag-Spont   LIV     Birth Comments: no complications  1 Term 5784 [redacted]w[redacted]d  5 lb 5 oz (2.41 kg) F Vag-Spont None  LIV     Birth Comments: no complications      Past Medical History:  Diagnosis Date  . Cardiac arrest ([place])   . Encephalopathy   . Seizures ([place])   . Ventricular fibrillation  [facility])     Past Surgical History:  Procedure Laterality Date  . Left Heart Cath and Coronary Angiography N/A 10/21/2016   Performed by Adrian Prows, MD at [place] [facility]  . SubQ ICD Implant N/A 01/12/2017   Performed by Evans Lance, MD at [place] [facility]    Social History   Socioeconomic History  . Marital status: Single    Spouse name: Not on file  . Number of children: Not on file  . Years of education: Not on file  . Highest education level: Not on file  Social Needs  . Financial resource strain: Not on file  . Food insecurity - worry: Not on file  . Food insecurity - inability: Not on file  . Transportation needs - medical: Not on file  . Transportation needs - non-medical: Not on file  Occupational History  . Not on file  Tobacco Use  . Smoking status: Former Smoker    Packs/day: 0.50    Types: Cigarettes    Last attempt to quit: 09/24/2016    Years since  quitting: 1.0  . Smokeless tobacco: Never Used  Substance and Sexual Activity  . Alcohol use: Yes  . Drug use: No  . Sexual activity: Not Currently    Birth control/protection: None  Other Topics Concern  . Not on file  Social History Narrative  . Not on file    Family History  Problem Relation Age of Onset  . Heart attack Father    In addition, the patient has no family history of mental retardation, birth defects, or genetic diseases.  Physical Examination Vitals - BP 109/69, Pulse 67, Weight 171 lbs. General appearance - alert, well appearing, and in no distress Mental status - alert, oriented to person, place, and time  Assessment and Recommendations 1.  History of ventricular fibrillation cardiac arrest with AICD in place.  Rachel Duncan has not had any further ventricular fibrillation since her cardiac arrest and currently does not have any cardiac symptoms.  Given her risk of pregnancy complications is not increased due to this history.  However, she should continue to follow closely with her  cardiologist so that her AICD can be interrogated regularly and medications adjusted as needed.  I will defer the decision as to whether Rachel Duncan needs and echocardiogram.  She should also have serial growth ultrasounds everyone month and antenatal fetal testing starting at 32 weeks. 2.  History of alcohol abuse.  Rachel Duncan reports that she has been sober since the cardiac arrest 1 year ago.  Rachel Duncan is aware of the fetal risks of maternal alcohol consumption.  She should be monitored close for relapse. 3.  Multiple fetal anomalies.  Rachel Duncan had an ultrasound today the revealed several fetal anomalies including fetal growth restriction, oligohydramnios, bowed femurs, absent bladder, and abnormal female appearing genitalia.  She was counseled extensively by Dr. Burnett Harry regarding this findings.  For full details please see ultrasound report.  Rachel Duncan was offered genetic counseling today, but was unable to stay for that appointment.  She will return in one week for a repeat ultrasound and genetic counseling to discuss her genetic testing options.  I spent 30 minutes with Rachel Duncan today of which 50% was face-to-face counseling.  Thank you for referring Rachel Duncan to the [facility].  Please do not hesitate to contact us with questions.   Jolyn Lent, MD

## 2017-10-09 ENCOUNTER — Encounter (HOSPITAL_COMMUNITY): Payer: Self-pay

## 2017-10-09 ENCOUNTER — Ambulatory Visit (HOSPITAL_COMMUNITY): Admission: RE | Admit: 2017-10-09 | Payer: Medicare Other | Source: Ambulatory Visit

## 2017-10-09 ENCOUNTER — Encounter: Payer: Medicare Other | Admitting: Medical

## 2017-10-09 ENCOUNTER — Ambulatory Visit (INDEPENDENT_AMBULATORY_CARE_PROVIDER_SITE_OTHER): Payer: Medicare Other | Admitting: Family Medicine

## 2017-10-09 ENCOUNTER — Other Ambulatory Visit (HOSPITAL_COMMUNITY): Payer: Self-pay | Admitting: Maternal and Fetal Medicine

## 2017-10-09 ENCOUNTER — Telehealth (HOSPITAL_COMMUNITY): Payer: Self-pay

## 2017-10-09 ENCOUNTER — Ambulatory Visit (HOSPITAL_COMMUNITY)
Admission: RE | Admit: 2017-10-09 | Discharge: 2017-10-09 | Disposition: A | Discharge status: Home / Self Care | Payer: Medicare Other | Source: Ambulatory Visit | Attending: Family Medicine | Admitting: Family Medicine

## 2017-10-09 ENCOUNTER — Encounter: Payer: Self-pay | Admitting: Family Medicine

## 2017-10-09 ENCOUNTER — Other Ambulatory Visit (HOSPITAL_COMMUNITY): Payer: Self-pay | Admitting: *Deleted

## 2017-10-09 DIAGNOSIS — O283 Abnormal ultrasonic finding on antenatal screening of mother: Secondary | ICD-10-CM | POA: Insufficient documentation

## 2017-10-09 DIAGNOSIS — O359XX0 Maternal care for (suspected) fetal abnormality and damage, unspecified, not applicable or unspecified: Secondary | ICD-10-CM | POA: Insufficient documentation

## 2017-10-09 DIAGNOSIS — O99353 Diseases of the nervous system complicating pregnancy, third trimester: Secondary | ICD-10-CM

## 2017-10-09 DIAGNOSIS — Z8674 Personal history of sudden cardiac arrest: Secondary | ICD-10-CM | POA: Insufficient documentation

## 2017-10-09 DIAGNOSIS — O0990 Supervision of high risk pregnancy, unspecified, unspecified trimester: Secondary | ICD-10-CM

## 2017-10-09 DIAGNOSIS — Z641 Problems related to multiparity: Secondary | ICD-10-CM

## 2017-10-09 DIAGNOSIS — G40909 Epilepsy, unspecified, not intractable, without status epilepticus: Secondary | ICD-10-CM

## 2017-10-09 DIAGNOSIS — O0940 Supervision of pregnancy with grand multiparity, unspecified trimester: Secondary | ICD-10-CM | POA: Insufficient documentation

## 2017-10-09 DIAGNOSIS — O4100X0 Oligohydramnios, unspecified trimester, not applicable or unspecified: Secondary | ICD-10-CM

## 2017-10-09 DIAGNOSIS — O2690 Pregnancy related conditions, unspecified, unspecified trimester: Secondary | ICD-10-CM | POA: Diagnosis not present

## 2017-10-09 DIAGNOSIS — O365930 Maternal care for other known or suspected poor fetal growth, third trimester, not applicable or unspecified: Secondary | ICD-10-CM | POA: Insufficient documentation

## 2017-10-09 DIAGNOSIS — O0933 Supervision of pregnancy with insufficient antenatal care, third trimester: Secondary | ICD-10-CM | POA: Insufficient documentation

## 2017-10-09 DIAGNOSIS — Z23 Encounter for immunization: Secondary | ICD-10-CM | POA: Diagnosis present

## 2017-10-09 DIAGNOSIS — Z3A29 29 weeks gestation of pregnancy: Secondary | ICD-10-CM

## 2017-10-09 DIAGNOSIS — O4103X0 Oligohydramnios, third trimester, not applicable or unspecified: Secondary | ICD-10-CM | POA: Diagnosis not present

## 2017-10-09 DIAGNOSIS — O0993 Supervision of high risk pregnancy, unspecified, third trimester: Secondary | ICD-10-CM

## 2017-10-09 DIAGNOSIS — O358XX0 Maternal care for other (suspected) fetal abnormality and damage, not applicable or unspecified: Secondary | ICD-10-CM | POA: Diagnosis not present

## 2017-10-09 DIAGNOSIS — O0943 Supervision of pregnancy with grand multiparity, third trimester: Secondary | ICD-10-CM | POA: Diagnosis not present

## 2017-10-09 NOTE — Telephone Encounter (Signed)
Patient left without making follow-up appointments.  scheduler called to make appointment.  Rachel Duncan wanted to know why she needed to return and requested an explanation for the follow-up appointment.  I called the patient to give her an explanation.  I left a voicemail explaining the reason for the follow-up ultrasound.  I included her next appointment time on Tuesday, 11/27 at 12:45 pm.  I also asked her to call back if she has further questions.

## 2017-10-09 NOTE — Progress Notes (Signed)
   PRENATAL VISIT NOTE  Subjective:  Rachel Duncan is a 36 y.o. [id number] at [redacted]w[redacted]d being seen today for ongoing prenatal care.  She is currently monitored for the following issues for this high-risk pregnancy and has Cardiac arrest ([place]); Acute respiratory failure with hypoxia ([place]); Anoxic encephalopathy ([place]); Seizure ([place]); Ventricular fibrillation ([place]); Supervision of high risk pregnancy, antepartum; History of alcohol abuse; Grand multipara; and Pregnancy complicated by multiple fetal congenital anomalies, single gestation on their problem list.  Patient reports no complaints.  Contractions: Not present. Vag. Bleeding: None.  Movement: (!) Decreased. Denies leaking of fluid.   The following portions of the patient's history were reviewed and updated as appropriate: allergies, current medications, past family history, past medical history, past social history, past surgical history and problem list. Problem list updated.  Objective:   Vitals:   10/09/17 1004 10/09/17 1012  BP: (!) 98/48 112/70  Pulse: 68 71  Weight: 171 lb 4.8 oz (77.7 kg)     Fetal Status: Fetal Heart Rate (bpm): 141 Fundal Height: 28 cm Movement: (!) Decreased     General:  Alert, oriented and cooperative. Patient is in no acute distress.  Skin: Skin is warm and dry. No rash noted.   Cardiovascular: Normal heart rate noted  Respiratory: Normal respiratory effort, no problems with respiration noted  Abdomen: Soft, gravid, appropriate for gestational age.  Pain/Pressure: Absent     Pelvic: Cervical exam deferred        Extremities: Normal range of motion.  Edema: Trace  Mental Status:  Normal mood and affect. Normal behavior. Normal judgment and thought content.   Assessment and Plan:  Pregnancy: [id number] at [redacted]w[redacted]d  1. Pregnancy complicated by multiple fetal congenital anomalies, single gestation Has f/u with genetics today  2. Supervision of high risk pregnancy, antepartum - Tdap vaccine greater than or equal  to 7yo IM Has cards f/u tomorrow for h/o implantable defibrillator  Preterm labor symptoms and general obstetric precautions including but not limited to vaginal bleeding, contractions, leaking of fluid and fetal movement were reviewed in detail with the patient. Please refer to After Visit Summary for other counseling recommendations.  Return in 2 weeks (on 10/23/2017).   Donnamae Jude, MD

## 2017-10-09 NOTE — Patient Instructions (Signed)
Third Trimester of Pregnancy The third trimester is from week 28 through week 40 (months 7 through 9). The third trimester is a time when the unborn baby (fetus) is growing rapidly. At the end of the ninth month, the fetus is about 20 inches in length and weighs 6-10 pounds. Body changes during your third trimester Your body will continue to go through many changes during pregnancy. The changes vary from woman to woman. During the third trimester:  Your weight will continue to increase. You can expect to gain 25-35 pounds (11-16 kg) by the end of the pregnancy.  You may begin to get stretch marks on your hips, abdomen, and breasts.  You may urinate more often because the fetus is moving lower into your pelvis and pressing on your bladder.  You may develop or continue to have heartburn. This is caused by increased hormones that slow down muscles in the digestive tract.  You may develop or continue to have constipation because increased hormones slow digestion and cause the muscles that push waste through your intestines to relax.  You may develop hemorrhoids. These are swollen veins (varicose veins) in the rectum that can itch or be painful.  You may develop swollen, bulging veins (varicose veins) in your legs.  You may have increased body aches in the pelvis, back, or thighs. This is due to weight gain and increased hormones that are relaxing your joints.  You may have changes in your hair. These can include thickening of your hair, rapid growth, and changes in texture. Some women also have hair loss during or after pregnancy, or hair that feels dry or thin. Your hair will most likely return to normal after your baby is born.  Your breasts will continue to grow and they will continue to become tender. A yellow fluid (colostrum) may leak from your breasts. This is the first milk you are producing for your baby.  Your belly button may stick out.  You may notice more swelling in your hands,  face, or ankles.  You may have increased tingling or numbness in your hands, arms, and legs. The skin on your belly may also feel numb.  You may feel short of breath because of your expanding uterus.  You may have more problems sleeping. This can be caused by the size of your belly, increased need to urinate, and an increase in your body's metabolism.  You may notice the fetus "dropping," or moving lower in your abdomen (lightening).  You may have increased vaginal discharge.  You may notice your joints feel loose and you may have pain around your pelvic bone.  What to expect at prenatal visits You will have prenatal exams every 2 weeks until week 36. Then you will have weekly prenatal exams. During a routine prenatal visit:  You will be weighed to make sure you and the baby are growing normally.  Your blood pressure will be taken.  Your abdomen will be measured to track your baby's growth.  The fetal heartbeat will be listened to.  Any test results from the previous visit will be discussed.  You may have a cervical check near your due date to see if your cervix has softened or thinned (effaced).  You will be tested for Group B streptococcus. This happens between 35 and 37 weeks.  Your health care provider may ask you:  What your birth plan is.  How you are feeling.  If you are feeling the baby move.  If you have had   any abnormal symptoms, such as leaking fluid, bleeding, severe headaches, or abdominal cramping.  If you are using any tobacco products, including cigarettes, chewing tobacco, and electronic cigarettes.  If you have any questions.  Other tests or screenings that may be performed during your third trimester include:  Blood tests that check for low iron levels (anemia).  Fetal testing to check the health, activity level, and growth of the fetus. Testing is done if you have certain medical conditions or if there are problems during the  pregnancy.  Nonstress test (NST). This test checks the health of your baby to make sure there are no signs of problems, such as the baby not getting enough oxygen. During this test, a belt is placed around your belly. The baby is made to move, and its heart rate is monitored during movement.  What is false labor? False labor is a condition in which you feel small, irregular tightenings of the muscles in the womb (contractions) that usually go away with rest, changing position, or drinking water. These are called Braxton Hicks contractions. Contractions may last for hours, days, or even weeks before true labor sets in. If contractions come at regular intervals, become more frequent, increase in intensity, or become painful, you should see your health care provider. What are the signs of labor?  Abdominal cramps.  Regular contractions that start at 10 minutes apart and become stronger and more frequent with time.  Contractions that start on the top of the uterus and spread down to the lower abdomen and back.  Increased pelvic pressure and dull back pain.  A watery or bloody mucus discharge that comes from the vagina.  Leaking of amniotic fluid. This is also known as your "water breaking." It could be a slow trickle or a gush. Let your health care provider know if it has a color or strange odor. If you have any of these signs, call your health care provider right away, even if it is before your due date. Follow these instructions at home: Medicines  Follow your health care provider's instructions regarding medicine use. Specific medicines may be either safe or unsafe to take during pregnancy.  Take a prenatal vitamin that contains at least 600 micrograms (mcg) of folic acid.  If you develop constipation, try taking a stool softener if your health care provider approves. Eating and drinking  Eat a balanced diet that includes fresh fruits and vegetables, whole grains, good sources of protein  such as meat, eggs, or tofu, and low-fat dairy. Your health care provider will help you determine the amount of weight gain that is right for you.  Avoid raw meat and uncooked cheese. These carry germs that can cause birth defects in the baby.  If you have low calcium intake from food, talk to your health care provider about whether you should take a daily calcium supplement.  Eat four or five small meals rather than three large meals a day.  Limit foods that are high in fat and processed sugars, such as fried and sweet foods.  To prevent constipation: ? Drink enough fluid to keep your urine clear or pale yellow. ? Eat foods that are high in fiber, such as fresh fruits and vegetables, whole grains, and beans. Activity  Exercise only as directed by your health care provider. Most women can continue their usual exercise routine during pregnancy. Try to exercise for 30 minutes at least 5 days a week. Stop exercising if you experience uterine contractions.  Avoid heavy   lifting.  Do not exercise in extreme heat or humidity, or at high altitudes.  Wear low-heel, comfortable shoes.  Practice good posture.  You may continue to have sex unless your health care provider tells you otherwise. Relieving pain and discomfort  Take frequent breaks and rest with your legs elevated if you have leg cramps or low back pain.  Take warm sitz baths to soothe any pain or discomfort caused by hemorrhoids. Use hemorrhoid cream if your health care provider approves.  Wear a good support bra to prevent discomfort from breast tenderness.  If you develop varicose veins: ? Wear support pantyhose or compression stockings as told by your healthcare provider. ? Elevate your feet for 15 minutes, 3-4 times a day. Prenatal care  Write down your questions. Take them to your prenatal visits.  Keep all your prenatal visits as told by your health care provider. This is important. Safety  Wear your seat belt at  all times when driving.  Make a list of emergency phone numbers, including numbers for family, friends, the hospital, and police and fire departments. General instructions  Avoid cat litter boxes and soil used by cats. These carry germs that can cause birth defects in the baby. If you have a cat, ask someone to clean the litter box for you.  Do not travel far distances unless it is absolutely necessary and only with the approval of your health care provider.  Do not use hot tubs, steam rooms, or saunas.  Do not drink alcohol.  Do not use any products that contain nicotine or tobacco, such as cigarettes and e-cigarettes. If you need help quitting, ask your health care provider.  Do not use any medicinal herbs or unprescribed drugs. These chemicals affect the formation and growth of the baby.  Do not douche or use tampons or scented sanitary pads.  Do not cross your legs for long periods of time.  To prepare for the arrival of your baby: ? Take prenatal classes to understand, practice, and ask questions about labor and delivery. ? Make a trial run to the hospital. ? Visit the hospital and tour the maternity area. ? Arrange for maternity or paternity leave through employers. ? Arrange for family and friends to take care of pets while you are in the hospital. ? Purchase a rear-facing car seat and make sure you know how to install it in your car. ? Pack your hospital bag. ? Prepare the baby's nursery. Make sure to remove all pillows and stuffed animals from the baby's crib to prevent suffocation.  Visit your dentist if you have not gone during your pregnancy. Use a soft toothbrush to brush your teeth and be gentle when you floss. Contact a health care provider if:  You are unsure if you are in labor or if your water has broken.  You become dizzy.  You have mild pelvic cramps, pelvic pressure, or nagging pain in your abdominal area.  You have lower back pain.  You have persistent  nausea, vomiting, or diarrhea.  You have an unusual or bad smelling vaginal discharge.  You have pain when you urinate. Get help right away if:  Your water breaks before 37 weeks.  You have regular contractions less than 5 minutes apart before 37 weeks.  You have a fever.  You are leaking fluid from your vagina.  You have spotting or bleeding from your vagina.  You have severe abdominal pain or cramping.  You have rapid weight loss or weight gain.    You have shortness of breath with chest pain.  You notice sudden or extreme swelling of your face, hands, ankles, feet, or legs.  Your baby makes fewer than 10 movements in 2 hours.  You have severe headaches that do not go away when you take medicine.  You have vision changes. Summary  The third trimester is from week 28 through week 40, months 7 through 9. The third trimester is a time when the unborn baby (fetus) is growing rapidly.  During the third trimester, your discomfort may increase as you and your baby continue to gain weight. You may have abdominal, leg, and back pain, sleeping problems, and an increased need to urinate.  During the third trimester your breasts will keep growing and they will continue to become tender. A yellow fluid (colostrum) may leak from your breasts. This is the first milk you are producing for your baby.  False labor is a condition in which you feel small, irregular tightenings of the muscles in the womb (contractions) that eventually go away. These are called Braxton Hicks contractions. Contractions may last for hours, days, or even weeks before true labor sets in.  Signs of labor can include: abdominal cramps; regular contractions that start at 10 minutes apart and become stronger and more frequent with time; watery or bloody mucus discharge that comes from the vagina; increased pelvic pressure and dull back pain; and leaking of amniotic fluid. This information is not intended to replace advice  given to you by your health care provider. Make sure you discuss any questions you have with your health care provider. Document Released: 10/31/2001 Document Revised: 04/13/2016 Document Reviewed: 01/07/2013 Elsevier Interactive Patient Education  2017 [employer]  

## 2017-10-10 ENCOUNTER — Other Ambulatory Visit (HOSPITAL_COMMUNITY): Payer: Self-pay | Admitting: *Deleted

## 2017-10-10 DIAGNOSIS — O359XX0 Maternal care for (suspected) fetal abnormality and damage, unspecified, not applicable or unspecified: Secondary | ICD-10-CM

## 2017-10-10 DIAGNOSIS — O358XX0 Maternal care for other (suspected) fetal abnormality and damage, not applicable or unspecified: Secondary | ICD-10-CM

## 2017-10-12 NOTE — Progress Notes (Addendum)
Genetic Counseling  High-Risk Gestation Note  Appointment Date:  10/09/2017 Referred By: Donnamae Jude, MD Date of Birth:  10/07/1981   Pregnancy History: [id number] Estimated Date of Delivery: 12/24/17 Estimated Gestational Age: 59w1dAttending: [place] MD   I met with Mrs. Rachel Dockhamfor genetic counseling because of abnormal ultrasound findings.  In summary:  Discussed ultrasound findings in detail  Discussed possible etiologies including single gene, chromosomal, sporadic, multifactorial, environmental  Prognosis depends in part on etiology but also may be poor given the degree of oligohydramnios depending upon the gestational age of onset  Reviewed options for additional screening  [facility]- [place] [facility] [place] today due to her transportation  Expanded pan-ethnic carrier screening panel- elected to pursue but unable to stay today for blood draw  Ongoing ultrasound  Reviewed options for diagnostic testing, including risks, benefits, limitations and alternatives  Amniocentesis- declined  Reviewed other explanations for ultrasound findings  Reviewed family history concerns  [facility] and follow up 10/09/17. Ultrasound visualized oligohydramnios and multiple fetal anomalies: bowed femurs, hypoplastic midface, single umbilical artery, echogenic bowel, bladder not identified - ? cloacal anomaly. Fetal measurements were visualized lagging by 2 weeks; EFW < 10th %tile; AC < 3rd %tile.   See separate ultrasound reports for complete results.   We discussed these findings in detail.  Specifically, we discussed that congenital anomalies can occur as isolated, nonsyndromic birth defects, or as features of an underlying genetic syndrome.  The risk for a genetic etiology increases with the presence of multiple fetal anatomic differences.  Based on the combination of  ultrasound findings, the risk for fetal aneuploidy is expected to be significantly increased.  We reviewed chromosomes, nondisjunction, and the common features of Down syndrome, trisomy 157 and trisomy 142  In addition, we discussed the risk for other chromosome aberrations including microdeletions, duplications, insertions, and translocations. We also discussed that multiple fetal anomalies can be due to an underlying single gene condition which can be sporadic or occur through various mechanisms including autosomal dominant, autosomal recessive, and X-linked.   Ms. ENavada Osterhoutwas counseled that oligohydramnios is defined as a decrease in the volume of amniotic fluid, occurring in approximately 3-8% of pregnancies. We discussed that oligohydramnios can occur as a result of decreased urinary production or excretion or loss of amniotic fluid. The causes of oligohydramnios include premature rupture of membranes, placental insufficiency, fetal anomalies, use of specific medications by the mother of the fetus, chromosome conditions, abnormalities involving multiple gestations, and idiopathic causes. In the second trimester, approximately 7% of cases of oligohydramnios are due to abruption, 15% due to fetal anomalies, and 50% are due to premature rupture of membranes. We discussed that the fetal prognosis of oligohydramnios depends on the gestational age at the diagnosis as well as the underlying cause. We discussed that earlier onset of oligohydramnios is associated with increased risk for pulmonary hypoplasia and respiratory distress in the newborn period. We discussed that it is difficult to assess the onset of the oligohydramnios, given that previous ultrasounds are not available to compare amniotic fluid volume in the second trimester.   Mrs. WPociuswas then counseled regarding the availability of amniocentesis (at 15+ weeks gestation) including the associated risks, benefits, and limitations.  We also  discussed that amniocentesis would be technically challenging to perform given the presence of oligohydramnios. She understands that chromosome analysis can be performed both prenatally (  amniotic fluid) and postnatally (peripheral blood or cord blood).  Additionally, we discussed the availability of microarray analysis, which can also be performed pre and postnatally.  She was counseled regarding the option of noninvasive prenatal screening (NIPS)/cell free DNA testing.  We reviewed that this technology evaluates fragments of fetal (placental) DNA found in maternal circulation to predict the chance of specific chromosome conditions in the fetus.  Although highly specific and sensitive, this testing is not considered diagnostic.  We reviewed that NIPS specifically assesses the risk of fetal Down syndrome, trisomy 76, trisomy 31, fetal sex chromosome aneuploidy, triploidy, and select microdeletions (22q11 deletion syndrome); currently, this technology cannot assess the risk for all chromosome aberrations or single gene conditions. We discussed the possible results that the tests might provide including: positive, negative, unanticipated, and no result. Finally, she was counseled regarding the cost of each option and potential out of pocket expenses. After thoughtful consideration of their options, [name] elected to have NIPS ([facility] through [employer]) but was unable to stay for lab draw due to transportation concerns.    We then discussed other possible explanations for the above discussed ultrasound findings including single gene conditions.  They were counseled that single gene conditions are typically tested for postnatally, based on the recommendation of a medical geneticist, unless ultrasound findings or the family history are strongly suggestive of a specific syndrome.  We discussed that prenatal screening and testing is not typically available for all single gene conditions associated  with the ultrasound findings in the current pregnancy. We discussed the option of carrier screening for a select panel of autosomal recessive and some X-linked conditions, some of which but not all can have the current ultrasound findings as associated features. ACOG currently recommends that all patients be offered carrier screening for cystic fibrosis, spinal muscular atrophy and hemoglobinopathies. In addition, they were counseled that there are a variety of genetic screening laboratories that have pan-ethnic, or expanded, carrier screening panels, which evaluate carrier status for a wide range of genetic conditions. Some of these conditions are severe and actionable, but also rare; others occur more commonly, but are less severe. We discussed that testing options range from screening for a single condition to panels of more than 200 autosomal or X-linked genetic conditions. The prevalence of each condition varies (and often varies with ethnicity). Thus the couples' background risk to be a carrier for each of these various conditions would range, and in some cases be very low or unknown. We reviewed that a negative carrier screen would thus reduce, but not eliminate the chance to be a carrier for these conditions. For some conditions included on specific pan-ethnic carrier screening panels, the phenotype may not yet be well defined. For the majority of conditions on pan-ethnic carrier screening panels, identification of carrier status is not expected to be associated with medical features for the carrier; However, there are currently few exceptions where carrier status has been shown to increase the chance for certain medical concerns. We reviewed that in the event that one partner is found to be a carrier for one or more conditions, carrier screening would be available to the partner for those conditions. We discussed the risks, benefits, and limitations of carrier screening with the couple. After thoughtful  consideration of their options, Ms. Michaela Broski elected to pursue expanded pan-ethnic carrier screening (including ACOG recommended panel) but was unable to stay for lab draw at the time of today's visit due to transportation concerns.  We discussed that multiple congenital anomalies can also result from teratogenic exposures.  Ms. Alantis Bethune denied the use of illicit substances, medications, or alcohol during this pregnancy.  Ms. Holzheimer was counseled that the prognosis and postnatal management depend on the underlying etiology of the fetal differences.  We discussed the option of serial sonography, fetal echocardiogram, and a postnatal consultation with a medical geneticist, if warranted.  She understands that an accurate discussion of prognosis is challenging, without additional information.  Both family histories were reviewed and found to be noncontributory for birth defects, intellectual disability, and known genetic conditions. The patient's first child, a son, passed away from Sudden Infant Death Syndrome (SIDS). Her subsequent children are in good health. This pregnancy is the first for the couple together and the first child for the father of the pregnancy. African American ancestry was reported for the couple, and consanguinity was denied. Without further information regarding the provided family history, an accurate genetic risk cannot be calculated. Further genetic counseling is warranted if more information is obtained.  Ms. Annaleah Arata denied exposure to environmental toxins or chemical agents. She denied the use of alcohol, tobacco or street drugs during the pregnancy. She was previously seen for MFM consultation, given her personal medical history. See separate MFM consult note from 10/05/17 for detailed discussion.   I counseled Ms. Marlina Cataldi regarding the above risks and available options.  The approximate face-to-face time with the genetic counselor was 30 minutes.  Chipper Oman, MS,  Certified Genetic Counselor 10/12/2017

## 2017-10-15 ENCOUNTER — Telehealth: Payer: Self-pay | Admitting: Family Medicine

## 2017-10-15 NOTE — Telephone Encounter (Signed)
Will route to PCP 

## 2017-10-15 NOTE — Telephone Encounter (Signed)
Pt. Called stating that she goes to the women's clinic in [place] for her pregnancy. Pt. States she lives in [place] and it is to far away for her to come to [place]. . Pt. Would like to be referred somewhere in [place] for her pregnancy. Please f/u

## 2017-10-16 ENCOUNTER — Ambulatory Visit (HOSPITAL_COMMUNITY)
Admission: RE | Admit: 2017-10-16 | Discharge: 2017-10-16 | Disposition: A | Discharge status: Home / Self Care | Payer: Medicare Other | Source: Ambulatory Visit | Attending: Family Medicine | Admitting: Family Medicine

## 2017-10-16 ENCOUNTER — Ambulatory Visit (HOSPITAL_COMMUNITY): Payer: Medicare Other

## 2017-10-16 ENCOUNTER — Encounter (HOSPITAL_COMMUNITY): Payer: Self-pay

## 2017-10-16 NOTE — Telephone Encounter (Signed)
Rachel Duncan, could you please refer this patient to OB/GYN in [facility]?  Thank you

## 2017-10-16 NOTE — Telephone Encounter (Signed)
Sent Referral to   [facility] [address] [place],  [address] Phone [phone] Fax [phone]

## 2017-10-18 ENCOUNTER — Ambulatory Visit: Payer: Medicare Other | Attending: Family Medicine | Admitting: Family Medicine

## 2017-10-18 ENCOUNTER — Encounter: Payer: Self-pay | Admitting: Family Medicine

## 2017-10-18 VITALS — BP 117/68 | HR 78 | Temp 98.1°F | Ht 63.0 in | Wt 173.0 lb

## 2017-10-18 DIAGNOSIS — O0993 Supervision of high risk pregnancy, unspecified, third trimester: Secondary | ICD-10-CM

## 2017-10-18 DIAGNOSIS — Z79899 Other long term (current) drug therapy: Secondary | ICD-10-CM | POA: Diagnosis not present

## 2017-10-18 DIAGNOSIS — I4901 Ventricular fibrillation: Secondary | ICD-10-CM | POA: Diagnosis not present

## 2017-10-18 DIAGNOSIS — Z8674 Personal history of sudden cardiac arrest: Secondary | ICD-10-CM | POA: Insufficient documentation

## 2017-10-18 DIAGNOSIS — Z3A30 30 weeks gestation of pregnancy: Secondary | ICD-10-CM | POA: Insufficient documentation

## 2017-10-18 DIAGNOSIS — O99413 Diseases of the circulatory system complicating pregnancy, third trimester: Secondary | ICD-10-CM | POA: Insufficient documentation

## 2017-10-18 DIAGNOSIS — Z95810 Presence of automatic (implantable) cardiac defibrillator: Secondary | ICD-10-CM | POA: Diagnosis not present

## 2017-10-18 DIAGNOSIS — Z9889 Other specified postprocedural states: Secondary | ICD-10-CM | POA: Diagnosis not present

## 2017-10-18 NOTE — Patient Instructions (Signed)
Please keep your appointment with [facility] until you hear from the new OBGYN at:   [facility] [address] [place], [place] [address] Phone [phone] Fax [phone]

## 2017-10-18 NOTE — Progress Notes (Signed)
Subjective:  Patient ID: Rachel Duncan, female    DOB: Jul 19, 1981  Age: 36 y.o. MRN: [id number]  CC: Pregnancy  HPI Rachel Duncan  is a 36 year old female with a history of recent ICD placement status post V. fib arrest currently at [redacted]w[redacted]d gestational age who presents today for coordination of care.  She is currently receiving prenatal care at the [facility] and was last seen by Dr. Darron Doom on 10/09/17 at which time genetic counseling and labs had been ordered due to concern for fetal congenital anomalies.  She informs me she was unable to wait for labs as the wait time was long. She had in turn called the office requesting a referral to [facility] in [facility] which I had placed and this was faxed to [place] however she is yet to obtain an appointment.  She endorses fetal movements blood are decreased.  She denies nausea, vomiting and has a good appetite. She denies chest pain and has an upcoming appointment with her cardiologist.  Past Medical History:  Diagnosis Date  . Cardiac arrest ([place])   . Encephalopathy   . Seizures ([place])   . Ventricular fibrillation [facility])     Past Surgical History:  Procedure Laterality Date  . CARDIAC CATHETERIZATION N/A 10/21/2016   Procedure: Left Heart Cath and Coronary Angiography;  Surgeon: Adrian Prows, MD;  Location: [place] CV [facility];  Service: Cardiovascular;  Laterality: N/A;  . SUBQ ICD IMPLANT N/A 01/12/2017   Procedure: SubQ ICD Implant;  Surgeon: Evans Lance, MD;  Location: [place] [facility];  Service: Cardiovascular;  Laterality: N/A;    No Known Allergies   Outpatient Medications Prior to Visit  Medication Sig Dispense Refill  . metoprolol (LOPRESSOR) 50 MG tablet Take 25 mg by mouth 2 (two) times daily.   1  . prenatal vitamin w/FE, FA (PRENATAL 1 + 1) 27-1 MG TABS tablet Take 1 tablet daily at 12 noon by mouth. 30 each 6   No facility-administered medications prior to visit.      ROS Review of Systems  Constitutional: Negative for activity change, appetite change and fatigue.  HENT: Negative for congestion, sinus pressure and sore throat.   Eyes: Negative for visual disturbance.  Respiratory: Negative for cough, chest tightness, shortness of breath and wheezing.   Cardiovascular: Negative for chest pain and palpitations.  Gastrointestinal: Negative for abdominal distention, abdominal pain and constipation.  Endocrine: Negative for polydipsia.  Genitourinary: Negative for dysuria and frequency.  Musculoskeletal: Negative for arthralgias and back pain.  Skin: Negative for rash.  Neurological: Negative for tremors, light-headedness and numbness.  Hematological: Does not bruise/bleed easily.  Psychiatric/Behavioral: Negative for agitation and behavioral problems.    Objective:  BP 117/68   Pulse 78   Temp 98.1 F (36.7 C) (Oral)   Ht 5\' 3"  (1.6 m)   Wt 173 lb (78.5 kg)   LMP 05/07/2017 (LMP Unknown)   SpO2 100%   BMI 30.65 kg/m   BP/Weight 10/18/2017 10/09/2017 40/98/1191  Systolic BP 478 295 621  Diastolic BP 68 70 69  Wt. (Lbs) 173 171.3 -  BMI 30.65 30.34 -      Physical Exam  Constitutional: She is oriented to person, place, and time. She appears well-developed and well-nourished.  Cardiovascular: Normal rate, normal heart sounds and intact distal pulses. A regularly irregular rhythm present.  No murmur heard. Pulmonary/Chest: Effort normal and breath sounds normal. She has no wheezes. She has no rales. She exhibits no  tenderness.  Abdominal:  30 wk size uterus  Musculoskeletal: Normal range of motion.  Neurological: She is alert and oriented to person, place, and time.  Skin: Skin is warm and dry.  Psychiatric: She has a normal mood and affect.     Assessment & Plan:   1. High-risk pregnancy in third trimester Gestational age approximately 75 weeks 3 days Last seen by OB on 99/83/38 -regnancy complicated by fetal congenital  anomalies as per OB note She never followed up with genetics I have referred her to [facility] in [facility] as requested due to proximity and have provided her with this information and she has been strongly advised to keep her follow-up appointments at the [facility] until she has an appointment with [facility]  2. Ventricular fibrillation ([place]) Status post ICD placement Has upcoming appointment with cardiology   No orders of the defined types were placed in this encounter.   Follow-up: Return for Follow-up of chronic medical conditions; patient to call for appointment once post partum.   Arnoldo Morale MD

## 2017-10-19 DIAGNOSIS — Z3A29 29 weeks gestation of pregnancy: Secondary | ICD-10-CM | POA: Insufficient documentation

## 2017-10-23 ENCOUNTER — Encounter: Payer: Medicare Other | Admitting: Nurse Practitioner

## 2017-10-23 ENCOUNTER — Ambulatory Visit (HOSPITAL_COMMUNITY)
Admission: RE | Admit: 2017-10-23 | Discharge: 2017-10-23 | Disposition: A | Discharge status: Home / Self Care | Payer: Medicare Other | Source: Ambulatory Visit | Attending: Family Medicine | Admitting: Family Medicine

## 2017-11-01 ENCOUNTER — Encounter: Payer: Medicare Other | Admitting: Family Medicine

## 2017-11-01 ENCOUNTER — Telehealth: Payer: Self-pay | Admitting: Family Medicine

## 2017-11-01 NOTE — Telephone Encounter (Signed)
Called patient about her missed appointments. She informed me she was going to another provider in [place].

## 2017-11-02 ENCOUNTER — Encounter: Payer: Medicare Other | Admitting: Family Medicine

## 2017-11-05 ENCOUNTER — Encounter (HOSPITAL_COMMUNITY): Payer: Self-pay | Admitting: *Deleted

## 2017-11-05 ENCOUNTER — Ambulatory Visit (INDEPENDENT_AMBULATORY_CARE_PROVIDER_SITE_OTHER): Payer: Medicare Other | Admitting: Obstetrics and Gynecology

## 2017-11-05 ENCOUNTER — Ambulatory Visit: Payer: Self-pay

## 2017-11-05 ENCOUNTER — Encounter: Payer: Self-pay | Admitting: Obstetrics and Gynecology

## 2017-11-05 ENCOUNTER — Inpatient Hospital Stay (HOSPITAL_COMMUNITY)
Admission: AD | Admit: 2017-11-05 | Discharge: 2017-11-05 | Disposition: A | Discharge status: Home / Self Care | Payer: Medicare Other | Source: Ambulatory Visit | Attending: Obstetrics and Gynecology | Admitting: Obstetrics and Gynecology

## 2017-11-05 VITALS — BP 100/63 | HR 78 | Wt 172.3 lb

## 2017-11-05 DIAGNOSIS — O288 Other abnormal findings on antenatal screening of mother: Secondary | ICD-10-CM | POA: Diagnosis present

## 2017-11-05 DIAGNOSIS — O4103X0 Oligohydramnios, third trimester, not applicable or unspecified: Secondary | ICD-10-CM

## 2017-11-05 DIAGNOSIS — O359XX0 Maternal care for (suspected) fetal abnormality and damage, unspecified, not applicable or unspecified: Secondary | ICD-10-CM

## 2017-11-05 DIAGNOSIS — I4901 Ventricular fibrillation: Secondary | ICD-10-CM | POA: Diagnosis not present

## 2017-11-05 DIAGNOSIS — O0990 Supervision of high risk pregnancy, unspecified, unspecified trimester: Secondary | ICD-10-CM

## 2017-11-05 DIAGNOSIS — I469 Cardiac arrest, cause unspecified: Secondary | ICD-10-CM | POA: Diagnosis not present

## 2017-11-05 DIAGNOSIS — Z641 Problems related to multiparity: Secondary | ICD-10-CM

## 2017-11-05 DIAGNOSIS — O0993 Supervision of high risk pregnancy, unspecified, third trimester: Secondary | ICD-10-CM | POA: Diagnosis present

## 2017-11-05 DIAGNOSIS — O26893 Other specified pregnancy related conditions, third trimester: Secondary | ICD-10-CM | POA: Insufficient documentation

## 2017-11-05 DIAGNOSIS — Z87891 Personal history of nicotine dependence: Secondary | ICD-10-CM | POA: Insufficient documentation

## 2017-11-05 DIAGNOSIS — Z3A33 33 weeks gestation of pregnancy: Secondary | ICD-10-CM | POA: Insufficient documentation

## 2017-11-05 DIAGNOSIS — Z3689 Encounter for other specified antenatal screening: Secondary | ICD-10-CM

## 2017-11-05 NOTE — Progress Notes (Signed)
Pt taken to MAU for prolonged fetal monitoring due to FHR decel during NST and for plan of care determination.

## 2017-11-05 NOTE — Progress Notes (Signed)
Called and left message with Nira Conn in NICU for NICU tour. BTL papers signed. Panorama ordered and sent.

## 2017-11-05 NOTE — Progress Notes (Signed)
   PRENATAL VISIT NOTE  Subjective:  Rachel Duncan is a 36 y.o. [id number] at [redacted]w[redacted]d being seen today for ongoing prenatal care.  She is currently monitored for the following issues for this high-risk pregnancy and has Cardiac arrest ([place]); Acute respiratory failure with hypoxia ([place]); Anoxic encephalopathy ([place]); Seizure ([place]); Ventricular fibrillation ([place]); Supervision of high risk pregnancy, antepartum; History of alcohol abuse; Grand multipara; Pregnancy complicated by multiple fetal congenital anomalies, single gestation; and [redacted] weeks gestation of pregnancy on their problem list.  Patient reports no complaints.  Contractions: Not present. Vag. Bleeding: None.  Movement: Present. Denies leaking of fluid.   The following portions of the patient's history were reviewed and updated as appropriate: allergies, current medications, past family history, past medical history, past social history, past surgical history and problem list. Problem list updated.  Objective:   Vitals:   11/05/17 0958  BP: 100/63  Pulse: 78  Weight: 172 lb 4.8 oz (78.2 kg)    Fetal Status: Fetal Heart Rate (bpm): 140   Movement: Present     General:  Alert, oriented and cooperative. Patient is in no acute distress.  Skin: Skin is warm and dry. No rash noted.   Cardiovascular: Normal heart rate noted  Respiratory: Normal respiratory effort, no problems with respiration noted  Abdomen: Soft, gravid, appropriate for gestational age.  Pain/Pressure: Absent     Pelvic: Cervical exam deferred        Extremities: Normal range of motion.     Mental Status:  Normal mood and affect. Normal behavior. Normal judgment and thought content.   Assessment and Plan:  Pregnancy: [id number] at [redacted]w[redacted]d  1. Supervision of high risk pregnancy, antepartum - RPR - CBC  2. Cardiac arrest ([place]) 10/2016 - no known cause  3. Ventricular fibrillation ([place]) ICD in place, put in 12/2016 Has f/u with primary care  4. Pregnancy complicated by  multiple fetal congenital anomalies, single gestation Was referred to [facility] but they referred her back here because of medical issues, pt states they told her they could not do high risk there - Panorama and other screening drawn today BPP/NST 6/10 today NICU tour ordered To be set up for subsequent BPP/dopplers  5. Grand multipara For BTS, papers signed today 11/05/17  To MAU for monitoring given BPP 6/10, -2 for oligo and non-reactive NST  Preterm labor symptoms and general obstetric precautions including but not limited to vaginal bleeding, contractions, leaking of fluid and fetal movement were reviewed in detail with the patient. Please refer to After Visit Summary for other counseling recommendations.  Return in about 2 weeks (around 11/19/2017) for OB visit (MD).   Sloan Leiter, MD

## 2017-11-05 NOTE — Progress Notes (Signed)
Addendum: Panorama sent Pick up #[id number].  Panorama kit #[id number]

## 2017-11-05 NOTE — MAU Provider Note (Signed)
History     CSN: [id number]  Arrival date and time: 11/05/17 1217  Provider's first contact with patient at 12:40 PM     Chief Complaint  Patient presents with  . Non-stress Test   HPI  [name] is a 36 y.o. [id number] at [redacted]w[redacted]d gestation sent to MAU from [place] for BPP/NST 6/8 today for prolonged CEFM. She has known oligohydramios. She states she hasn't eaten this morning and is "very hungry". She has a known high risk pregnancy d/t previous cardiac arrest, seizures, acute respiratory failure, and ventricular fibrillation.  Past Medical History:  Diagnosis Date  . Cardiac arrest ([place])   . Encephalopathy   . Seizures ([place])   . Ventricular fibrillation [facility])     Past Surgical History:  Procedure Laterality Date  . CARDIAC CATHETERIZATION N/A 10/21/2016   Procedure: Left Heart Cath and Coronary Angiography;  Surgeon: Adrian Prows, MD;  Location: [place] [facility];  Service: Cardiovascular;  Laterality: N/A;  . SUBQ ICD IMPLANT N/A 01/12/2017   Procedure: SubQ ICD Implant;  Surgeon: Evans Lance, MD;  Location: [place] [facility];  Service: Cardiovascular;  Laterality: N/A;    Family History  Problem Relation Age of Onset  . Heart attack Father     Social History   Tobacco Use  . Smoking status: Former Smoker    Packs/day: 0.50    Types: Cigarettes    Last attempt to quit: 09/24/2016    Years since quitting: 1.1  . Smokeless tobacco: Never Used  Substance Use Topics  . Alcohol use: Yes  . Drug use: No    Allergies: No Known Allergies  Medications Prior to Admission  Medication Sig Dispense Refill Last Dose  . metoprolol (LOPRESSOR) 50 MG tablet Take 25 mg by mouth 2 (two) times daily.   1 Taking  . prenatal vitamin w/FE, FA (PRENATAL 1 + 1) 27-1 MG TABS tablet Take 1 tablet daily at 12 noon by mouth. 30 each 6 Taking    Review of Systems  Constitutional: Positive for fatigue.  HENT: Negative.   Eyes: Negative.   Respiratory: Negative.    Cardiovascular: Negative.   Gastrointestinal: Negative.   Endocrine: Negative.   Musculoskeletal: Negative.   Skin: Negative.   Allergic/Immunologic: Negative.   Neurological: Negative.   Hematological: Negative.   Psychiatric/Behavioral: Negative.    Physical Exam   Blood pressure 106/75, pulse 78, temperature (!) 97 F (36.1 C), resp. rate 18, last menstrual period 05/07/2017.  Physical Exam  Nursing note and vitals reviewed. Constitutional: She is oriented to person, place, and time. She appears well-developed and well-nourished.  HENT:  Head: Normocephalic.  Eyes: Pupils are equal, round, and reactive to light.  Neck: Normal range of motion.  Cardiovascular: Normal rate, regular rhythm and normal heart sounds.  Respiratory: Effort normal and breath sounds normal.  GI: Soft. Bowel sounds are normal.  Genitourinary:  Genitourinary Comments: Pelvic deferred  Musculoskeletal: Normal range of motion.  Neurological: She is oriented to person, place, and time.  Skin: Skin is warm and dry.  Psychiatric: She has a normal mood and affect. Her behavior is normal. Judgment and thought content normal.   MAU Course  Procedures  MDM Regular diet NST - FHR: 135 bpm / moderate variability / accels present / decels absent / TOCO: none  *Consult with Dr. Rip Harbour @ 1345 - recommended tx plan continue prolonged CEFM - ok to d/c home, if reactive NST -- schedule growth U/S and BPP for Thursday 12/20  with MFM, NST in MAU on Monday 12/24  Assessment and Plan  NST (non-stress test) reactive - Reassurance given of reactive NST - Information provided on [facility]   Supervision of high risk pregnancy, antepartum  - Go to MFM 12/30 for growth U/S and BPP - Return to MAU on 12/24 for NST  Discharge home Patient verbalized an understanding of the plan of care and agrees.   Laury Deep, MSN, CNM 11/05/2017, 12:40 PM

## 2017-11-05 NOTE — MAU Note (Signed)
Pt sent from clinic for failed NST. Needs prolonged monitoring.

## 2017-11-05 NOTE — Addendum Note (Signed)
Addended by: Samuel Germany on: 11/05/2017 03:05 PM   Modules accepted: Orders

## 2017-11-05 NOTE — MAU Note (Signed)
Urine in lab 

## 2017-11-05 NOTE — Progress Notes (Addendum)
Addendum:  Called to schedule fetal echo with Dr. Filbert Schilder- no appointment available thru end of this year or until 12/12/17- will try to schedule sooner and will call us back.

## 2017-11-06 ENCOUNTER — Encounter: Payer: Self-pay | Admitting: *Deleted

## 2017-11-07 ENCOUNTER — Telehealth: Payer: Self-pay | Admitting: General Practice

## 2017-11-07 NOTE — Telephone Encounter (Signed)
[facility] & worked patient in for urgent appt tomorrow at 845 for fetal echo. Called patient, no answer- left message on voicemail stating we are trying to reach you regarding an appointment we have set up for you tomorrow please call us back. Called patient's emergency contact sister's number and left message with her to have the patient call us back. Will send mychart message

## 2017-11-08 ENCOUNTER — Other Ambulatory Visit (HOSPITAL_COMMUNITY): Payer: Self-pay | Admitting: Obstetrics and Gynecology

## 2017-11-08 ENCOUNTER — Ambulatory Visit (HOSPITAL_COMMUNITY)
Admission: RE | Admit: 2017-11-08 | Discharge: 2017-11-08 | Disposition: A | Discharge status: Home / Self Care | Payer: Medicare Other | Source: Ambulatory Visit | Attending: Family Medicine | Admitting: Family Medicine

## 2017-11-08 ENCOUNTER — Encounter (HOSPITAL_COMMUNITY): Payer: Self-pay

## 2017-11-08 ENCOUNTER — Other Ambulatory Visit (HOSPITAL_COMMUNITY): Payer: Self-pay | Admitting: *Deleted

## 2017-11-08 DIAGNOSIS — O99353 Diseases of the nervous system complicating pregnancy, third trimester: Secondary | ICD-10-CM | POA: Diagnosis not present

## 2017-11-08 DIAGNOSIS — O359XX0 Maternal care for (suspected) fetal abnormality and damage, unspecified, not applicable or unspecified: Secondary | ICD-10-CM

## 2017-11-08 DIAGNOSIS — R9389 Abnormal findings on diagnostic imaging of other specified body structures: Secondary | ICD-10-CM | POA: Diagnosis not present

## 2017-11-08 DIAGNOSIS — O4103X0 Oligohydramnios, third trimester, not applicable or unspecified: Secondary | ICD-10-CM | POA: Diagnosis not present

## 2017-11-08 DIAGNOSIS — Z3A33 33 weeks gestation of pregnancy: Secondary | ICD-10-CM | POA: Diagnosis not present

## 2017-11-08 DIAGNOSIS — Z362 Encounter for other antenatal screening follow-up: Secondary | ICD-10-CM | POA: Diagnosis not present

## 2017-11-08 DIAGNOSIS — G40909 Epilepsy, unspecified, not intractable, without status epilepticus: Secondary | ICD-10-CM | POA: Diagnosis not present

## 2017-11-08 DIAGNOSIS — O283 Abnormal ultrasonic finding on antenatal screening of mother: Secondary | ICD-10-CM | POA: Diagnosis not present

## 2017-11-08 DIAGNOSIS — O358XX0 Maternal care for other (suspected) fetal abnormality and damage, not applicable or unspecified: Secondary | ICD-10-CM

## 2017-11-08 DIAGNOSIS — O365930 Maternal care for other known or suspected poor fetal growth, third trimester, not applicable or unspecified: Secondary | ICD-10-CM | POA: Diagnosis not present

## 2017-11-10 LAB — CBC
HEMATOCRIT: 34.9 % (ref 34.0–46.6)
Hemoglobin: 11.2 g/dL (ref 11.1–15.9)
MCH: 29.6 pg (ref 26.6–33.0)
MCHC: 32.1 g/dL (ref 31.5–35.7)
MCV: 92 fL (ref 79–97)
PLATELETS: 253 10*3/uL (ref 150–379)
RBC: 3.78 x10E6/uL (ref 3.77–5.28)
RDW: 14.3 % (ref 12.3–15.4)
WBC: 7 10*3/uL (ref 3.4–10.8)

## 2017-11-10 LAB — RPR: RPR Ser Ql: NONREACTIVE

## 2017-11-10 LAB — CYSTIC FIBROSIS MUTATION 97: Interpretation: NOT DETECTED

## 2017-11-12 ENCOUNTER — Inpatient Hospital Stay (HOSPITAL_COMMUNITY)
Admission: AD | Admit: 2017-11-12 | Discharge: 2017-11-12 | Disposition: A | Discharge status: Home / Self Care | Payer: Medicare Other | Source: Ambulatory Visit | Attending: Obstetrics & Gynecology | Admitting: Obstetrics & Gynecology

## 2017-11-12 DIAGNOSIS — O359XX0 Maternal care for (suspected) fetal abnormality and damage, unspecified, not applicable or unspecified: Secondary | ICD-10-CM

## 2017-11-12 DIAGNOSIS — Z3A34 34 weeks gestation of pregnancy: Secondary | ICD-10-CM

## 2017-11-12 DIAGNOSIS — O0993 Supervision of high risk pregnancy, unspecified, third trimester: Secondary | ICD-10-CM | POA: Insufficient documentation

## 2017-11-12 DIAGNOSIS — Z3689 Encounter for other specified antenatal screening: Secondary | ICD-10-CM

## 2017-11-12 LAB — SMN1 COPY NUMBER ANALYSIS (SMA CARRIER SCREENING)

## 2017-11-12 NOTE — MAU Note (Signed)
Pt here for NST. Denies any  Pain or cramping. Good fetal movement reported

## 2017-11-12 NOTE — MAU Provider Note (Signed)
Chief Complaint:  Non-stress Test   First Provider Initiated Contact with Patient 11/12/17 1458     HPI  HPI: Rachel Duncan is a 36 y.o. [id number] at 33w0dwho presents to maternity admissions reporting need for NST for oligohydramnios.  Is seen in high risk clinic for history of cardiac arrest and fetal anomalies.. She reports good fetal movement, denies LOF, vaginal bleeding, vaginal itching/burning, urinary symptoms, h/a, dizziness, n/v, diarrhea, constipation or fever/chills.  She denies headache, visual changes or RUQ abdominal pain.  RN Note: Pt here for NST. Denies any  Pain or cramping. Good fetal movement reported    Past Medical History: Past Medical History:  Diagnosis Date  . Cardiac arrest ([place])   . Encephalopathy   . Seizures ([place])   . Ventricular fibrillation ([facility])     Past obstetric history: OB History  Gravida Para Term Preterm AB Living  8 6 6   1 5   SAB TAB Ectopic Multiple Live Births  1       6    # Outcome Date GA Lbr Len/2nd Weight Sex Delivery Anes PTL Lv  8 Current           7 Term 2014/03/08 [redacted]w[redacted]d  5 lb 6 oz (2.438 kg) F Vag-Spont None  LIV     Birth Comments: no complications  6 Term 4782 [redacted]w[redacted]d  5 lb 6 oz (2.438 kg) F Vag-Spont None  LIV     Birth Comments: no complications   5 Term 9562 [redacted]w[redacted]d  5 lb 5 oz (2.41 kg) M Vag-Spont None  LIV     Birth Comments: no complications  4 Term 1308 [redacted]w[redacted]d  6 lb 5 oz (2.863 kg) M Vag-Spont None  DEC     Birth Comments: no complications; CRib death 52 month old  3 Term March 08[birth year] 2006 [redacted]w[redacted]d  5 lb 5 oz (2.41 kg) F Vag-Spont   LIV     Birth Comments: no complications  2 Term 6578 [redacted]w[redacted]d  5 lb 5 oz (2.41 kg) F Vag-Spont None  LIV     Birth Comments: no complications  1 SAB               Past Surgical History: Past Surgical History:  Procedure Laterality Date  . CARDIAC CATHETERIZATION N/A 10/21/2016   Procedure: Left Heart Cath and Coronary Angiography;  Surgeon: Adrian Prows, MD;  Location: [place] [facility];  Service:  Cardiovascular;  Laterality: N/A;  . SUBQ ICD IMPLANT N/A 01/12/2017   Procedure: SubQ ICD Implant;  Surgeon: Evans Lance, MD;  Location: [place] [facility];  Service: Cardiovascular;  Laterality: N/A;    Family History: Family History  Problem Relation Age of Onset  . Heart attack Father     Social History: Social History   Tobacco Use  . Smoking status: Former Smoker    Packs/day: 0.50    Types: Cigarettes    Last attempt to quit: 09/24/2016    Years since quitting: 1.1  . Smokeless tobacco: Never Used  Substance Use Topics  . Alcohol use: Yes  . Drug use: No    Allergies: No Known Allergies  Meds:  Medications Prior to Admission  Medication Sig Dispense Refill Last Dose  . metoprolol (LOPRESSOR) 50 MG tablet Take 25 mg by mouth 2 (two) times daily.   1 Taking  . prenatal vitamin w/FE, FA (PRENATAL 1 + 1) 27-1 MG TABS tablet Take 1 tablet daily at 12 noon by mouth. 30 each 6 Taking  I have reviewed patient's Past Medical Hx, Surgical Hx, Family Hx, Social Hx, medications and allergies.   ROS:  Review of Systems  Constitutional: Negative for chills and fever.  Respiratory: Negative for shortness of breath.   Gastrointestinal: Negative for abdominal pain, constipation, diarrhea and nausea.  Genitourinary: Negative for dysuria and pelvic pain.   Other systems negative  Physical Exam   Patient Vitals for the past 24 hrs:  BP Temp Temp src Pulse Resp  11/12/17 1448 105/65 97.6 F (36.4 C) Oral 65 18   Constitutional: Well-developed, well-nourished female in no acute distress.  Cardiovascular: normal rate and rhythm Respiratory: normal effort, clear to auscultation bilaterally GI: Abd soft, non-tender, gravid appropriate for gestational age.   No rebound or guarding. MS: Extremities nontender, no edema, normal ROM Neurologic: Alert and oriented x 4.  GU: Neg CVAT.  PELVIC EXAM:   deferred  FHT:  Baseline 130 , moderate variability, accelerations present,  no decelerations Contractions:  Irregular     Labs: B/Positive/-- (11/05 1108) No results found for this or any previous visit (from the past 24 hour(s)).  Imaging:    MAU Course/MDM:  NST reviewed and found to be reactive  Treatments in MAU included EFM.    Assessment: Single IUP at [redacted]w[redacted]d High risk pregnancy Reactive fetal heart rate tracing  Plan: Discharge home Preterm Labor precautions and fetal kick counts Follow up in Office for prenatal visits and recheck of status  Encouraged to return here or to other Urgent Care/ED if she develops worsening of symptoms, increase in pain, fever, or other concerning symptoms.   Pt stable at time of discharge.  Hansel Feinstein CNM, MSN Certified Nurse-Midwife 11/12/2017 2:58 PM

## 2017-11-15 ENCOUNTER — Ambulatory Visit (HOSPITAL_COMMUNITY)
Admission: RE | Admit: 2017-11-15 | Discharge: 2017-11-15 | Disposition: A | Discharge status: Home / Self Care | Payer: Medicare Other | Source: Ambulatory Visit | Attending: Obstetrics and Gynecology | Admitting: Obstetrics and Gynecology

## 2017-11-19 ENCOUNTER — Encounter: Payer: Medicare Other | Admitting: Obstetrics and Gynecology

## 2017-11-19 ENCOUNTER — Encounter: Payer: Self-pay | Admitting: Obstetrics and Gynecology

## 2017-11-21 NOTE — Progress Notes (Signed)
Patient did not keep OB appointment for 11/19/2017.  Durene Romans MD Attending Center for [employer])

## 2017-11-23 ENCOUNTER — Other Ambulatory Visit (HOSPITAL_COMMUNITY): Payer: Self-pay

## 2017-11-23 NOTE — Addendum Note (Signed)
Encounter addended by: Corinne Ports on: 11/23/2017 9:52 AM  Actions taken: Sign clinical note

## 2017-11-26 ENCOUNTER — Other Ambulatory Visit (HOSPITAL_COMMUNITY): Payer: Self-pay | Admitting: *Deleted

## 2017-11-26 ENCOUNTER — Encounter: Payer: Self-pay | Admitting: *Deleted

## 2017-11-26 DIAGNOSIS — O359XX0 Maternal care for (suspected) fetal abnormality and damage, unspecified, not applicable or unspecified: Secondary | ICD-10-CM

## 2017-11-27 ENCOUNTER — Encounter (HOSPITAL_COMMUNITY): Payer: Self-pay

## 2017-11-27 ENCOUNTER — Ambulatory Visit (INDEPENDENT_AMBULATORY_CARE_PROVIDER_SITE_OTHER): Payer: Medicare Other | Admitting: Obstetrics and Gynecology

## 2017-11-27 ENCOUNTER — Ambulatory Visit (HOSPITAL_COMMUNITY)
Admission: RE | Admit: 2017-11-27 | Discharge: 2017-11-27 | Disposition: A | Discharge status: Home / Self Care | Payer: Medicare Other | Source: Ambulatory Visit | Attending: Obstetrics and Gynecology | Admitting: Obstetrics and Gynecology

## 2017-11-27 ENCOUNTER — Other Ambulatory Visit (HOSPITAL_COMMUNITY)
Admission: RE | Admit: 2017-11-27 | Discharge: 2017-11-27 | Disposition: A | Discharge status: Home / Self Care | Payer: Medicare Other | Source: Ambulatory Visit | Attending: Obstetrics and Gynecology | Admitting: Obstetrics and Gynecology

## 2017-11-27 ENCOUNTER — Encounter: Payer: Self-pay | Admitting: Obstetrics and Gynecology

## 2017-11-27 ENCOUNTER — Other Ambulatory Visit (HOSPITAL_COMMUNITY): Payer: Self-pay | Admitting: Maternal and Fetal Medicine

## 2017-11-27 VITALS — BP 119/75 | HR 72 | Wt 174.7 lb

## 2017-11-27 DIAGNOSIS — O359XX0 Maternal care for (suspected) fetal abnormality and damage, unspecified, not applicable or unspecified: Secondary | ICD-10-CM

## 2017-11-27 DIAGNOSIS — Z8679 Personal history of other diseases of the circulatory system: Secondary | ICD-10-CM | POA: Diagnosis not present

## 2017-11-27 DIAGNOSIS — I313 Pericardial effusion (noninflammatory): Secondary | ICD-10-CM | POA: Insufficient documentation

## 2017-11-27 DIAGNOSIS — Z3A36 36 weeks gestation of pregnancy: Secondary | ICD-10-CM | POA: Diagnosis not present

## 2017-11-27 DIAGNOSIS — Z95810 Presence of automatic (implantable) cardiac defibrillator: Secondary | ICD-10-CM | POA: Diagnosis not present

## 2017-11-27 DIAGNOSIS — O365930 Maternal care for other known or suspected poor fetal growth, third trimester, not applicable or unspecified: Secondary | ICD-10-CM | POA: Diagnosis not present

## 2017-11-27 DIAGNOSIS — Z641 Problems related to multiparity: Secondary | ICD-10-CM | POA: Diagnosis not present

## 2017-11-27 DIAGNOSIS — Q633 Hyperplastic and giant kidney: Secondary | ICD-10-CM | POA: Insufficient documentation

## 2017-11-27 DIAGNOSIS — I4901 Ventricular fibrillation: Secondary | ICD-10-CM | POA: Diagnosis not present

## 2017-11-27 DIAGNOSIS — O321XX0 Maternal care for breech presentation, not applicable or unspecified: Secondary | ICD-10-CM | POA: Insufficient documentation

## 2017-11-27 DIAGNOSIS — O368330 Maternal care for abnormalities of the fetal heart rate or rhythm, third trimester, not applicable or unspecified: Secondary | ICD-10-CM | POA: Diagnosis not present

## 2017-11-27 DIAGNOSIS — O9942 Diseases of the circulatory system complicating childbirth: Secondary | ICD-10-CM | POA: Diagnosis not present

## 2017-11-27 DIAGNOSIS — O2690 Pregnancy related conditions, unspecified, unspecified trimester: Secondary | ICD-10-CM | POA: Diagnosis not present

## 2017-11-27 DIAGNOSIS — O0993 Supervision of high risk pregnancy, unspecified, third trimester: Secondary | ICD-10-CM | POA: Diagnosis present

## 2017-11-27 DIAGNOSIS — Q188 Other specified congenital malformations of face and neck: Secondary | ICD-10-CM | POA: Diagnosis not present

## 2017-11-27 DIAGNOSIS — O328XX1 Maternal care for other malpresentation of fetus, fetus 1: Secondary | ICD-10-CM | POA: Diagnosis not present

## 2017-11-27 DIAGNOSIS — O4103X0 Oligohydramnios, third trimester, not applicable or unspecified: Secondary | ICD-10-CM | POA: Diagnosis not present

## 2017-11-27 DIAGNOSIS — O0990 Supervision of high risk pregnancy, unspecified, unspecified trimester: Secondary | ICD-10-CM | POA: Insufficient documentation

## 2017-11-27 DIAGNOSIS — Z79899 Other long term (current) drug therapy: Secondary | ICD-10-CM | POA: Diagnosis not present

## 2017-11-27 DIAGNOSIS — O368930 Maternal care for other specified fetal problems, third trimester, not applicable or unspecified: Secondary | ICD-10-CM | POA: Diagnosis not present

## 2017-11-27 DIAGNOSIS — O358XX0 Maternal care for other (suspected) fetal abnormality and damage, not applicable or unspecified: Secondary | ICD-10-CM | POA: Insufficient documentation

## 2017-11-27 DIAGNOSIS — O99353 Diseases of the nervous system complicating pregnancy, third trimester: Secondary | ICD-10-CM | POA: Diagnosis not present

## 2017-11-27 DIAGNOSIS — Z302 Encounter for sterilization: Secondary | ICD-10-CM | POA: Diagnosis not present

## 2017-11-27 DIAGNOSIS — Q897 Multiple congenital malformations, not elsewhere classified: Secondary | ICD-10-CM | POA: Insufficient documentation

## 2017-11-27 DIAGNOSIS — I252 Old myocardial infarction: Secondary | ICD-10-CM | POA: Diagnosis not present

## 2017-11-27 DIAGNOSIS — O43893 Other placental disorders, third trimester: Secondary | ICD-10-CM | POA: Diagnosis not present

## 2017-11-27 DIAGNOSIS — G40909 Epilepsy, unspecified, not intractable, without status epilepticus: Secondary | ICD-10-CM | POA: Insufficient documentation

## 2017-11-27 DIAGNOSIS — O99350 Diseases of the nervous system complicating pregnancy, unspecified trimester: Secondary | ICD-10-CM | POA: Insufficient documentation

## 2017-11-27 DIAGNOSIS — F1011 Alcohol abuse, in remission: Secondary | ICD-10-CM | POA: Diagnosis not present

## 2017-11-27 DIAGNOSIS — O99324 Drug use complicating childbirth: Secondary | ICD-10-CM | POA: Diagnosis not present

## 2017-11-27 NOTE — Patient Instructions (Signed)
Vaginal Delivery Vaginal delivery means that you will give birth by pushing your baby out of your birth canal (vagina). A team of health care providers will help you before, during, and after vaginal delivery. Birth experiences are unique for every woman and every pregnancy, and birth experiences vary depending on where you choose to give birth. What should I do to prepare for my baby's birth? Before your baby is born, it is important to talk with your health care provider about:  Your labor and delivery preferences. These may include: ? Medicines that you may be given. ? How you will manage your pain. This might include non-medical pain relief techniques or injectable pain relief such as epidural analgesia. ? How you and your baby will be monitored during labor and delivery. ? Who may be in the labor and delivery room with you. ? Your feelings about surgical delivery of your baby (cesarean delivery, or C-section) if this becomes necessary. ? Your feelings about receiving donated blood through an IV tube (blood transfusion) if this becomes necessary.  Whether you are able: ? To take pictures or videos of the birth. ? To eat during labor and delivery. ? To move around, walk, or change positions during labor and delivery.  What to expect after your baby is born, such as: ? Whether delayed umbilical cord clamping and cutting is offered. ? Who will care for your baby right after birth. ? Medicines or tests that may be recommended for your baby. ? Whether breastfeeding is supported in your hospital or birth center. ? How long you will be in the hospital or birth center.  How any medical conditions you have may affect your baby or your labor and delivery experience.  To prepare for your baby's birth, you should also:  Attend all of your health care visits before delivery (prenatal visits) as recommended by your health care provider. This is important.  Prepare your home for your baby's  arrival. Make sure that you have: ? Diapers. ? Baby clothing. ? Feeding equipment. ? Safe sleeping arrangements for you and your baby.  Install a car seat in your vehicle. Have your car seat checked by a certified car seat installer to make sure that it is installed safely.  Think about who will help you with your new baby at home for at least the first several weeks after delivery.  What can I expect when I arrive at the birth center or hospital? Once you are in labor and have been admitted into the hospital or birth center, your health care provider may:  Review your pregnancy history and any concerns you have.  Insert an IV tube into one of your veins. This is used to give you fluids and medicines.  Check your blood pressure, pulse, temperature, and heart rate (vital signs).  Check whether your bag of water (amniotic sac) has broken (ruptured).  Talk with you about your birth plan and discuss pain control options.  Monitoring Your health care provider may monitor your contractions (uterine monitoring) and your baby's heart rate (fetal monitoring). You may need to be monitored:  Often, but not continuously (intermittently).  All the time or for long periods at a time (continuously). Continuous monitoring may be needed if: ? You are taking certain medicines, such as medicine to relieve pain or make your contractions stronger. ? You have pregnancy or labor complications.  Monitoring may be done by:  Placing a special stethoscope or a handheld monitoring device on your abdomen to   check your baby's heartbeat, and feeling your abdomen for contractions. This method of monitoring does not continuously record your baby's heartbeat or your contractions.  Placing monitors on your abdomen (external monitors) to record your baby's heartbeat and the frequency and length of contractions. You may not have to wear external monitors all the time.  Placing monitors inside of your uterus  (internal monitors) to record your baby's heartbeat and the frequency, length, and strength of your contractions. ? Your health care provider may use internal monitors if he or she needs more information about the strength of your contractions or your baby's heart rate. ? Internal monitors are put in place by passing a thin, flexible wire through your vagina and into your uterus. Depending on the type of monitor, it may remain in your uterus or on your baby's head until birth. ? Your health care provider will discuss the benefits and risks of internal monitoring with you and will ask for your permission before inserting the monitors.  Telemetry. This is a type of continuous monitoring that can be done with external or internal monitors. Instead of having to stay in bed, you are able to move around during telemetry. Ask your health care provider if telemetry is an option for you.  Physical exam Your health care provider may perform a physical exam. This may include:  Checking whether your baby is positioned: ? With the head toward your vagina (head-down). This is most common. ? With the head toward the top of your uterus (head-up or breech). If your baby is in a breech position, your health care provider may try to turn your baby to a head-down position so you can deliver vaginally. If it does not seem that your baby can be born vaginally, your provider may recommend surgery to deliver your baby. In rare cases, you may be able to deliver vaginally if your baby is head-up (breech delivery). ? Lying sideways (transverse). Babies that are lying sideways cannot be delivered vaginally.  Checking your cervix to determine: ? Whether it is thinning out (effacing). ? Whether it is opening up (dilating). ? How low your baby has moved into your birth canal.  What are the three stages of labor and delivery?  Normal labor and delivery is divided into the following three stages: Stage 1  Stage 1 is the  longest stage of labor, and it can last for hours or days. Stage 1 includes: ? Early labor. This is when contractions may be irregular, or regular and mild. Generally, early labor contractions are more than 10 minutes apart. ? Active labor. This is when contractions get longer, more regular, more frequent, and more intense. ? The transition phase. This is when contractions happen very close together, are very intense, and may last longer than during any other part of labor.  Contractions generally feel mild, infrequent, and irregular at first. They get stronger, more frequent (about every 2-3 minutes), and more regular as you progress from early labor through active labor and transition.  Many women progress through stage 1 naturally, but you may need help to continue making progress. If this happens, your health care provider may talk with you about: ? Rupturing your amniotic sac if it has not ruptured yet. ? Giving you medicine to help make your contractions stronger and more frequent.  Stage 1 ends when your cervix is completely dilated to 4 inches (10 cm) and completely effaced. This happens at the end of the transition phase. Stage 2  Once   your cervix is completely effaced and dilated to 4 inches (10 cm), you may start to feel an urge to push. It is common for the body to naturally take a rest before feeling the urge to push, especially if you received an epidural or certain other pain medicines. This rest period may last for up to 1-2 hours, depending on your unique labor experience.  During stage 2, contractions are generally less painful, because pushing helps relieve contraction pain. Instead of contraction pain, you may feel stretching and burning pain, especially when the widest part of your baby's head passes through the vaginal opening (crowning).  Your health care provider will closely monitor your pushing progress and your baby's progress through the vagina during stage 2.  Your  health care provider may massage the area of skin between your vaginal opening and anus (perineum) or apply warm compresses to your perineum. This helps it stretch as the baby's head starts to crown, which can help prevent perineal tearing. ? In some cases, an incision may be made in your perineum (episiotomy) to allow the baby to pass through the vaginal opening. An episiotomy helps to make the opening of the vagina larger to allow more room for the baby to fit through.  It is very important to breathe and focus so your health care provider can control the delivery of your baby's head. Your health care provider may have you decrease the intensity of your pushing, to help prevent perineal tearing.  After delivery of your baby's head, the shoulders and the rest of the body generally deliver very quickly and without difficulty.  Once your baby is delivered, the umbilical cord may be cut right away, or this may be delayed for 1-2 minutes, depending on your baby's health. This may vary among health care providers, hospitals, and birth centers.  If you and your baby are healthy enough, your baby may be placed on your chest or abdomen to help maintain the baby's temperature and to help you bond with each other. Some mothers and babies start breastfeeding at this time. Your health care team will dry your baby and help keep your baby warm during this time.  Your baby may need immediate care if he or she: ? Showed signs of distress during labor. ? Has a medical condition. ? Was born too early (prematurely). ? Had a bowel movement before birth (meconium). ? Shows signs of difficulty transitioning from being inside the uterus to being outside of the uterus. If you are planning to breastfeed, your health care team will help you begin a feeding. Stage 3  The third stage of labor starts immediately after the birth of your baby and ends after you deliver the placenta. The placenta is an organ that develops  during pregnancy to provide oxygen and nutrients to your baby in the womb.  Delivering the placenta may require some pushing, and you may have mild contractions. Breastfeeding can stimulate contractions to help you deliver the placenta.  After the placenta is delivered, your uterus should tighten (contract) and become firm. This helps to stop bleeding in your uterus. To help your uterus contract and to control bleeding, your health care provider may: ? Give you medicine by injection, through an IV tube, by mouth, or through your rectum (rectally). ? Massage your abdomen or perform a vaginal exam to remove any blood clots that are left in your uterus. ? Empty your bladder by placing a thin, flexible tube (catheter) into your bladder. ? Encourage   you to breastfeed your baby. After labor is over, you and your baby will be monitored closely to ensure that you are both healthy until you are ready to go home. Your health care team will teach you how to care for yourself and your baby. This information is not intended to replace advice given to you by your health care provider. Make sure you discuss any questions you have with your health care provider. Document Released: 08/15/2008 Document Revised: 05/26/2016 Document Reviewed: 11/21/2015 Elsevier Interactive Patient Education  2018 Elsevier Inc.  

## 2017-11-27 NOTE — Progress Notes (Signed)
Subjective:  Elaysha Bevard is a 37 y.o. [id number] at [redacted]w[redacted]d being seen today for ongoing prenatal care.  She is currently monitored for the following issues for this high-risk pregnancy and has Cardiac arrest ([place]); Ventricular fibrillation ([place]); Supervision of high risk pregnancy, antepartum; History of alcohol abuse; Grand multipara; and Pregnancy complicated by multiple fetal congenital anomalies, single gestation on their problem list.  Patient reports no complaints.  Contractions: Not present. Vag. Bleeding: None.  Movement: Present. Denies leaking of fluid.   The following portions of the patient's history were reviewed and updated as appropriate: allergies, current medications, past family history, past medical history, past social history, past surgical history and problem list. Problem list updated.  Objective:   Vitals:   11/27/17 0829  BP: 119/75  Pulse: 72  Weight: 79.2 kg (174 lb 11.2 oz)    Fetal Status: Fetal Heart Rate (bpm): 149 Fundal Height: 36 cm Movement: Present     General:  Alert, oriented and cooperative. Patient is in no acute distress.  Skin: Skin is warm and dry. No rash noted.   Cardiovascular: Normal heart rate noted  Respiratory: Normal respiratory effort, no problems with respiration noted  Abdomen: Soft, gravid, appropriate for gestational age. Pain/Pressure: Present     Pelvic:  Cervical exam performed        Extremities: Normal range of motion.  Edema: Trace  Mental Status: Normal mood and affect. Normal behavior. Normal judgment and thought content.   Urinalysis:      Assessment and Plan:  Pregnancy: [id number] at [redacted]w[redacted]d  1. Supervision of high risk pregnancy, antepartum Labor precautions - Strep Gp B NAA - Cervicovaginal ancillary only - AMB referral to maternal fetal medicine  2. Pregnancy complicated by multiple fetal congenital anomalies, single gestation Panorama LR Pt's phone was not working last few weeks. Has been corrected. She also is  going to add her mother's # for additional contact source Have called [facility] for NICU tour Will also consult Dr Tamala Julian U/S and MFM consult to formulate delivery plan - AMB referral to maternal fetal medicine  3. Grand multipara Desires BTL, papers signed 11/05/17  4. Ventricular fibrillation ([place]) ICD in place   Term labor symptoms and general obstetric precautions including but not limited to vaginal bleeding, contractions, leaking of fluid and fetal movement were reviewed in detail with the patient. Please refer to After Visit Summary for other counseling recommendations.  Return in about 1 week (around 12/04/2017) for OB visit.   Chancy Milroy, MD

## 2017-11-27 NOTE — Addendum Note (Signed)
Addended by: Riccardo Dubin on: 11/27/2017 09:27 AM   Modules accepted: Orders

## 2017-11-28 DIAGNOSIS — O368930 Maternal care for other specified fetal problems, third trimester, not applicable or unspecified: Secondary | ICD-10-CM | POA: Diagnosis present

## 2017-11-28 DIAGNOSIS — Z79899 Other long term (current) drug therapy: Secondary | ICD-10-CM | POA: Diagnosis not present

## 2017-11-28 DIAGNOSIS — O321XX0 Maternal care for breech presentation, not applicable or unspecified: Secondary | ICD-10-CM | POA: Diagnosis present

## 2017-11-28 DIAGNOSIS — Z95810 Presence of automatic (implantable) cardiac defibrillator: Secondary | ICD-10-CM | POA: Diagnosis not present

## 2017-11-28 DIAGNOSIS — Z302 Encounter for sterilization: Secondary | ICD-10-CM | POA: Diagnosis not present

## 2017-11-28 DIAGNOSIS — Z3A36 36 weeks gestation of pregnancy: Secondary | ICD-10-CM | POA: Diagnosis not present

## 2017-11-28 DIAGNOSIS — Z8679 Personal history of other diseases of the circulatory system: Secondary | ICD-10-CM | POA: Diagnosis not present

## 2017-11-28 DIAGNOSIS — O368330 Maternal care for abnormalities of the fetal heart rate or rhythm, third trimester, not applicable or unspecified: Secondary | ICD-10-CM | POA: Diagnosis present

## 2017-11-28 DIAGNOSIS — O4103X0 Oligohydramnios, third trimester, not applicable or unspecified: Secondary | ICD-10-CM | POA: Diagnosis present

## 2017-11-28 LAB — CERVICOVAGINAL ANCILLARY ONLY
Chlamydia: NEGATIVE
Neisseria Gonorrhea: NEGATIVE

## 2017-11-28 MED ORDER — GENERIC EXTERNAL MEDICATION
Status: DC
Start: ? — End: 2017-11-28

## 2017-11-28 MED ORDER — HYDROCODONE-ACETAMINOPHEN 5-325 MG PO TABS
ORAL_TABLET | ORAL | Status: DC
Start: ? — End: 2017-11-28

## 2017-11-28 MED ORDER — GUAIFENESIN 100 MG/5ML PO LIQD
200.00 | ORAL | Status: DC
Start: ? — End: 2017-11-28

## 2017-11-28 MED ORDER — ACETAMINOPHEN 325 MG PO TABS
650.00 | ORAL_TABLET | ORAL | Status: DC
Start: 2017-11-28 — End: 2017-11-28

## 2017-11-28 MED ORDER — MORPHINE SULFATE (PF) 10 MG/ML IV SOLN
10.00 | INTRAVENOUS | Status: DC
Start: ? — End: 2017-11-28

## 2017-11-28 MED ORDER — LANOLIN EX OINT
TOPICAL_OINTMENT | CUTANEOUS | Status: DC
Start: ? — End: 2017-11-28

## 2017-11-28 MED ORDER — BENZOCAINE-MENTHOL 20-0.5 % EX AERO
INHALATION_SPRAY | CUTANEOUS | Status: DC
Start: ? — End: 2017-11-28

## 2017-11-28 MED ORDER — DOCUSATE SODIUM 100 MG PO CAPS
100.00 | ORAL_CAPSULE | ORAL | Status: DC
Start: 2017-11-28 — End: 2017-11-28

## 2017-11-28 MED ORDER — BENZOCAINE-MENTHOL 15-3.6 MG MT LOZG
LOZENGE | OROMUCOSAL | Status: DC
Start: ? — End: 2017-11-28

## 2017-11-28 MED ORDER — HYDROMORPHONE HCL 1 MG/ML IJ SOLN
1.00 | INTRAMUSCULAR | Status: DC
Start: ? — End: 2017-11-28

## 2017-11-28 MED ORDER — SIMETHICONE 80 MG PO CHEW
80.00 | CHEWABLE_TABLET | ORAL | Status: DC
Start: ? — End: 2017-11-28

## 2017-11-28 MED ORDER — ANTACID & ANTIGAS 200-200-20 MG/5ML PO SUSP
30.00 | ORAL | Status: DC
Start: ? — End: 2017-11-28

## 2017-11-28 MED ORDER — SALINE NASAL SPRAY 0.65 % NA SOLN
NASAL | Status: DC
Start: ? — End: 2017-11-28

## 2017-11-28 MED ORDER — BISACODYL 10 MG RE SUPP
10.00 | RECTAL | Status: DC
Start: ? — End: 2017-11-28

## 2017-11-28 MED ORDER — MORPHINE SULFATE (PF) 4 MG/ML IV SOLN
4.00 | INTRAVENOUS | Status: DC
Start: ? — End: 2017-11-28

## 2017-11-28 MED ORDER — METOPROLOL TARTRATE 50 MG PO TABS
25.00 | ORAL_TABLET | ORAL | Status: DC
Start: 2017-11-28 — End: 2017-11-28

## 2017-11-28 MED ORDER — IBUPROFEN 600 MG PO TABS
600.00 | ORAL_TABLET | ORAL | Status: DC
Start: 2017-11-28 — End: 2017-11-28

## 2017-11-28 MED ORDER — PRENATAL VITAMINS 28-0.8 MG PO TABS
ORAL_TABLET | ORAL | Status: DC
Start: 2017-11-29 — End: 2017-11-28

## 2017-11-28 MED ORDER — MAGNESIUM HYDROXIDE 400 MG/5ML PO SUSP
30.00 | ORAL | Status: DC
Start: ? — End: 2017-11-28

## 2017-11-28 MED ORDER — MEASLES, MUMPS & RUBELLA VAC ~~LOC~~ INJ
.50 | INJECTION | SUBCUTANEOUS | Status: DC
Start: ? — End: 2017-11-28

## 2017-11-28 MED ORDER — HYDROCODONE-ACETAMINOPHEN 10-325 MG PO TABS
ORAL_TABLET | ORAL | Status: DC
Start: ? — End: 2017-11-28

## 2017-11-28 MED ORDER — TETANUS-DIPHTH-ACELL PERTUSSIS 5-2-15.5 LF-MCG/0.5 IM SUSP
.50 | INTRAMUSCULAR | Status: DC
Start: ? — End: 2017-11-28

## 2017-11-28 MED ORDER — OXYCODONE HCL 5 MG PO TABS
10.00 | ORAL_TABLET | ORAL | Status: DC
Start: ? — End: 2017-11-28

## 2017-11-29 ENCOUNTER — Telehealth: Payer: Self-pay | Admitting: *Deleted

## 2017-11-29 LAB — STREP GP B NAA: Strep Gp B NAA: NEGATIVE

## 2017-11-29 NOTE — Telephone Encounter (Signed)
Received a phone call from Raquel Sarna at [facility] stating Dr.Smith ( at [place]) states this patient does not need a Comprehensive care consult ; only a palliative care consult.  In discussion with Raquel Sarna and in review of chart saw that an order has already been put in for palliative care consult. Raquel Sarna states we need to cancel the comprehensive care consult. I informed her I would route to provider who ordered the consult.

## 2017-12-03 ENCOUNTER — Encounter: Payer: Medicare Other | Admitting: Obstetrics and Gynecology

## 2017-12-05 ENCOUNTER — Encounter: Payer: Medicare Other | Admitting: Obstetrics and Gynecology

## 2017-12-06 ENCOUNTER — Encounter: Payer: Self-pay | Admitting: Obstetrics and Gynecology

## 2017-12-06 ENCOUNTER — Ambulatory Visit (INDEPENDENT_AMBULATORY_CARE_PROVIDER_SITE_OTHER): Payer: Medicaid Other | Admitting: Obstetrics and Gynecology

## 2017-12-06 DIAGNOSIS — I428 Other cardiomyopathies: Secondary | ICD-10-CM | POA: Diagnosis not present

## 2017-12-06 DIAGNOSIS — Z8674 Personal history of sudden cardiac arrest: Secondary | ICD-10-CM | POA: Diagnosis not present

## 2017-12-06 NOTE — Progress Notes (Signed)
Center for [facility] 12/06/2017  CC: regular incision check   Subjective:    S/p 11/27/2017 pLTCS and BTL at [facility] due to non reassuring BPP, breech and NICU full here at [facility]. Patient discharged to home on pod#3 after uncomplicated post op course. Unfortunately, the newborn did pass; it had known multiple anomalies Pt continuing to meet all postop goals  Objective:    BP 110/62   Pulse 63   LMP 05/07/2017 (LMP Unknown)   Breastfeeding? Unknown  NAD Abdomen: soft, nttp, nd. C/d/i incision with steri strips in place Assessment:    Doing well postoperatively.    Plan:   Routine care. D/w pt to remove steri strips when she takes a shower BTL path confirmatory RTC 2wks for pp visit and mood check. EPDS 11 with no thoughts of SI; pt declines seeing Roselyn Reef of [place] today  Continue metop and pt states she sees cards today.   Durene Romans MD Attending Center for [facility] (Faculty Practice) 12/06/2017 Time: [phone]

## 2017-12-06 NOTE — Progress Notes (Signed)
Pt had c-section on 11/27/17. Baby has passed. No problems, minimal pain. Declines to see [facility] today.

## 2017-12-11 ENCOUNTER — Encounter: Payer: Self-pay | Admitting: *Deleted

## 2017-12-11 ENCOUNTER — Telehealth: Payer: Self-pay | Admitting: *Deleted

## 2017-12-11 DIAGNOSIS — Z4502 Encounter for adjustment and management of automatic implantable cardiac defibrillator: Secondary | ICD-10-CM | POA: Diagnosis not present

## 2017-12-11 DIAGNOSIS — Z95810 Presence of automatic (implantable) cardiac defibrillator: Secondary | ICD-10-CM | POA: Diagnosis not present

## 2017-12-11 DIAGNOSIS — Z8674 Personal history of sudden cardiac arrest: Secondary | ICD-10-CM | POA: Diagnosis not present

## 2017-12-11 NOTE — Telephone Encounter (Signed)
Patient called front desk, c/o pain in her vagina. Had baby via c/s on 11/27/17. Would like to know what she can take.

## 2017-12-12 ENCOUNTER — Encounter: Payer: Medicare Other | Admitting: Obstetrics and Gynecology

## 2017-12-13 NOTE — Telephone Encounter (Signed)
Called patient and asked if she was given medication to go home with. Patient states she was given hydrocodone and ibuprofen. Patient states her heart doctor doesn't want her taking ibuprofen and she is out of hydrocodone. Patient reports occasional cramping. Recommended extra strength tylenol as that should be fine to take as well as a heating pad. Patient verbalized understanding and had no questions

## 2017-12-17 ENCOUNTER — Encounter: Payer: Medicare Other | Admitting: Obstetrics and Gynecology

## 2017-12-18 DIAGNOSIS — I428 Other cardiomyopathies: Secondary | ICD-10-CM | POA: Diagnosis not present

## 2017-12-21 DIAGNOSIS — R001 Bradycardia, unspecified: Secondary | ICD-10-CM | POA: Diagnosis not present

## 2017-12-21 DIAGNOSIS — Z8674 Personal history of sudden cardiac arrest: Secondary | ICD-10-CM | POA: Diagnosis not present

## 2017-12-21 DIAGNOSIS — I428 Other cardiomyopathies: Secondary | ICD-10-CM | POA: Diagnosis not present

## 2017-12-24 ENCOUNTER — Encounter: Payer: Medicare Other | Admitting: Obstetrics and Gynecology

## 2017-12-24 ENCOUNTER — Ambulatory Visit: Payer: Medicare Other | Admitting: Nurse Practitioner

## 2017-12-26 ENCOUNTER — Encounter: Payer: Medicare Other | Admitting: Obstetrics and Gynecology

## 2018-01-02 ENCOUNTER — Encounter: Payer: Medicare Other | Admitting: Obstetrics and Gynecology

## 2018-01-04 DIAGNOSIS — I428 Other cardiomyopathies: Secondary | ICD-10-CM | POA: Diagnosis not present

## 2018-01-09 ENCOUNTER — Encounter: Payer: Medicare Other | Admitting: Obstetrics and Gynecology

## 2018-01-11 DIAGNOSIS — Z8759 Personal history of other complications of pregnancy, childbirth and the puerperium: Secondary | ICD-10-CM | POA: Diagnosis not present

## 2018-01-11 DIAGNOSIS — Z8674 Personal history of sudden cardiac arrest: Secondary | ICD-10-CM | POA: Diagnosis not present

## 2018-01-11 DIAGNOSIS — I428 Other cardiomyopathies: Secondary | ICD-10-CM | POA: Diagnosis not present

## 2018-01-11 DIAGNOSIS — R001 Bradycardia, unspecified: Secondary | ICD-10-CM | POA: Diagnosis not present

## 2018-01-16 ENCOUNTER — Encounter: Payer: Medicare Other | Admitting: Obstetrics and Gynecology

## 2018-02-08 DIAGNOSIS — R001 Bradycardia, unspecified: Secondary | ICD-10-CM | POA: Diagnosis not present

## 2018-02-08 DIAGNOSIS — I428 Other cardiomyopathies: Secondary | ICD-10-CM | POA: Diagnosis not present

## 2018-02-08 DIAGNOSIS — Z8759 Personal history of other complications of pregnancy, childbirth and the puerperium: Secondary | ICD-10-CM | POA: Diagnosis not present

## 2018-02-08 DIAGNOSIS — I493 Ventricular premature depolarization: Secondary | ICD-10-CM | POA: Diagnosis not present

## 2018-02-08 DIAGNOSIS — Z8674 Personal history of sudden cardiac arrest: Secondary | ICD-10-CM | POA: Diagnosis not present

## 2018-03-12 DIAGNOSIS — Z95810 Presence of automatic (implantable) cardiac defibrillator: Secondary | ICD-10-CM | POA: Diagnosis not present

## 2018-03-12 DIAGNOSIS — Z4502 Encounter for adjustment and management of automatic implantable cardiac defibrillator: Secondary | ICD-10-CM | POA: Diagnosis not present

## 2018-06-11 DIAGNOSIS — Z4502 Encounter for adjustment and management of automatic implantable cardiac defibrillator: Secondary | ICD-10-CM | POA: Diagnosis not present

## 2018-06-11 DIAGNOSIS — Z95810 Presence of automatic (implantable) cardiac defibrillator: Secondary | ICD-10-CM | POA: Diagnosis not present

## 2018-09-10 DIAGNOSIS — Z4502 Encounter for adjustment and management of automatic implantable cardiac defibrillator: Secondary | ICD-10-CM | POA: Diagnosis not present

## 2018-09-10 DIAGNOSIS — Z8674 Personal history of sudden cardiac arrest: Secondary | ICD-10-CM | POA: Diagnosis not present

## 2018-09-10 DIAGNOSIS — Z95810 Presence of automatic (implantable) cardiac defibrillator: Secondary | ICD-10-CM | POA: Diagnosis not present

## 2018-10-02 DIAGNOSIS — I428 Other cardiomyopathies: Secondary | ICD-10-CM | POA: Diagnosis not present

## 2018-10-02 DIAGNOSIS — Z95810 Presence of automatic (implantable) cardiac defibrillator: Secondary | ICD-10-CM | POA: Diagnosis not present

## 2018-10-02 DIAGNOSIS — Z0189 Encounter for other specified special examinations: Secondary | ICD-10-CM | POA: Diagnosis not present

## 2018-10-02 DIAGNOSIS — Z4502 Encounter for adjustment and management of automatic implantable cardiac defibrillator: Secondary | ICD-10-CM | POA: Diagnosis not present

## 2018-10-02 DIAGNOSIS — Z8674 Personal history of sudden cardiac arrest: Secondary | ICD-10-CM | POA: Diagnosis not present

## 2018-11-07 DIAGNOSIS — N6310 Unspecified lump in the right breast, unspecified quadrant: Secondary | ICD-10-CM | POA: Diagnosis not present

## 2018-11-07 DIAGNOSIS — N6313 Unspecified lump in the right breast, lower outer quadrant: Secondary | ICD-10-CM | POA: Diagnosis not present

## 2018-11-07 DIAGNOSIS — R928 Other abnormal and inconclusive findings on diagnostic imaging of breast: Secondary | ICD-10-CM | POA: Diagnosis not present

## 2018-11-07 DIAGNOSIS — Z87891 Personal history of nicotine dependence: Secondary | ICD-10-CM | POA: Diagnosis not present

## 2018-11-07 DIAGNOSIS — Z950 Presence of cardiac pacemaker: Secondary | ICD-10-CM | POA: Diagnosis not present

## 2018-11-07 DIAGNOSIS — Z79899 Other long term (current) drug therapy: Secondary | ICD-10-CM | POA: Diagnosis not present

## 2018-11-20 HISTORY — PX: BREAST LUMPECTOMY: SHX2

## 2018-12-09 DIAGNOSIS — C50911 Malignant neoplasm of unspecified site of right female breast: Secondary | ICD-10-CM | POA: Insufficient documentation

## 2018-12-09 DIAGNOSIS — Z1501 Genetic susceptibility to malignant neoplasm of breast: Secondary | ICD-10-CM | POA: Insufficient documentation

## 2018-12-10 DIAGNOSIS — Z4502 Encounter for adjustment and management of automatic implantable cardiac defibrillator: Secondary | ICD-10-CM | POA: Diagnosis not present

## 2018-12-10 DIAGNOSIS — Z95810 Presence of automatic (implantable) cardiac defibrillator: Secondary | ICD-10-CM | POA: Diagnosis not present

## 2018-12-10 DIAGNOSIS — Z8674 Personal history of sudden cardiac arrest: Secondary | ICD-10-CM | POA: Diagnosis not present

## 2018-12-12 DIAGNOSIS — N6313 Unspecified lump in the right breast, lower outer quadrant: Secondary | ICD-10-CM | POA: Diagnosis not present

## 2018-12-12 DIAGNOSIS — N6314 Unspecified lump in the right breast, lower inner quadrant: Secondary | ICD-10-CM | POA: Diagnosis not present

## 2018-12-19 DIAGNOSIS — Z79899 Other long term (current) drug therapy: Secondary | ICD-10-CM | POA: Diagnosis not present

## 2018-12-19 DIAGNOSIS — Z87891 Personal history of nicotine dependence: Secondary | ICD-10-CM | POA: Diagnosis not present

## 2018-12-19 DIAGNOSIS — Z950 Presence of cardiac pacemaker: Secondary | ICD-10-CM | POA: Diagnosis not present

## 2018-12-19 DIAGNOSIS — R928 Other abnormal and inconclusive findings on diagnostic imaging of breast: Secondary | ICD-10-CM | POA: Diagnosis not present

## 2018-12-19 DIAGNOSIS — N6310 Unspecified lump in the right breast, unspecified quadrant: Secondary | ICD-10-CM | POA: Diagnosis not present

## 2018-12-20 DIAGNOSIS — R590 Localized enlarged lymph nodes: Secondary | ICD-10-CM | POA: Diagnosis not present

## 2018-12-20 DIAGNOSIS — R928 Other abnormal and inconclusive findings on diagnostic imaging of breast: Secondary | ICD-10-CM | POA: Diagnosis not present

## 2018-12-20 DIAGNOSIS — C773 Secondary and unspecified malignant neoplasm of axilla and upper limb lymph nodes: Secondary | ICD-10-CM | POA: Diagnosis not present

## 2018-12-20 DIAGNOSIS — C50911 Malignant neoplasm of unspecified site of right female breast: Secondary | ICD-10-CM | POA: Diagnosis not present

## 2018-12-20 DIAGNOSIS — N6315 Unspecified lump in the right breast, overlapping quadrants: Secondary | ICD-10-CM | POA: Diagnosis not present

## 2018-12-20 DIAGNOSIS — N6313 Unspecified lump in the right breast, lower outer quadrant: Secondary | ICD-10-CM | POA: Diagnosis not present

## 2018-12-21 DIAGNOSIS — C50919 Malignant neoplasm of unspecified site of unspecified female breast: Secondary | ICD-10-CM

## 2018-12-21 HISTORY — DX: Malignant neoplasm of unspecified site of unspecified female breast: C50.919

## 2018-12-31 DIAGNOSIS — Z171 Estrogen receptor negative status [ER-]: Secondary | ICD-10-CM | POA: Diagnosis not present

## 2018-12-31 DIAGNOSIS — C50911 Malignant neoplasm of unspecified site of right female breast: Secondary | ICD-10-CM | POA: Diagnosis not present

## 2018-12-31 DIAGNOSIS — R590 Localized enlarged lymph nodes: Secondary | ICD-10-CM | POA: Diagnosis not present

## 2018-12-31 DIAGNOSIS — M899 Disorder of bone, unspecified: Secondary | ICD-10-CM | POA: Diagnosis not present

## 2018-12-31 DIAGNOSIS — N329 Bladder disorder, unspecified: Secondary | ICD-10-CM | POA: Diagnosis not present

## 2018-12-31 DIAGNOSIS — R9341 Abnormal radiologic findings on diagnostic imaging of renal pelvis, ureter, or bladder: Secondary | ICD-10-CM | POA: Diagnosis not present

## 2018-12-31 DIAGNOSIS — R599 Enlarged lymph nodes, unspecified: Secondary | ICD-10-CM | POA: Diagnosis not present

## 2019-01-03 DIAGNOSIS — Z171 Estrogen receptor negative status [ER-]: Secondary | ICD-10-CM | POA: Diagnosis not present

## 2019-01-03 DIAGNOSIS — C773 Secondary and unspecified malignant neoplasm of axilla and upper limb lymph nodes: Secondary | ICD-10-CM | POA: Diagnosis not present

## 2019-01-03 DIAGNOSIS — Z87891 Personal history of nicotine dependence: Secondary | ICD-10-CM | POA: Diagnosis not present

## 2019-01-03 DIAGNOSIS — C50811 Malignant neoplasm of overlapping sites of right female breast: Secondary | ICD-10-CM | POA: Diagnosis not present

## 2019-01-03 DIAGNOSIS — Z5181 Encounter for therapeutic drug level monitoring: Secondary | ICD-10-CM | POA: Diagnosis not present

## 2019-01-03 DIAGNOSIS — Z79899 Other long term (current) drug therapy: Secondary | ICD-10-CM | POA: Diagnosis not present

## 2019-01-03 DIAGNOSIS — Z8674 Personal history of sudden cardiac arrest: Secondary | ICD-10-CM | POA: Diagnosis not present

## 2019-01-03 DIAGNOSIS — I469 Cardiac arrest, cause unspecified: Secondary | ICD-10-CM | POA: Diagnosis not present

## 2019-01-03 DIAGNOSIS — C50911 Malignant neoplasm of unspecified site of right female breast: Secondary | ICD-10-CM | POA: Diagnosis not present

## 2019-01-03 DIAGNOSIS — Z950 Presence of cardiac pacemaker: Secondary | ICD-10-CM | POA: Diagnosis not present

## 2019-01-08 ENCOUNTER — Other Ambulatory Visit: Payer: Self-pay | Admitting: Cardiology

## 2019-01-13 DIAGNOSIS — Z452 Encounter for adjustment and management of vascular access device: Secondary | ICD-10-CM | POA: Diagnosis not present

## 2019-01-13 DIAGNOSIS — C50911 Malignant neoplasm of unspecified site of right female breast: Secondary | ICD-10-CM | POA: Diagnosis not present

## 2019-01-13 DIAGNOSIS — Z79899 Other long term (current) drug therapy: Secondary | ICD-10-CM | POA: Diagnosis not present

## 2019-01-17 DIAGNOSIS — Z7689 Persons encountering health services in other specified circumstances: Secondary | ICD-10-CM | POA: Diagnosis not present

## 2019-01-17 DIAGNOSIS — C50811 Malignant neoplasm of overlapping sites of right female breast: Secondary | ICD-10-CM | POA: Diagnosis not present

## 2019-01-17 DIAGNOSIS — Z5111 Encounter for antineoplastic chemotherapy: Secondary | ICD-10-CM | POA: Diagnosis not present

## 2019-01-17 DIAGNOSIS — Z171 Estrogen receptor negative status [ER-]: Secondary | ICD-10-CM | POA: Diagnosis not present

## 2019-02-07 DIAGNOSIS — N96 Recurrent pregnancy loss: Secondary | ICD-10-CM | POA: Diagnosis not present

## 2019-02-07 DIAGNOSIS — C50811 Malignant neoplasm of overlapping sites of right female breast: Secondary | ICD-10-CM | POA: Diagnosis not present

## 2019-02-07 DIAGNOSIS — Z87891 Personal history of nicotine dependence: Secondary | ICD-10-CM | POA: Diagnosis not present

## 2019-02-07 DIAGNOSIS — Z9851 Tubal ligation status: Secondary | ICD-10-CM | POA: Diagnosis not present

## 2019-02-07 DIAGNOSIS — R110 Nausea: Secondary | ICD-10-CM | POA: Diagnosis not present

## 2019-02-07 DIAGNOSIS — C773 Secondary and unspecified malignant neoplasm of axilla and upper limb lymph nodes: Secondary | ICD-10-CM | POA: Diagnosis not present

## 2019-02-07 DIAGNOSIS — G893 Neoplasm related pain (acute) (chronic): Secondary | ICD-10-CM | POA: Diagnosis not present

## 2019-02-07 DIAGNOSIS — Z8674 Personal history of sudden cardiac arrest: Secondary | ICD-10-CM | POA: Diagnosis not present

## 2019-02-07 DIAGNOSIS — Z8669 Personal history of other diseases of the nervous system and sense organs: Secondary | ICD-10-CM | POA: Diagnosis not present

## 2019-02-07 DIAGNOSIS — Z171 Estrogen receptor negative status [ER-]: Secondary | ICD-10-CM | POA: Diagnosis not present

## 2019-02-07 DIAGNOSIS — T451X5A Adverse effect of antineoplastic and immunosuppressive drugs, initial encounter: Secondary | ICD-10-CM | POA: Diagnosis not present

## 2019-02-07 DIAGNOSIS — F1011 Alcohol abuse, in remission: Secondary | ICD-10-CM | POA: Diagnosis not present

## 2019-02-07 DIAGNOSIS — Z95810 Presence of automatic (implantable) cardiac defibrillator: Secondary | ICD-10-CM | POA: Diagnosis not present

## 2019-02-07 DIAGNOSIS — Z79899 Other long term (current) drug therapy: Secondary | ICD-10-CM | POA: Diagnosis not present

## 2019-02-18 DIAGNOSIS — R21 Rash and other nonspecific skin eruption: Secondary | ICD-10-CM | POA: Diagnosis not present

## 2019-02-18 DIAGNOSIS — T7840XA Allergy, unspecified, initial encounter: Secondary | ICD-10-CM | POA: Diagnosis not present

## 2019-02-19 DIAGNOSIS — N3001 Acute cystitis with hematuria: Secondary | ICD-10-CM | POA: Diagnosis not present

## 2019-02-19 DIAGNOSIS — B9689 Other specified bacterial agents as the cause of diseases classified elsewhere: Secondary | ICD-10-CM | POA: Diagnosis not present

## 2019-02-28 DIAGNOSIS — Z87891 Personal history of nicotine dependence: Secondary | ICD-10-CM | POA: Diagnosis not present

## 2019-02-28 DIAGNOSIS — C50811 Malignant neoplasm of overlapping sites of right female breast: Secondary | ICD-10-CM | POA: Diagnosis not present

## 2019-02-28 DIAGNOSIS — Z171 Estrogen receptor negative status [ER-]: Secondary | ICD-10-CM | POA: Diagnosis not present

## 2019-02-28 DIAGNOSIS — N3001 Acute cystitis with hematuria: Secondary | ICD-10-CM | POA: Diagnosis not present

## 2019-03-07 DIAGNOSIS — Z87891 Personal history of nicotine dependence: Secondary | ICD-10-CM | POA: Diagnosis not present

## 2019-03-07 DIAGNOSIS — Z171 Estrogen receptor negative status [ER-]: Secondary | ICD-10-CM | POA: Diagnosis not present

## 2019-03-07 DIAGNOSIS — Z5111 Encounter for antineoplastic chemotherapy: Secondary | ICD-10-CM | POA: Diagnosis not present

## 2019-03-07 DIAGNOSIS — Z7689 Persons encountering health services in other specified circumstances: Secondary | ICD-10-CM | POA: Diagnosis not present

## 2019-03-07 DIAGNOSIS — N3091 Cystitis, unspecified with hematuria: Secondary | ICD-10-CM | POA: Diagnosis not present

## 2019-03-07 DIAGNOSIS — C50811 Malignant neoplasm of overlapping sites of right female breast: Secondary | ICD-10-CM | POA: Diagnosis not present

## 2019-03-11 DIAGNOSIS — Z95810 Presence of automatic (implantable) cardiac defibrillator: Secondary | ICD-10-CM | POA: Diagnosis not present

## 2019-03-11 DIAGNOSIS — Z8674 Personal history of sudden cardiac arrest: Secondary | ICD-10-CM | POA: Diagnosis not present

## 2019-03-11 DIAGNOSIS — Z4502 Encounter for adjustment and management of automatic implantable cardiac defibrillator: Secondary | ICD-10-CM | POA: Diagnosis not present

## 2019-03-13 ENCOUNTER — Other Ambulatory Visit: Payer: Self-pay | Admitting: Cardiology

## 2019-03-28 DIAGNOSIS — Z95810 Presence of automatic (implantable) cardiac defibrillator: Secondary | ICD-10-CM | POA: Diagnosis not present

## 2019-03-28 DIAGNOSIS — Z7689 Persons encountering health services in other specified circumstances: Secondary | ICD-10-CM | POA: Diagnosis not present

## 2019-03-28 DIAGNOSIS — Z171 Estrogen receptor negative status [ER-]: Secondary | ICD-10-CM | POA: Diagnosis not present

## 2019-03-28 DIAGNOSIS — Z5111 Encounter for antineoplastic chemotherapy: Secondary | ICD-10-CM | POA: Diagnosis not present

## 2019-03-28 DIAGNOSIS — C50811 Malignant neoplasm of overlapping sites of right female breast: Secondary | ICD-10-CM | POA: Diagnosis not present

## 2019-04-18 DIAGNOSIS — R609 Edema, unspecified: Secondary | ICD-10-CM | POA: Diagnosis not present

## 2019-04-18 DIAGNOSIS — N3091 Cystitis, unspecified with hematuria: Secondary | ICD-10-CM | POA: Diagnosis not present

## 2019-04-18 DIAGNOSIS — G893 Neoplasm related pain (acute) (chronic): Secondary | ICD-10-CM | POA: Diagnosis not present

## 2019-04-18 DIAGNOSIS — C50811 Malignant neoplasm of overlapping sites of right female breast: Secondary | ICD-10-CM | POA: Diagnosis not present

## 2019-04-18 DIAGNOSIS — Z7689 Persons encountering health services in other specified circumstances: Secondary | ICD-10-CM | POA: Diagnosis not present

## 2019-04-18 DIAGNOSIS — Z87891 Personal history of nicotine dependence: Secondary | ICD-10-CM | POA: Diagnosis not present

## 2019-04-18 DIAGNOSIS — Z171 Estrogen receptor negative status [ER-]: Secondary | ICD-10-CM | POA: Diagnosis not present

## 2019-04-18 DIAGNOSIS — K5900 Constipation, unspecified: Secondary | ICD-10-CM | POA: Diagnosis not present

## 2019-04-18 DIAGNOSIS — Z95810 Presence of automatic (implantable) cardiac defibrillator: Secondary | ICD-10-CM | POA: Diagnosis not present

## 2019-04-18 DIAGNOSIS — Z5111 Encounter for antineoplastic chemotherapy: Secondary | ICD-10-CM | POA: Diagnosis not present

## 2019-05-09 DIAGNOSIS — N3090 Cystitis, unspecified without hematuria: Secondary | ICD-10-CM | POA: Diagnosis not present

## 2019-05-09 DIAGNOSIS — Z1502 Genetic susceptibility to malignant neoplasm of ovary: Secondary | ICD-10-CM | POA: Diagnosis not present

## 2019-05-09 DIAGNOSIS — Z8679 Personal history of other diseases of the circulatory system: Secondary | ICD-10-CM | POA: Diagnosis not present

## 2019-05-09 DIAGNOSIS — Z7689 Persons encountering health services in other specified circumstances: Secondary | ICD-10-CM | POA: Diagnosis not present

## 2019-05-09 DIAGNOSIS — Z87891 Personal history of nicotine dependence: Secondary | ICD-10-CM | POA: Diagnosis not present

## 2019-05-09 DIAGNOSIS — Z95810 Presence of automatic (implantable) cardiac defibrillator: Secondary | ICD-10-CM | POA: Diagnosis not present

## 2019-05-09 DIAGNOSIS — Z1501 Genetic susceptibility to malignant neoplasm of breast: Secondary | ICD-10-CM | POA: Diagnosis not present

## 2019-05-09 DIAGNOSIS — C773 Secondary and unspecified malignant neoplasm of axilla and upper limb lymph nodes: Secondary | ICD-10-CM | POA: Diagnosis not present

## 2019-05-09 DIAGNOSIS — R609 Edema, unspecified: Secondary | ICD-10-CM | POA: Diagnosis not present

## 2019-05-09 DIAGNOSIS — F1011 Alcohol abuse, in remission: Secondary | ICD-10-CM | POA: Diagnosis not present

## 2019-05-09 DIAGNOSIS — Z1509 Genetic susceptibility to other malignant neoplasm: Secondary | ICD-10-CM | POA: Diagnosis not present

## 2019-05-09 DIAGNOSIS — C50811 Malignant neoplasm of overlapping sites of right female breast: Secondary | ICD-10-CM | POA: Diagnosis not present

## 2019-05-09 DIAGNOSIS — Z171 Estrogen receptor negative status [ER-]: Secondary | ICD-10-CM | POA: Diagnosis not present

## 2019-05-09 DIAGNOSIS — K5900 Constipation, unspecified: Secondary | ICD-10-CM | POA: Diagnosis not present

## 2019-05-09 DIAGNOSIS — G893 Neoplasm related pain (acute) (chronic): Secondary | ICD-10-CM | POA: Diagnosis not present

## 2019-05-09 DIAGNOSIS — N3091 Cystitis, unspecified with hematuria: Secondary | ICD-10-CM | POA: Diagnosis not present

## 2019-05-09 DIAGNOSIS — C50911 Malignant neoplasm of unspecified site of right female breast: Secondary | ICD-10-CM | POA: Diagnosis not present

## 2019-05-16 DIAGNOSIS — Z171 Estrogen receptor negative status [ER-]: Secondary | ICD-10-CM | POA: Diagnosis not present

## 2019-05-16 DIAGNOSIS — Z853 Personal history of malignant neoplasm of breast: Secondary | ICD-10-CM | POA: Diagnosis not present

## 2019-05-16 DIAGNOSIS — C50811 Malignant neoplasm of overlapping sites of right female breast: Secondary | ICD-10-CM | POA: Diagnosis not present

## 2019-05-22 DIAGNOSIS — Z171 Estrogen receptor negative status [ER-]: Secondary | ICD-10-CM | POA: Diagnosis not present

## 2019-05-22 DIAGNOSIS — C50811 Malignant neoplasm of overlapping sites of right female breast: Secondary | ICD-10-CM | POA: Diagnosis not present

## 2019-05-28 ENCOUNTER — Telehealth: Payer: Self-pay

## 2019-05-28 NOTE — Telephone Encounter (Signed)
Pt called stating that she is having lower extremity edema x 2 weeks. No pain or any other symptoms. Taking furosemide 1-2 x daily and elevating twice a day. Please advise.//ah

## 2019-05-28 NOTE — Telephone Encounter (Signed)
Can you see that she follows MP

## 2019-05-29 NOTE — Telephone Encounter (Signed)
LMOM to return call and schedule appt.//ah

## 2019-05-29 NOTE — Telephone Encounter (Signed)
Bring in for office visit.  Thanks MJP

## 2019-05-29 NOTE — Telephone Encounter (Signed)
Please advise. You and JG have seen pt.//ah

## 2019-06-05 ENCOUNTER — Encounter: Payer: Self-pay | Admitting: Cardiology

## 2019-06-05 ENCOUNTER — Ambulatory Visit (INDEPENDENT_AMBULATORY_CARE_PROVIDER_SITE_OTHER): Payer: Medicare Other | Admitting: Cardiology

## 2019-06-05 ENCOUNTER — Other Ambulatory Visit: Payer: Self-pay

## 2019-06-05 VITALS — BP 97/40 | HR 43 | Ht 63.0 in | Wt 171.0 lb

## 2019-06-05 DIAGNOSIS — I5022 Chronic systolic (congestive) heart failure: Secondary | ICD-10-CM | POA: Diagnosis not present

## 2019-06-05 DIAGNOSIS — I428 Other cardiomyopathies: Secondary | ICD-10-CM

## 2019-06-05 DIAGNOSIS — Z95810 Presence of automatic (implantable) cardiac defibrillator: Secondary | ICD-10-CM | POA: Diagnosis not present

## 2019-06-05 NOTE — Progress Notes (Signed)
Follow up visit  Subjective:   Rachel Duncan, female    DOB: Jan 05, 1981, 38 y.o.   MRN: [id number]   Chief Complaint  Patient presents with  . Congestive Heart Failure  . Follow-up    HPI  38 year old African American female with nonischemic cardiomyopathy status post cardiac arrest in December 2017, ICD placement for secondary prevention  Patient has had worsening leg edema in the past few weeks.  Of note, patient was recently diagnosed with invasive ductal carcinoma, ER PR negative, in right breast.  She has been undergoing chemotherapy with docetaxel/cyclophosphamide (dose adjusted due to reduced EF). Given that patient is BRCA 1 positive, she is going to undergo bilateral mastectomy on 07/29.  Past Medical History:  Diagnosis Date  . Acute respiratory failure with hypoxia ([place])   . Anoxic encephalopathy ([place])   . Cardiac arrest ([place])   . Encephalopathy   . Seizures ([place])   . Ventricular fibrillation [facility])      Past Surgical History:  Procedure Laterality Date  . CARDIAC CATHETERIZATION N/A 10/21/2016   Procedure: Left Heart Cath and Coronary Angiography;  Surgeon: Adrian Prows, MD;  Location: [place] [facility];  Service: Cardiovascular;  Laterality: N/A;  . CESAREAN SECTION W/BTL  11/2017  . SUBQ ICD IMPLANT N/A 01/12/2017   Procedure: SubQ ICD Implant;  Surgeon: Evans Lance, MD;  Location: [place] [facility];  Service: Cardiovascular;  Laterality: N/A;     Social History   Socioeconomic History  . Marital status: Single    Spouse name: Not on file  . Number of children: Not on file  . Years of education: Not on file  . Highest education level: Not on file  Occupational History  . Not on file  Social Needs  . Financial resource strain: Not on file  . Food insecurity    Worry: Not on file    Inability: Not on file  . Transportation needs    Medical: Not on file    Non-medical: Not on file  Tobacco Use  . Smoking status: Former Smoker    Packs/day: 0.50     Types: Cigarettes    Quit date: 09/24/2016    Years since quitting: 2.6  . Smokeless tobacco: Never Used  Substance and Sexual Activity  . Alcohol use: Yes  . Drug use: No  . Sexual activity: Not Currently    Birth control/protection: None  Lifestyle  . Physical activity    Days per week: Not on file    Minutes per session: Not on file  . Stress: Not on file  Relationships  . Social Herbalist on phone: Not on file    Gets together: Not on file    Attends religious service: Not on file    Active member of club or organization: Not on file    Attends meetings of clubs or organizations: Not on file    Relationship status: Not on file  . Intimate partner violence    Fear of current or ex partner: Not on file    Emotionally abused: Not on file    Physically abused: Not on file    Forced sexual activity: Not on file  Other Topics Concern  . Not on file  Social History Narrative  . Not on file     Family History  Problem Relation Age of Onset  . Heart attack Father      Current Outpatient Medications on File Prior to Visit  Medication Sig  Dispense Refill  . docusate sodium (COLACE) 100 MG capsule Take 100 mg by mouth 2 (two) times daily.    Marland Kitchen ENTRESTO 97-103 MG TAKE 1 TABLET BY MOUTH TWICE DAILY 180 tablet 3  . ferrous sulfate 325 (65 FE) MG tablet Take 325 mg by mouth daily with breakfast.    . furosemide (LASIX) 40 MG tablet TAKE 1 TABLET BY MOUTH DAILY AS NEEDED 60 tablet 3  . HYDROcodone-acetaminophen (NORCO/VICODIN) 5-325 MG tablet Take 1 tablet by mouth every 6 (six) hours as needed for moderate pain.    Marland Kitchen ibuprofen (ADVIL,MOTRIN) 800 MG tablet Take 800 mg by mouth every 8 (eight) hours as needed.    . metoprolol (LOPRESSOR) 50 MG tablet Take 25 mg by mouth 2 (two) times daily.   1  . prenatal vitamin w/FE, FA (PRENATAL 1 + 1) 27-1 MG TABS tablet Take 1 tablet daily at 12 noon by mouth. 30 each 6   No current facility-administered medications on file  prior to visit.     Cardiovascular studies:  EKG 06/05/2019: Sinus rhythm 66 bpm. Anteroseptal T wave inversion. Consider ischemia.   Echocardiogram 09/13/2017: Left ventricle cavity is normal in size. Mild concentric hypertrophy of the left ventricle. Mild decrease in global wall motion. Frequent PVC's seen. LVEF 40-45% Mild mitral regurgitation. Mild tricuspid regurgitation. Estimated pulmonary artery systolic pressure is 33-82  mmHg. Mild pulmonic regurgitation. No significant change compared to prior echocardiogram on 12/26/2016.   Recent labs: 02/08/2018: H/H 11.5/34.8, MCV 90, plateelts 231 GLucose 95. BUN/Cr 8/0.91. Na 142, K 3.8   Review of Systems  Constitution: Negative for decreased appetite, malaise/fatigue, weight gain and weight loss.  HENT: Negative for congestion.   Eyes: Negative for visual disturbance.  Cardiovascular: Positive for leg swelling. Negative for chest pain, dyspnea on exertion, palpitations and syncope.  Respiratory: Negative for cough.   Endocrine: Negative for cold intolerance.  Hematologic/Lymphatic: Does not bruise/bleed easily.  Skin: Negative for itching and rash.  Musculoskeletal: Negative for myalgias.  Gastrointestinal: Negative for abdominal pain, nausea and vomiting.  Genitourinary: Negative for dysuria.  Neurological: Negative for dizziness and weakness.  Psychiatric/Behavioral: The patient is not nervous/anxious.   All other systems reviewed and are negative.        Vitals:   06/05/19 1126  BP: (!) 97/40  Pulse: (!) 43  SpO2: 100%    Body mass index is 30.29 kg/m. Filed Weights   06/05/19 1126  Weight: 171 lb (77.6 kg)    Objective:   Physical Exam  Constitutional: She is oriented to person, place, and time. She appears well-developed and well-nourished. No distress.  HENT:  Head: Normocephalic and atraumatic.  Eyes: Pupils are equal, round, and reactive to light. Conjunctivae are normal.  Neck: No JVD present.   Cardiovascular: Normal rate, regular rhythm and intact distal pulses.  Pulmonary/Chest: Effort normal and breath sounds normal. She has no wheezes. She has no rales.  Abdominal: Soft. Bowel sounds are normal. There is no rebound.  Musculoskeletal:        General: Edema (2+) present.  Lymphadenopathy:    She has no cervical adenopathy.  Neurological: She is alert and oriented to person, place, and time. No cranial nerve deficit.  Skin: Skin is warm and dry.  Psychiatric: She has a normal mood and affect.  Nursing note and vitals reviewed.       Assessment & Recommendations:   38 year old African American female with nonischemic cardiomyopathy status post cardiac arrest in December 2017, ICD placement for  secondary prevention  HFrEF: Nonischemic cardiomyopathy.  Worsening leg edema noted. Increase lasix to 80 mg bid for next few days. Check BMP on Monday. Continue Entresto 97-103 mg bid, metoprolol succinate 50 mg daily. No ICD shocks noted.   Nigel Mormon, MD [facility] Pager: [phone] Office: [phone] If no answer Cell [phone]

## 2019-06-09 DIAGNOSIS — I5022 Chronic systolic (congestive) heart failure: Secondary | ICD-10-CM | POA: Diagnosis not present

## 2019-06-10 DIAGNOSIS — Z1502 Genetic susceptibility to malignant neoplasm of ovary: Secondary | ICD-10-CM | POA: Diagnosis not present

## 2019-06-10 DIAGNOSIS — C50811 Malignant neoplasm of overlapping sites of right female breast: Secondary | ICD-10-CM | POA: Diagnosis not present

## 2019-06-10 DIAGNOSIS — Z1509 Genetic susceptibility to other malignant neoplasm: Secondary | ICD-10-CM | POA: Diagnosis not present

## 2019-06-10 DIAGNOSIS — Z1501 Genetic susceptibility to malignant neoplasm of breast: Secondary | ICD-10-CM | POA: Diagnosis not present

## 2019-06-10 DIAGNOSIS — Z171 Estrogen receptor negative status [ER-]: Secondary | ICD-10-CM | POA: Diagnosis not present

## 2019-06-10 DIAGNOSIS — R590 Localized enlarged lymph nodes: Secondary | ICD-10-CM | POA: Diagnosis not present

## 2019-06-10 LAB — BASIC METABOLIC PANEL
BUN/Creatinine Ratio: 11 (ref 9–23)
BUN: 11 mg/dL (ref 6–20)
CO2: 19 mmol/L — ABNORMAL LOW (ref 20–29)
Calcium: 9 mg/dL (ref 8.7–10.2)
Chloride: 103 mmol/L (ref 96–106)
Creatinine, Ser: 0.99 mg/dL (ref 0.57–1.00)
GFR calc Af Amer: 85 mL/min/{1.73_m2} (ref >59)
GFR calc non Af Amer: 74 mL/min/{1.73_m2} (ref >59)
Glucose: 93 mg/dL (ref 65–99)
Potassium: 4.4 mmol/L (ref 3.5–5.2)
Sodium: 141 mmol/L (ref 134–144)

## 2019-06-12 ENCOUNTER — Ambulatory Visit (INDEPENDENT_AMBULATORY_CARE_PROVIDER_SITE_OTHER): Payer: Medicare Other

## 2019-06-12 ENCOUNTER — Other Ambulatory Visit: Payer: Self-pay

## 2019-06-12 ENCOUNTER — Encounter: Payer: Self-pay | Admitting: Cardiology

## 2019-06-12 ENCOUNTER — Ambulatory Visit (INDEPENDENT_AMBULATORY_CARE_PROVIDER_SITE_OTHER): Payer: Medicare Other | Admitting: Cardiology

## 2019-06-12 VITALS — BP 107/61 | HR 65 | Ht 63.0 in | Wt 171.0 lb

## 2019-06-12 DIAGNOSIS — I5022 Chronic systolic (congestive) heart failure: Secondary | ICD-10-CM | POA: Diagnosis not present

## 2019-06-12 MED ORDER — POTASSIUM CHLORIDE ER 20 MEQ PO TBCR: 10.0000 meq | EXTENDED_RELEASE_TABLET | Freq: Every day | ORAL | 2 refills | Status: DC

## 2019-06-12 MED ORDER — FUROSEMIDE 40 MG PO TABS: 40.0000 mg | ORAL_TABLET | Freq: Two times a day (BID) | ORAL | 2 refills | Status: DC

## 2019-06-12 MED ORDER — POTASSIUM CHLORIDE CRYS ER 20 MEQ PO TBCR: 20.0000 meq | EXTENDED_RELEASE_TABLET | Freq: Every day | ORAL | 1 refills | Status: DC

## 2019-06-12 NOTE — Progress Notes (Signed)
Rachel Duncan, Rachel    DOB: Duncan, 38 y.o.   MRN: [id number]   Chief Complaint  Patient presents with  . Congestive Heart Failure  . Results    echo  . Rachel-up    Same day testing    38 year old [name] Rachel with nonischemic cardiomyopathy status post cardiac arrest in December 2017, Rachel Duncan, Rachel with BRCA 1 positive breast cancer.  Leg edema has marginally improved. She does not have over dyspnea. She is going to undergo breast cancer surgery next week.   Duncan EF has remained relatively steady post chemotherapy. She will likely need uptitration of Duncan heart failure therapy in future.    Past Medical History:  Diagnosis Date  . Acute respiratory failure with hypoxia ([place])   . Anoxic encephalopathy ([place])   . Cardiac arrest ([place])   . Encephalopathy   . Seizures ([place])   . Ventricular fibrillation [facility])      Past Surgical History:  Procedure Laterality Date  . CARDIAC CATHETERIZATION N/A 10/21/2016   Procedure: Left Heart Cath and Coronary Angiography;  Surgeon: Adrian Prows, MD;  Location: [place] [facility];  Service: Cardiovascular;  Laterality: N/A;  . CESAREAN SECTION W/BTL  11/2017  . SUBQ Rachel IMPLANT N/A 01/12/2017   Procedure: SubQ Rachel Implant;  Surgeon: Evans Lance, MD;  Location: [place] [facility];  Service: Cardiovascular;  Laterality: N/A;     Social History   Socioeconomic History  . Marital status: Single    Spouse name: Not on file  . Number of children: 5  . Years of education: Not on file  . Highest education level: Not on file  Occupational History  . Not on file  Social Needs  . Financial resource strain: Not on file  . Food insecurity    Worry: Not on file    Inability: Not on file  . Transportation needs    Medical: Not on file    Non-medical: Not on file  Tobacco Use  . Smoking status: Former Smoker    Packs/day: 0.50    Years: 15.00    Pack years: 7.50     Types: Cigarettes    Quit date: 09/24/2016    Years since quitting: 2.7  . Smokeless tobacco: Never Used  . Tobacco comment: started smoking at age 4  Substance and Sexual Activity  . Alcohol use: Not Currently  . Drug use: No  . Sexual activity: Not Currently    Birth control/protection: None  Lifestyle  . Physical activity    Days per week: Not on file    Minutes per session: Not on file  . Stress: Not on file  Relationships  . Social Herbalist on phone: Not on file    Gets together: Not on file    Attends religious service: Not on file    Active member of club or organization: Not on file    Attends meetings of clubs or organizations: Not on file    Relationship status: Not on file  . Intimate partner violence    Fear of current or ex partner: Not on file    Emotionally abused: Not on file    Physically abused: Not on file    Forced sexual activity: Not on file  Other Topics Concern  . Not on file  Social History Narrative  . Not on file     Family History  Problem Relation  Age of Onset  . Heart attack Father      Current Outpatient Medications on File Prior to Visit  Medication Sig Dispense Refill  . ENTRESTO 97-103 MG TAKE 1 TABLET BY MOUTH TWICE DAILY 180 tablet 3  . ferrous sulfate 325 (65 FE) MG tablet Take 325 mg by mouth as needed.     . furosemide (LASIX) 40 MG tablet TAKE 1 TABLET BY MOUTH DAILY AS NEEDED (Patient taking differently: 40 mg 2 (two) times daily. ) 60 tablet 3  . metoprolol succinate (TOPROL-XL) 50 MG 24 hr tablet Take 50 mg by mouth daily. Take with or immediately following a meal.     No current facility-administered medications on file prior to visit.     Cardiovascular studies:  Echocardiogram 06/12/2019: Mildly depressed LV systolic function with visual EF 45-50%. Left ventricle cavity is normal in size. Normal global wall motion. Normal diastolic filling pattern. Calculated EF 56%. Moderate (Grade II) mitral  regurgitation. Mild pulmonic regurgitation. Insignificant pericardial effusion. IVC is dilated with respiratory variation. Estimated RA pressure 8 mmHg. No significant change cAompared to previous study on 09/13/2017.  EKG 06/05/2019: Sinus rhythm 66 bpm. Anteroseptal T wave inversion. Consider ischemia.   Echocardiogram 09/13/2017: Left ventricle cavity is normal in size. Mild concentric hypertrophy of the left ventricle. Mild decrease in global wall motion. Frequent PVC's seen. LVEF 40-45% Mild mitral regurgitation. Mild tricuspid regurgitation. Estimated pulmonary artery systolic pressure is 02-58  mmHg. Mild pulmonic regurgitation. No significant change compared to prior echocardiogram on 12/26/2016.   Recent labs: 02/08/2018: H/H 11.5/34.8, MCV 90, plateelts 231 GLucose 95. BUN/Cr 8/0.91. Na 142, K 3.8   Review of Systems  Constitution: Negative for decreased appetite, malaise/fatigue, weight gain and weight loss.  HENT: Negative for congestion.   Eyes: Negative for visual disturbance.  Cardiovascular: Positive for leg swelling. Negative for chest pain, dyspnea on exertion, palpitations and syncope.  Respiratory: Negative for cough.   Endocrine: Negative for cold intolerance.  Hematologic/Lymphatic: Does not bruise/bleed easily.  Skin: Negative for itching and rash.  Musculoskeletal: Negative for myalgias.  Gastrointestinal: Negative for abdominal pain, nausea and vomiting.  Genitourinary: Negative for dysuria.  Neurological: Negative for dizziness and weakness.  Psychiatric/Behavioral: The patient is not nervous/anxious.   All other systems reviewed and are negative.        Vitals:   06/12/19 1446  BP: 107/61  Pulse: 65  SpO2: 99%    Body mass index is 30.29 kg/m. Filed Weights   06/12/19 1446  Weight: 171 lb (77.6 kg)    Objective:   Physical Exam  Constitutional: She is oriented to person, place, and time. She appears well-developed and  well-nourished. No distress.  HENT:  Head: Normocephalic and atraumatic.  Eyes: Pupils are equal, round, and reactive to light. Conjunctivae are normal.  Neck: No JVD present.  Cardiovascular: Normal rate, regular rhythm and intact distal pulses.  Pulmonary/Chest: Effort normal and breath sounds normal. She has no wheezes. She has no rales.  Abdominal: Soft. Bowel sounds are normal. There is no rebound.  Musculoskeletal:        General: Edema (2+) present.  Lymphadenopathy:    She has no cervical adenopathy.  Neurological: She is alert and oriented to person, place, and time. No cranial nerve deficit.  Skin: Skin is warm and dry.  Psychiatric: She has a normal mood and affect.  Nursing note and vitals reviewed.       Assessment & Recommendations:   38 year old [name] Rachel with nonischemic  cardiomyopathy status post cardiac arrest in December 2017, Rachel Duncan, Rachel Duncan EF has remained relatively steady post chemotherapy. She will likely need uptitration of Duncan heart failure therapy in future.  Continue Entresto 97-103 mg bid, metoprolol succinate 50 mg daily, lasix 40 mg bid.  No Rachel shocks noted.   Nigel Mormon, MD [facility] [place] Pager: [phone] Office: [phone] If no answer Cell [phone]

## 2019-06-13 ENCOUNTER — Encounter: Payer: Self-pay | Admitting: Cardiology

## 2019-06-16 DIAGNOSIS — C50811 Malignant neoplasm of overlapping sites of right female breast: Secondary | ICD-10-CM | POA: Diagnosis not present

## 2019-06-16 DIAGNOSIS — C50911 Malignant neoplasm of unspecified site of right female breast: Secondary | ICD-10-CM | POA: Diagnosis not present

## 2019-06-16 DIAGNOSIS — Z1159 Encounter for screening for other viral diseases: Secondary | ICD-10-CM | POA: Diagnosis not present

## 2019-06-18 DIAGNOSIS — Z1501 Genetic susceptibility to malignant neoplasm of breast: Secondary | ICD-10-CM | POA: Diagnosis not present

## 2019-06-18 DIAGNOSIS — I429 Cardiomyopathy, unspecified: Secondary | ICD-10-CM | POA: Diagnosis not present

## 2019-06-18 DIAGNOSIS — C50911 Malignant neoplasm of unspecified site of right female breast: Secondary | ICD-10-CM | POA: Diagnosis not present

## 2019-06-18 DIAGNOSIS — C50811 Malignant neoplasm of overlapping sites of right female breast: Secondary | ICD-10-CM | POA: Diagnosis not present

## 2019-06-18 DIAGNOSIS — Z9289 Personal history of other medical treatment: Secondary | ICD-10-CM | POA: Diagnosis not present

## 2019-06-18 DIAGNOSIS — Z421 Encounter for breast reconstruction following mastectomy: Secondary | ICD-10-CM | POA: Diagnosis not present

## 2019-06-18 DIAGNOSIS — C50511 Malignant neoplasm of lower-outer quadrant of right female breast: Secondary | ICD-10-CM | POA: Diagnosis not present

## 2019-06-18 DIAGNOSIS — Z9013 Acquired absence of bilateral breasts and nipples: Secondary | ICD-10-CM | POA: Diagnosis not present

## 2019-06-18 DIAGNOSIS — Z8674 Personal history of sudden cardiac arrest: Secondary | ICD-10-CM | POA: Diagnosis not present

## 2019-06-18 DIAGNOSIS — Z4001 Encounter for prophylactic removal of breast: Secondary | ICD-10-CM | POA: Diagnosis not present

## 2019-06-18 DIAGNOSIS — Z95810 Presence of automatic (implantable) cardiac defibrillator: Secondary | ICD-10-CM | POA: Diagnosis not present

## 2019-06-18 DIAGNOSIS — Z9221 Personal history of antineoplastic chemotherapy: Secondary | ICD-10-CM | POA: Diagnosis not present

## 2019-06-18 DIAGNOSIS — C773 Secondary and unspecified malignant neoplasm of axilla and upper limb lymph nodes: Secondary | ICD-10-CM | POA: Diagnosis not present

## 2019-06-18 DIAGNOSIS — Z4502 Encounter for adjustment and management of automatic implantable cardiac defibrillator: Secondary | ICD-10-CM | POA: Diagnosis not present

## 2019-06-18 DIAGNOSIS — Z171 Estrogen receptor negative status [ER-]: Secondary | ICD-10-CM | POA: Diagnosis not present

## 2019-06-18 DIAGNOSIS — Z87891 Personal history of nicotine dependence: Secondary | ICD-10-CM | POA: Diagnosis not present

## 2019-06-19 DIAGNOSIS — Z9289 Personal history of other medical treatment: Secondary | ICD-10-CM | POA: Diagnosis not present

## 2019-06-19 DIAGNOSIS — I429 Cardiomyopathy, unspecified: Secondary | ICD-10-CM | POA: Diagnosis not present

## 2019-06-19 DIAGNOSIS — C773 Secondary and unspecified malignant neoplasm of axilla and upper limb lymph nodes: Secondary | ICD-10-CM | POA: Diagnosis not present

## 2019-06-19 DIAGNOSIS — C50811 Malignant neoplasm of overlapping sites of right female breast: Secondary | ICD-10-CM | POA: Diagnosis not present

## 2019-06-19 DIAGNOSIS — Z9221 Personal history of antineoplastic chemotherapy: Secondary | ICD-10-CM | POA: Diagnosis not present

## 2019-06-19 DIAGNOSIS — Z171 Estrogen receptor negative status [ER-]: Secondary | ICD-10-CM | POA: Diagnosis not present

## 2019-06-20 DIAGNOSIS — C50811 Malignant neoplasm of overlapping sites of right female breast: Secondary | ICD-10-CM | POA: Diagnosis not present

## 2019-06-20 DIAGNOSIS — I429 Cardiomyopathy, unspecified: Secondary | ICD-10-CM | POA: Diagnosis not present

## 2019-06-20 DIAGNOSIS — Z171 Estrogen receptor negative status [ER-]: Secondary | ICD-10-CM | POA: Diagnosis not present

## 2019-06-20 DIAGNOSIS — Z9221 Personal history of antineoplastic chemotherapy: Secondary | ICD-10-CM | POA: Diagnosis not present

## 2019-06-20 DIAGNOSIS — Z9289 Personal history of other medical treatment: Secondary | ICD-10-CM | POA: Diagnosis not present

## 2019-06-20 DIAGNOSIS — C773 Secondary and unspecified malignant neoplasm of axilla and upper limb lymph nodes: Secondary | ICD-10-CM | POA: Diagnosis not present

## 2019-06-25 ENCOUNTER — Encounter: Payer: Self-pay | Admitting: Cardiology

## 2019-06-25 DIAGNOSIS — Z8674 Personal history of sudden cardiac arrest: Secondary | ICD-10-CM

## 2019-06-25 HISTORY — DX: Personal history of sudden cardiac arrest: Z86.74

## 2019-06-27 DIAGNOSIS — Z171 Estrogen receptor negative status [ER-]: Secondary | ICD-10-CM | POA: Diagnosis not present

## 2019-06-27 DIAGNOSIS — Z1509 Genetic susceptibility to other malignant neoplasm: Secondary | ICD-10-CM | POA: Diagnosis not present

## 2019-06-27 DIAGNOSIS — C50811 Malignant neoplasm of overlapping sites of right female breast: Secondary | ICD-10-CM | POA: Diagnosis not present

## 2019-06-27 DIAGNOSIS — Z09 Encounter for follow-up examination after completed treatment for conditions other than malignant neoplasm: Secondary | ICD-10-CM | POA: Diagnosis not present

## 2019-06-27 DIAGNOSIS — R1900 Intra-abdominal and pelvic swelling, mass and lump, unspecified site: Secondary | ICD-10-CM | POA: Diagnosis not present

## 2019-06-27 DIAGNOSIS — Z1501 Genetic susceptibility to malignant neoplasm of breast: Secondary | ICD-10-CM | POA: Diagnosis not present

## 2019-07-06 DIAGNOSIS — Z9013 Acquired absence of bilateral breasts and nipples: Secondary | ICD-10-CM | POA: Diagnosis not present

## 2019-07-06 DIAGNOSIS — C50911 Malignant neoplasm of unspecified site of right female breast: Secondary | ICD-10-CM | POA: Diagnosis not present

## 2019-07-06 DIAGNOSIS — R509 Fever, unspecified: Secondary | ICD-10-CM | POA: Diagnosis not present

## 2019-07-07 DIAGNOSIS — Z1509 Genetic susceptibility to other malignant neoplasm: Secondary | ICD-10-CM | POA: Diagnosis not present

## 2019-07-07 DIAGNOSIS — R1900 Intra-abdominal and pelvic swelling, mass and lump, unspecified site: Secondary | ICD-10-CM | POA: Diagnosis not present

## 2019-07-07 DIAGNOSIS — Z1501 Genetic susceptibility to malignant neoplasm of breast: Secondary | ICD-10-CM | POA: Diagnosis not present

## 2019-07-11 ENCOUNTER — Other Ambulatory Visit: Payer: Self-pay

## 2019-07-11 ENCOUNTER — Ambulatory Visit (INDEPENDENT_AMBULATORY_CARE_PROVIDER_SITE_OTHER): Payer: Medicare Other | Admitting: Cardiology

## 2019-07-11 ENCOUNTER — Encounter: Payer: Self-pay | Admitting: Cardiology

## 2019-07-11 VITALS — BP 110/51 | HR 69 | Ht 63.0 in | Wt 161.0 lb

## 2019-07-11 DIAGNOSIS — I5022 Chronic systolic (congestive) heart failure: Secondary | ICD-10-CM | POA: Diagnosis not present

## 2019-07-11 DIAGNOSIS — C50811 Malignant neoplasm of overlapping sites of right female breast: Secondary | ICD-10-CM | POA: Diagnosis not present

## 2019-07-11 DIAGNOSIS — Z171 Estrogen receptor negative status [ER-]: Secondary | ICD-10-CM | POA: Diagnosis not present

## 2019-07-11 DIAGNOSIS — Z95810 Presence of automatic (implantable) cardiac defibrillator: Secondary | ICD-10-CM | POA: Diagnosis not present

## 2019-07-11 MED ORDER — ENTRESTO 97-103 MG PO TABS: 1.0000 | ORAL_TABLET | Freq: Two times a day (BID) | ORAL | 3 refills | Status: DC

## 2019-07-11 NOTE — Progress Notes (Signed)
Follow up visit  Subjective:   Rachel Duncan, female    DOB: 1981/08/20, 38 y.o.   MRN: [id number]   Chief Complaint  Patient presents with  . Congestive Heart Failure  . Follow-up    38 year old [name] female with nonischemic cardiomyopathy status post cardiac arrest in December 2017, ICD placement for secondary prevention, now with BRCA 1 positive breast cancer.  Patient underwent bilateral mastectomy without any adverse cardiac events. Her leg edema and dyspnea symptoms have resolved.  Past Medical History:  Diagnosis Date  . Acute respiratory failure with hypoxia ([place])   . Anoxic encephalopathy ([place])   . Cardiac arrest ([place])   . Encephalopathy   . H/O cardiac arrest 06/25/2019  . Seizures ([place])   . Ventricular fibrillation [facility])      Past Surgical History:  Procedure Laterality Date  . CARDIAC CATHETERIZATION N/A 10/21/2016   Procedure: Left Heart Cath and Coronary Angiography;  Surgeon: Adrian Prows, MD;  Location: [place] [facility];  Service: Cardiovascular;  Laterality: N/A;  . CESAREAN SECTION W/BTL  11/2017  . SUBQ ICD IMPLANT N/A 01/12/2017   Procedure: SubQ ICD Implant;  Surgeon: Evans Lance, MD;  Location: [place] [facility];  Service: Cardiovascular;  Laterality: N/A;     Social History   Socioeconomic History  . Marital status: Single    Spouse name: Not on file  . Number of children: 5  . Years of education: Not on file  . Highest education level: Not on file  Occupational History  . Not on file  Social Needs  . Financial resource strain: Not on file  . Food insecurity    Worry: Not on file    Inability: Not on file  . Transportation needs    Medical: Not on file    Non-medical: Not on file  Tobacco Use  . Smoking status: Former Smoker    Packs/day: 0.50    Years: 15.00    Pack years: 7.50    Types: Cigarettes    Quit date: 09/24/2016    Years since quitting: 2.7  . Smokeless tobacco: Never Used  . Tobacco comment: started  smoking at age 46  Substance and Sexual Activity  . Alcohol use: Not Currently  . Drug use: No  . Sexual activity: Not Currently    Birth control/protection: None  Lifestyle  . Physical activity    Days per week: Not on file    Minutes per session: Not on file  . Stress: Not on file  Relationships  . Social Herbalist on phone: Not on file    Gets together: Not on file    Attends religious service: Not on file    Active member of club or organization: Not on file    Attends meetings of clubs or organizations: Not on file    Relationship status: Not on file  . Intimate partner violence    Fear of current or ex partner: Not on file    Emotionally abused: Not on file    Physically abused: Not on file    Forced sexual activity: Not on file  Other Topics Concern  . Not on file  Social History Narrative  . Not on file     Family History  Problem Relation Age of Onset  . Heart attack Father      Current Outpatient Medications on File Prior to Visit  Medication Sig Dispense Refill  . Acetaminophen 325 MG CAPS Take by mouth  as needed.    . ferrous sulfate 325 (65 FE) MG tablet Take 325 mg by mouth as needed.     . furosemide (LASIX) 40 MG tablet Take 1 tablet (40 mg total) by mouth 2 (two) times daily. 90 tablet 2  . metoprolol succinate (TOPROL-XL) 50 MG 24 hr tablet Take 50 mg by mouth daily. Take with or immediately following a meal.    . potassium chloride SA (K-DUR) 20 MEQ tablet Take 1 tablet (20 mEq total) by mouth daily. 90 tablet 1   No current facility-administered medications on file prior to visit.     Cardiovascular studies:  Echocardiogram 06/12/2019: Mildly depressed LV systolic function with visual EF 45-50%. Left ventricle cavity is normal in size. Normal global wall motion. Normal diastolic filling pattern. Calculated EF 56%. Moderate (Grade II) mitral regurgitation. Mild pulmonic regurgitation. Insignificant pericardial effusion. IVC is  dilated with respiratory variation. Estimated RA pressure 8 mmHg. No significant change cAompared to previous study on 09/13/2017.  EKG 06/05/2019: Sinus rhythm 66 bpm. Anteroseptal T wave inversion. Consider ischemia.   Echocardiogram 09/13/2017: Left ventricle cavity is normal in size. Mild concentric hypertrophy of the left ventricle. Mild decrease in global wall motion. Frequent PVC's seen. LVEF 40-45% Mild mitral regurgitation. Mild tricuspid regurgitation. Estimated pulmonary artery systolic pressure is 67-34  mmHg. Mild pulmonic regurgitation. No significant change compared to prior echocardiogram on 12/26/2016.   Recent labs: 02/08/2018: H/H 11.5/34.8, MCV 90, plateelts 231 GLucose 95. BUN/Cr 8/0.91. Na 142, K 3.8   Review of Systems  Constitution: Negative for decreased appetite, malaise/fatigue, weight gain and weight loss.  HENT: Negative for congestion.   Eyes: Negative for visual disturbance.  Cardiovascular: Negative for chest pain, dyspnea on exertion, leg swelling, palpitations and syncope.  Respiratory: Negative for cough.   Endocrine: Negative for cold intolerance.  Hematologic/Lymphatic: Does not bruise/bleed easily.  Skin: Negative for itching and rash.  Musculoskeletal: Negative for myalgias.  Gastrointestinal: Negative for abdominal pain, nausea and vomiting.  Genitourinary: Negative for dysuria.  Neurological: Negative for dizziness and weakness.  Psychiatric/Behavioral: The patient is not nervous/anxious.   All other systems reviewed and are negative.        Vitals:   07/11/19 1135  BP: (!) 110/51  Pulse: 69  SpO2: 100%    Body mass index is 28.52 kg/m. Filed Weights   07/11/19 1135  Weight: 161 lb (73 kg)    Objective:   Physical Exam  Constitutional: She is oriented to person, place, and time. She appears well-developed and well-nourished. No distress.  HENT:  Head: Normocephalic and atraumatic.  Eyes: Pupils are equal, round, and  reactive to light. Conjunctivae are normal.  Neck: No JVD present.  Cardiovascular: Normal rate, regular rhythm and intact distal pulses.  Extrasystoles are present.  Pulmonary/Chest: Effort normal and breath sounds normal. She has no wheezes. She has no rales.  Abdominal: Soft. Bowel sounds are normal. There is no rebound.  Musculoskeletal:        General: No edema (2+).  Lymphadenopathy:    She has no cervical adenopathy.  Neurological: She is alert and oriented to person, place, and time. No cranial nerve deficit.  Skin: Skin is warm and dry.  Psychiatric: She has a normal mood and affect.  Nursing note and vitals reviewed.       Assessment & Recommendations:   38 year old African American female with nonischemic cardiomyopathy status post cardiac arrest in December 2017, ICD placement for secondary prevention, now with BRCA 1 positive breast  cancer.  HFrEF: Nonischemic cardiomyopathy.  Her EF has remained relatively steady post chemotherapy.  Clinically euvolumic today. Continue Entresto 97-103 mg bid, metoprolol succinate 50 mg daily, lasix 40 mg bid.   F/u in 3 months.   Nigel Mormon, MD [facility]. [facility] Pager: [phone] Office: [phone] If no answer Cell [phone]

## 2019-07-15 DIAGNOSIS — Z08 Encounter for follow-up examination after completed treatment for malignant neoplasm: Secondary | ICD-10-CM | POA: Diagnosis not present

## 2019-07-15 DIAGNOSIS — Z9013 Acquired absence of bilateral breasts and nipples: Secondary | ICD-10-CM | POA: Diagnosis not present

## 2019-07-15 DIAGNOSIS — Z171 Estrogen receptor negative status [ER-]: Secondary | ICD-10-CM | POA: Diagnosis not present

## 2019-07-15 DIAGNOSIS — Z87891 Personal history of nicotine dependence: Secondary | ICD-10-CM | POA: Diagnosis not present

## 2019-07-15 DIAGNOSIS — F1011 Alcohol abuse, in remission: Secondary | ICD-10-CM | POA: Diagnosis not present

## 2019-07-15 DIAGNOSIS — C50811 Malignant neoplasm of overlapping sites of right female breast: Secondary | ICD-10-CM | POA: Diagnosis not present

## 2019-07-15 DIAGNOSIS — Z95810 Presence of automatic (implantable) cardiac defibrillator: Secondary | ICD-10-CM | POA: Diagnosis not present

## 2019-07-17 DIAGNOSIS — I4901 Ventricular fibrillation: Secondary | ICD-10-CM | POA: Diagnosis not present

## 2019-07-17 DIAGNOSIS — Z9221 Personal history of antineoplastic chemotherapy: Secondary | ICD-10-CM | POA: Diagnosis not present

## 2019-07-17 DIAGNOSIS — Z452 Encounter for adjustment and management of vascular access device: Secondary | ICD-10-CM | POA: Diagnosis not present

## 2019-07-17 DIAGNOSIS — Z171 Estrogen receptor negative status [ER-]: Secondary | ICD-10-CM | POA: Diagnosis not present

## 2019-07-17 DIAGNOSIS — C50811 Malignant neoplasm of overlapping sites of right female breast: Secondary | ICD-10-CM | POA: Diagnosis not present

## 2019-07-23 DIAGNOSIS — C50811 Malignant neoplasm of overlapping sites of right female breast: Secondary | ICD-10-CM | POA: Diagnosis not present

## 2019-07-23 DIAGNOSIS — C50911 Malignant neoplasm of unspecified site of right female breast: Secondary | ICD-10-CM | POA: Diagnosis not present

## 2019-08-01 DIAGNOSIS — C50811 Malignant neoplasm of overlapping sites of right female breast: Secondary | ICD-10-CM | POA: Diagnosis not present

## 2019-08-01 DIAGNOSIS — Z510 Encounter for antineoplastic radiation therapy: Secondary | ICD-10-CM | POA: Diagnosis not present

## 2019-08-05 DIAGNOSIS — C50811 Malignant neoplasm of overlapping sites of right female breast: Secondary | ICD-10-CM | POA: Diagnosis not present

## 2019-08-05 DIAGNOSIS — Z510 Encounter for antineoplastic radiation therapy: Secondary | ICD-10-CM | POA: Diagnosis not present

## 2019-08-07 DIAGNOSIS — C50811 Malignant neoplasm of overlapping sites of right female breast: Secondary | ICD-10-CM | POA: Diagnosis not present

## 2019-08-07 DIAGNOSIS — Z510 Encounter for antineoplastic radiation therapy: Secondary | ICD-10-CM | POA: Diagnosis not present

## 2019-08-12 DIAGNOSIS — C50811 Malignant neoplasm of overlapping sites of right female breast: Secondary | ICD-10-CM | POA: Diagnosis not present

## 2019-08-12 DIAGNOSIS — Z510 Encounter for antineoplastic radiation therapy: Secondary | ICD-10-CM | POA: Diagnosis not present

## 2019-08-13 DIAGNOSIS — Z510 Encounter for antineoplastic radiation therapy: Secondary | ICD-10-CM | POA: Diagnosis not present

## 2019-08-13 DIAGNOSIS — C50811 Malignant neoplasm of overlapping sites of right female breast: Secondary | ICD-10-CM | POA: Diagnosis not present

## 2019-08-14 DIAGNOSIS — Z510 Encounter for antineoplastic radiation therapy: Secondary | ICD-10-CM | POA: Diagnosis not present

## 2019-08-14 DIAGNOSIS — C50811 Malignant neoplasm of overlapping sites of right female breast: Secondary | ICD-10-CM | POA: Diagnosis not present

## 2019-08-15 DIAGNOSIS — Z510 Encounter for antineoplastic radiation therapy: Secondary | ICD-10-CM | POA: Diagnosis not present

## 2019-08-15 DIAGNOSIS — C50811 Malignant neoplasm of overlapping sites of right female breast: Secondary | ICD-10-CM | POA: Diagnosis not present

## 2019-08-18 DIAGNOSIS — Z510 Encounter for antineoplastic radiation therapy: Secondary | ICD-10-CM | POA: Diagnosis not present

## 2019-08-18 DIAGNOSIS — C50811 Malignant neoplasm of overlapping sites of right female breast: Secondary | ICD-10-CM | POA: Diagnosis not present

## 2019-08-19 DIAGNOSIS — C50811 Malignant neoplasm of overlapping sites of right female breast: Secondary | ICD-10-CM | POA: Diagnosis not present

## 2019-08-19 DIAGNOSIS — Z510 Encounter for antineoplastic radiation therapy: Secondary | ICD-10-CM | POA: Diagnosis not present

## 2019-08-20 DIAGNOSIS — C50811 Malignant neoplasm of overlapping sites of right female breast: Secondary | ICD-10-CM | POA: Diagnosis not present

## 2019-08-20 DIAGNOSIS — Z510 Encounter for antineoplastic radiation therapy: Secondary | ICD-10-CM | POA: Diagnosis not present

## 2019-08-21 DIAGNOSIS — Z510 Encounter for antineoplastic radiation therapy: Secondary | ICD-10-CM | POA: Diagnosis not present

## 2019-08-21 DIAGNOSIS — C50811 Malignant neoplasm of overlapping sites of right female breast: Secondary | ICD-10-CM | POA: Diagnosis not present

## 2019-08-22 DIAGNOSIS — Z510 Encounter for antineoplastic radiation therapy: Secondary | ICD-10-CM | POA: Diagnosis not present

## 2019-08-22 DIAGNOSIS — C50811 Malignant neoplasm of overlapping sites of right female breast: Secondary | ICD-10-CM | POA: Diagnosis not present

## 2019-08-25 DIAGNOSIS — C50811 Malignant neoplasm of overlapping sites of right female breast: Secondary | ICD-10-CM | POA: Diagnosis not present

## 2019-08-25 DIAGNOSIS — Z510 Encounter for antineoplastic radiation therapy: Secondary | ICD-10-CM | POA: Diagnosis not present

## 2019-08-26 DIAGNOSIS — C50811 Malignant neoplasm of overlapping sites of right female breast: Secondary | ICD-10-CM | POA: Diagnosis not present

## 2019-08-26 DIAGNOSIS — Z510 Encounter for antineoplastic radiation therapy: Secondary | ICD-10-CM | POA: Diagnosis not present

## 2019-08-27 DIAGNOSIS — Z510 Encounter for antineoplastic radiation therapy: Secondary | ICD-10-CM | POA: Diagnosis not present

## 2019-08-27 DIAGNOSIS — C50811 Malignant neoplasm of overlapping sites of right female breast: Secondary | ICD-10-CM | POA: Diagnosis not present

## 2019-08-28 ENCOUNTER — Encounter: Payer: Self-pay | Admitting: Cardiology

## 2019-08-28 DIAGNOSIS — Z4502 Encounter for adjustment and management of automatic implantable cardiac defibrillator: Secondary | ICD-10-CM

## 2019-08-28 DIAGNOSIS — C50811 Malignant neoplasm of overlapping sites of right female breast: Secondary | ICD-10-CM | POA: Diagnosis not present

## 2019-08-28 DIAGNOSIS — Z510 Encounter for antineoplastic radiation therapy: Secondary | ICD-10-CM | POA: Diagnosis not present

## 2019-08-28 DIAGNOSIS — Z13220 Encounter for screening for lipoid disorders: Secondary | ICD-10-CM | POA: Insufficient documentation

## 2019-08-28 HISTORY — DX: Encounter for adjustment and management of automatic implantable cardiac defibrillator: Z45.02

## 2019-08-29 DIAGNOSIS — C50811 Malignant neoplasm of overlapping sites of right female breast: Secondary | ICD-10-CM | POA: Diagnosis not present

## 2019-08-29 DIAGNOSIS — Z510 Encounter for antineoplastic radiation therapy: Secondary | ICD-10-CM | POA: Diagnosis not present

## 2019-09-01 DIAGNOSIS — Z510 Encounter for antineoplastic radiation therapy: Secondary | ICD-10-CM | POA: Diagnosis not present

## 2019-09-01 DIAGNOSIS — C50811 Malignant neoplasm of overlapping sites of right female breast: Secondary | ICD-10-CM | POA: Diagnosis not present

## 2019-09-02 DIAGNOSIS — Z510 Encounter for antineoplastic radiation therapy: Secondary | ICD-10-CM | POA: Diagnosis not present

## 2019-09-02 DIAGNOSIS — C50811 Malignant neoplasm of overlapping sites of right female breast: Secondary | ICD-10-CM | POA: Diagnosis not present

## 2019-09-03 DIAGNOSIS — C50811 Malignant neoplasm of overlapping sites of right female breast: Secondary | ICD-10-CM | POA: Diagnosis not present

## 2019-09-03 DIAGNOSIS — Z510 Encounter for antineoplastic radiation therapy: Secondary | ICD-10-CM | POA: Diagnosis not present

## 2019-09-04 DIAGNOSIS — C50811 Malignant neoplasm of overlapping sites of right female breast: Secondary | ICD-10-CM | POA: Diagnosis not present

## 2019-09-04 DIAGNOSIS — Z510 Encounter for antineoplastic radiation therapy: Secondary | ICD-10-CM | POA: Diagnosis not present

## 2019-09-05 DIAGNOSIS — C50811 Malignant neoplasm of overlapping sites of right female breast: Secondary | ICD-10-CM | POA: Diagnosis not present

## 2019-09-05 DIAGNOSIS — Z510 Encounter for antineoplastic radiation therapy: Secondary | ICD-10-CM | POA: Diagnosis not present

## 2019-09-08 DIAGNOSIS — C50811 Malignant neoplasm of overlapping sites of right female breast: Secondary | ICD-10-CM | POA: Diagnosis not present

## 2019-09-08 DIAGNOSIS — Z510 Encounter for antineoplastic radiation therapy: Secondary | ICD-10-CM | POA: Diagnosis not present

## 2019-09-09 DIAGNOSIS — C50811 Malignant neoplasm of overlapping sites of right female breast: Secondary | ICD-10-CM | POA: Diagnosis not present

## 2019-09-09 DIAGNOSIS — Z510 Encounter for antineoplastic radiation therapy: Secondary | ICD-10-CM | POA: Diagnosis not present

## 2019-09-10 ENCOUNTER — Telehealth: Payer: Self-pay

## 2019-09-10 DIAGNOSIS — C50811 Malignant neoplasm of overlapping sites of right female breast: Secondary | ICD-10-CM | POA: Diagnosis not present

## 2019-09-10 DIAGNOSIS — Z510 Encounter for antineoplastic radiation therapy: Secondary | ICD-10-CM | POA: Diagnosis not present

## 2019-09-10 NOTE — Telephone Encounter (Signed)
Telephone encounter:  Reason for call: Patient says that [facility] where she takes her radiation treatments says her hr is staying in the 40's when she is there for her treatments. The doctor has suggested she cut her metoprolol dosage to 25mg . Is this ok to do?  Usual provider:Dr. Virgina Jock  Last office visit: 07/11/2019  Next office visit: 10/09/19    Last hospitalization: 06/18/2019   Current Outpatient Medications on File Prior to Visit  Medication Sig Dispense Refill  . Acetaminophen 325 MG CAPS Take by mouth as needed.    . ferrous sulfate 325 (65 FE) MG tablet Take 325 mg by mouth as needed.     . furosemide (LASIX) 40 MG tablet Take 1 tablet (40 mg total) by mouth 2 (two) times daily. 90 tablet 2  . metoprolol succinate (TOPROL-XL) 50 MG 24 hr tablet Take 50 mg by mouth daily. Take with or immediately following a meal.    . potassium chloride SA (K-DUR) 20 MEQ tablet Take 1 tablet (20 mEq total) by mouth daily. 90 tablet 1  . sacubitril-valsartan (ENTRESTO) 97-103 MG Take 1 tablet by mouth 2 (two) times daily. 180 tablet 3   No current facility-administered medications on file prior to visit.

## 2019-09-10 NOTE — Telephone Encounter (Signed)
Gave recommendation for Metoprolol. Patient verbalized understanding.

## 2019-09-10 NOTE — Telephone Encounter (Signed)
She has h/o PVC's, which are likely the reason for her HR noted to be falsely lower. Unless she is symptomatic with new dizziness symptoms, I advice against makinng any change to her metoprolol dose, as it will help with reducing PVC burden.  Thanks MJP

## 2019-09-11 DIAGNOSIS — C50811 Malignant neoplasm of overlapping sites of right female breast: Secondary | ICD-10-CM | POA: Diagnosis not present

## 2019-09-11 DIAGNOSIS — Z510 Encounter for antineoplastic radiation therapy: Secondary | ICD-10-CM | POA: Diagnosis not present

## 2019-09-12 DIAGNOSIS — Z510 Encounter for antineoplastic radiation therapy: Secondary | ICD-10-CM | POA: Diagnosis not present

## 2019-09-12 DIAGNOSIS — C50811 Malignant neoplasm of overlapping sites of right female breast: Secondary | ICD-10-CM | POA: Diagnosis not present

## 2019-09-15 DIAGNOSIS — Z510 Encounter for antineoplastic radiation therapy: Secondary | ICD-10-CM | POA: Diagnosis not present

## 2019-09-15 DIAGNOSIS — C50811 Malignant neoplasm of overlapping sites of right female breast: Secondary | ICD-10-CM | POA: Diagnosis not present

## 2019-09-16 DIAGNOSIS — Z510 Encounter for antineoplastic radiation therapy: Secondary | ICD-10-CM | POA: Diagnosis not present

## 2019-09-16 DIAGNOSIS — C50811 Malignant neoplasm of overlapping sites of right female breast: Secondary | ICD-10-CM | POA: Diagnosis not present

## 2019-09-17 DIAGNOSIS — I472 Ventricular tachycardia: Secondary | ICD-10-CM | POA: Diagnosis not present

## 2019-09-17 DIAGNOSIS — C50811 Malignant neoplasm of overlapping sites of right female breast: Secondary | ICD-10-CM | POA: Diagnosis not present

## 2019-09-17 DIAGNOSIS — Z8674 Personal history of sudden cardiac arrest: Secondary | ICD-10-CM | POA: Diagnosis not present

## 2019-09-17 DIAGNOSIS — Z510 Encounter for antineoplastic radiation therapy: Secondary | ICD-10-CM | POA: Diagnosis not present

## 2019-09-17 DIAGNOSIS — Z95810 Presence of automatic (implantable) cardiac defibrillator: Secondary | ICD-10-CM | POA: Diagnosis not present

## 2019-09-17 DIAGNOSIS — Z4502 Encounter for adjustment and management of automatic implantable cardiac defibrillator: Secondary | ICD-10-CM | POA: Diagnosis not present

## 2019-09-18 DIAGNOSIS — Z510 Encounter for antineoplastic radiation therapy: Secondary | ICD-10-CM | POA: Diagnosis not present

## 2019-09-18 DIAGNOSIS — C50811 Malignant neoplasm of overlapping sites of right female breast: Secondary | ICD-10-CM | POA: Diagnosis not present

## 2019-10-06 ENCOUNTER — Other Ambulatory Visit: Payer: Self-pay

## 2019-10-06 DIAGNOSIS — I5022 Chronic systolic (congestive) heart failure: Secondary | ICD-10-CM

## 2019-10-06 MED ORDER — METOPROLOL SUCCINATE ER 50 MG PO TB24: 50.0000 mg | ORAL_TABLET | Freq: Every day | ORAL | 3 refills | Status: DC

## 2019-10-09 ENCOUNTER — Other Ambulatory Visit: Payer: Self-pay

## 2019-10-09 ENCOUNTER — Encounter: Payer: Self-pay | Admitting: Cardiology

## 2019-10-09 ENCOUNTER — Ambulatory Visit (INDEPENDENT_AMBULATORY_CARE_PROVIDER_SITE_OTHER): Payer: Medicare Other | Admitting: Cardiology

## 2019-10-09 VITALS — BP 118/42 | HR 50 | Temp 97.8°F | Ht 63.0 in | Wt 143.9 lb

## 2019-10-09 DIAGNOSIS — Z95810 Presence of automatic (implantable) cardiac defibrillator: Secondary | ICD-10-CM

## 2019-10-09 DIAGNOSIS — I5022 Chronic systolic (congestive) heart failure: Secondary | ICD-10-CM

## 2019-10-09 DIAGNOSIS — Z4502 Encounter for adjustment and management of automatic implantable cardiac defibrillator: Secondary | ICD-10-CM | POA: Diagnosis not present

## 2019-10-09 DIAGNOSIS — Z8674 Personal history of sudden cardiac arrest: Secondary | ICD-10-CM | POA: Diagnosis not present

## 2019-10-09 NOTE — Progress Notes (Signed)
Follow up visit  Subjective:   Rachel Duncan, female    DOB: [birth year], 38 y.o.   MRN: [id number]   Chief Complaint  Patient presents with  . chronic systolic heart failure  . Follow-up    1 year, labs, pacer ck    38 year old African American female with nonischemic cardiomyopathy status post cardiac arrest in December 2017, ICD placement for secondary prevention, now with BRCA 1 positive breast cancer.  Patient underwent bilateral mastectomy without any adverse cardiac events. Her leg edema and dyspnea symptoms have completely resolved.  Prior chemotherapy, she has lost close to 30 pounds, but is feeling better now.  She does not have any heart failure symptoms at this time.  No shocks seen on ICD interrogation.  Past Medical History:  Diagnosis Date  . Acute respiratory failure with hypoxia ([place])   . Anoxic encephalopathy ([place])   . Cardiac arrest ([place])   . Encephalopathy   . Encounter for assessment of implantable cardioverter-defibrillator (ICD) 08/28/2019  . H/O cardiac arrest 06/25/2019  . ICD [employer] Single chamber subcutaneous ICD 01/12/2017   Remote ICD check   10.5.20: There were 0 episodes detected in the "Shock" zone and 0 episodes detected in the "Conditional Shock" zone. 73 % projected remaining battery capacity. Battery longevity is not reported . RV pacing is <0.1 %.  . Seizures ([place])   . Ventricular fibrillation [facility])      Past Surgical History:  Procedure Laterality Date  . CARDIAC CATHETERIZATION N/A 10/21/2016   Procedure: Left Heart Cath and Coronary Angiography;  Surgeon: Adrian Prows, MD;  Location: [place] [facility];  Service: Cardiovascular;  Laterality: N/A;  . CESAREAN SECTION W/BTL  11/2017  . SUBQ ICD IMPLANT N/A 01/12/2017   Procedure: SubQ ICD Implant;  Surgeon: Evans Lance, MD;  Location: [place] [facility];  Service: Cardiovascular;  Laterality: N/A;     Social History   Socioeconomic History  . Marital status: Single    Spouse  name: Not on file  . Number of children: 5  . Years of education: Not on file  . Highest education level: Not on file  Occupational History  . Not on file  Social Needs  . Financial resource strain: Not on file  . Food insecurity    Worry: Not on file    Inability: Not on file  . Transportation needs    Medical: Not on file    Non-medical: Not on file  Tobacco Use  . Smoking status: Former Smoker    Packs/day: 0.50    Years: 15.00    Pack years: 7.50    Types: Cigarettes    Quit date: 09/24/2016    Years since quitting: 3.0  . Smokeless tobacco: Never Used  . Tobacco comment: started smoking at age 38  Substance and Sexual Activity  . Alcohol use: Not Currently  . Drug use: No  . Sexual activity: Not Currently    Birth control/protection: None  Lifestyle  . Physical activity    Days per week: Not on file    Minutes per session: Not on file  . Stress: Not on file  Relationships  . Social Herbalist on phone: Not on file    Gets together: Not on file    Attends religious service: Not on file    Active member of club or organization: Not on file    Attends meetings of clubs or organizations: Not on file    Relationship status:  Not on file  . Intimate partner violence    Fear of current or ex partner: Not on file    Emotionally abused: Not on file    Physically abused: Not on file    Forced sexual activity: Not on file  Other Topics Concern  . Not on file  Social History Narrative  . Not on file     Family History  Problem Relation Age of Onset  . Heart attack Father      Current Outpatient Medications on File Prior to Visit  Medication Sig Dispense Refill  . Acetaminophen 325 MG CAPS Take by mouth as needed.    . ferrous sulfate 325 (65 FE) MG tablet Take 325 mg by mouth as needed.     . furosemide (LASIX) 40 MG tablet Take 1 tablet (40 mg total) by mouth 2 (two) times daily. 90 tablet 2  . metoprolol succinate (TOPROL-XL) 50 MG 24 hr tablet Take  1 tablet (50 mg total) by mouth daily. Take with or immediately following a meal. 90 tablet 3  . potassium chloride SA (K-DUR) 20 MEQ tablet Take 1 tablet (20 mEq total) by mouth daily. 90 tablet 1  . sacubitril-valsartan (ENTRESTO) 97-103 MG Take 1 tablet by mouth 2 (two) times daily. 180 tablet 3   No current facility-administered medications on file prior to visit.     Cardiovascular studies:  Echocardiogram 06/12/2019: Mildly depressed LV systolic function with visual EF 45-50%. Left ventricle cavity is normal in size. Normal global wall motion. Normal diastolic filling pattern. Calculated EF 56%. Moderate (Grade II) mitral regurgitation. Mild pulmonic regurgitation. Insignificant pericardial effusion. IVC is dilated with respiratory variation. Estimated RA pressure 8 mmHg. No significant change cAompared to previous study on 09/13/2017.  EKG 06/05/2019: Sinus rhythm 66 bpm. Anteroseptal T wave inversion. Consider ischemia.   Echocardiogram 09/13/2017: Left ventricle cavity is normal in size. Mild concentric hypertrophy of the left ventricle. Mild decrease in global wall motion. Frequent PVC's seen. LVEF 40-45% Mild mitral regurgitation. Mild tricuspid regurgitation. Estimated pulmonary artery systolic pressure is 94-85  mmHg. Mild pulmonic regurgitation. No significant change compared to prior echocardiogram on 12/26/2016.   Recent labs: 02/08/2018: H/H 11.5/34.8, MCV 90, plateelts 231 GLucose 95. BUN/Cr 8/0.91. Na 142, K 3.8   Review of Systems  Constitution: Negative for decreased appetite, malaise/fatigue, weight gain and weight loss.  HENT: Negative for congestion.   Eyes: Negative for visual disturbance.  Cardiovascular: Negative for chest pain, dyspnea on exertion, leg swelling, palpitations and syncope.  Respiratory: Negative for cough.   Endocrine: Negative for cold intolerance.  Hematologic/Lymphatic: Does not bruise/bleed easily.  Skin: Negative for itching  and rash.  Musculoskeletal: Negative for myalgias.  Gastrointestinal: Negative for abdominal pain, nausea and vomiting.  Genitourinary: Negative for dysuria.  Neurological: Negative for dizziness and weakness.  Psychiatric/Behavioral: The patient is not nervous/anxious.   All other systems reviewed and are negative.        There were no vitals filed for this visit.  There is no height or weight on file to calculate BMI. There were no vitals filed for this visit.  Objective:   Physical Exam  Constitutional: She is oriented to person, place, and time. She appears well-developed and well-nourished. No distress.  HENT:  Head: Normocephalic and atraumatic.  Eyes: Pupils are equal, round, and reactive to light. Conjunctivae are normal.  Neck: No JVD present.  Cardiovascular: Normal rate, regular rhythm and intact distal pulses.  Extrasystoles are present.  No murmur heard. Pulmonary/Chest:  Effort normal and breath sounds normal. She has no wheezes. She has no rales.  Abdominal: Soft. Bowel sounds are normal. There is no rebound.  Musculoskeletal:        General: No edema (2+).  Lymphadenopathy:    She has no cervical adenopathy.  Neurological: She is alert and oriented to person, place, and time. No cranial nerve deficit.  Skin: Skin is warm and dry.  Psychiatric: She has a normal mood and affect.  Nursing note and vitals reviewed.       Assessment & Recommendations:   38 year old African American female with nonischemic cardiomyopathy status post cardiac arrest in December 2017, ICD placement for secondary prevention, now with BRCA 1 positive breast cancer.  HFrEF: Nonischemic cardiomyopathy.  Her EF has remained relatively steady post chemotherapy.  Clinically euvolumic today. Continue Entresto 97-103 mg bid, metoprolol succinate 50 mg daily,. Take Lasix only as necessary.    Encounter for care of ICD: [employer] subcutaneous ICD, implant date 01/12/2017  Admitting battery: 69% No events/shocks since last check.   F/u in 6 months  Raygan Skarda Esther Hardy, MD [facility] [place] Pager: [phone] Office: [phone] If no answer Cell [phone]

## 2019-10-13 DIAGNOSIS — Z171 Estrogen receptor negative status [ER-]: Secondary | ICD-10-CM | POA: Diagnosis not present

## 2019-10-13 DIAGNOSIS — C50919 Malignant neoplasm of unspecified site of unspecified female breast: Secondary | ICD-10-CM | POA: Diagnosis not present

## 2019-10-15 ENCOUNTER — Ambulatory Visit: Payer: Medicare Other | Admitting: Cardiology

## 2019-12-17 DIAGNOSIS — Z4502 Encounter for adjustment and management of automatic implantable cardiac defibrillator: Secondary | ICD-10-CM

## 2019-12-17 DIAGNOSIS — Z95810 Presence of automatic (implantable) cardiac defibrillator: Secondary | ICD-10-CM | POA: Diagnosis not present

## 2019-12-17 DIAGNOSIS — Z8674 Personal history of sudden cardiac arrest: Secondary | ICD-10-CM

## 2019-12-17 DIAGNOSIS — I472 Ventricular tachycardia: Secondary | ICD-10-CM

## 2020-02-27 ENCOUNTER — Other Ambulatory Visit: Payer: Self-pay | Admitting: Cardiology

## 2020-02-27 DIAGNOSIS — I5022 Chronic systolic (congestive) heart failure: Secondary | ICD-10-CM

## 2020-02-27 NOTE — Telephone Encounter (Signed)
took care of this

## 2020-02-27 NOTE — Telephone Encounter (Signed)
Patient called and said pharmacy told her we rejected her refill and she wants to know why. Please give her a call. Thanks

## 2020-03-01 NOTE — Telephone Encounter (Signed)
Please to close encounter

## 2020-04-06 ENCOUNTER — Encounter: Payer: Self-pay | Admitting: Cardiology

## 2020-04-07 NOTE — Progress Notes (Signed)
No show

## 2020-04-08 ENCOUNTER — Ambulatory Visit: Payer: Medicare Other | Admitting: Cardiology

## 2020-04-09 DIAGNOSIS — Z91199 Patient's noncompliance with other medical treatment and regimen due to unspecified reason: Secondary | ICD-10-CM | POA: Insufficient documentation

## 2020-05-05 ENCOUNTER — Ambulatory Visit: Payer: Medicare Other | Admitting: Cardiology

## 2020-05-05 NOTE — Progress Notes (Signed)
Rescheduled

## 2020-05-06 ENCOUNTER — Other Ambulatory Visit: Payer: Self-pay

## 2020-05-06 ENCOUNTER — Encounter: Payer: Self-pay | Admitting: Cardiology

## 2020-05-06 ENCOUNTER — Ambulatory Visit: Payer: Medicare Other | Admitting: Cardiology

## 2020-05-06 VITALS — BP 99/71 | HR 63 | Resp 17 | Ht 63.0 in | Wt 158.0 lb

## 2020-05-06 DIAGNOSIS — Z8674 Personal history of sudden cardiac arrest: Secondary | ICD-10-CM

## 2020-05-06 DIAGNOSIS — I5022 Chronic systolic (congestive) heart failure: Secondary | ICD-10-CM

## 2020-05-06 DIAGNOSIS — Z95810 Presence of automatic (implantable) cardiac defibrillator: Secondary | ICD-10-CM

## 2020-05-06 NOTE — Progress Notes (Signed)
   Follow up visit  Subjective:   Rachel Duncan, female    DOB: 23-Aug-1981, 39 y.o.   MRN: [id number]   Chief Complaint  Patient presents with  . systolic heart failure  . Follow-up    40 month    39 year old African American female with nonischemic cardiomyopathy status post cardiac arrest in December 2017, ICD placement for secondary prevention, now with BRCA 1 positive breast cancer.  She is doing well, denies chest pain, shortness of breath, palpitations, leg edema, orthopnea, PND, TIA/syncope. She is going to undergo right breast implant surgery soon.   Current Outpatient Medications on File Prior to Visit  Medication Sig Dispense Refill  . Acetaminophen 325 MG CAPS Take by mouth as needed.    Marland Kitchen ENTRESTO 97-103 MG TAKE 1 TABLET BY MOUTH TWICE DAILY 180 tablet 3  . metoprolol succinate (TOPROL-XL) 50 MG 24 hr tablet Take 1 tablet (50 mg total) by mouth daily. Take with or immediately following a meal. 90 tablet 3   No current facility-administered medications on file prior to visit.    Cardiovascular studies:  EKG 05/06/2020: Sinus rhythm 77 bpm Frequent pvcs  Scheduled Remote ICD check 02/25/2020:  This is a normal S-ICD follow-up. There were 0 episodes detected in the "Shock" zone and 0 episodes detected in the "Conditional Shock" zone. 65 % projected remaining battery capacity. Battery longevity is BOS  Echocardiogram 06/12/2019: Mildly depressed LV systolic function with visual EF 45-50%. Left ventricle cavity is normal in size. Normal global wall motion. Normal diastolic filling pattern. Calculated EF 56%. Moderate (Grade II) mitral regurgitation. Mild pulmonic regurgitation. Insignificant pericardial effusion. IVC is dilated with respiratory variation. Estimated RA pressure 8 mmHg. No significant change cAompared to previous study on 09/13/2017.  EKG 06/05/2019: Sinus rhythm 66 bpm. Anteroseptal T wave inversion. Consider ischemia.    Recent  labs: 02/08/2018: H/H 11.5/34.8, MCV 90, plateelts 231 GLucose 95. BUN/Cr 8/0.91. Na 142, K 3.8   Review of Systems  Cardiovascular: Negative for chest pain, dyspnea on exertion, leg swelling, palpitations and syncope.         Vitals:   05/06/20 1418  BP: 99/71  Pulse: 63  Resp: 17  SpO2: 97%     Body mass index is 27.99 kg/m. Filed Weights   05/06/20 1418  Weight: 158 lb (71.7 kg)    Objective:   Physical Exam Vitals and nursing note reviewed.  Constitutional:      General: She is not in acute distress. Neck:     Vascular: No JVD.  Cardiovascular:     Rate and Rhythm: Normal rate and regular rhythm.     Pulses: Intact distal pulses.     Heart sounds: Normal heart sounds. No murmur heard.   Pulmonary:     Effort: Pulmonary effort is normal.     Breath sounds: Normal breath sounds. No wheezing or rales.         Assessment & Recommendations:   39 year old African American female with nonischemic cardiomyopathy status post cardiac arrest in December 2017, ICD placement for secondary prevention, now with BRCA 1 positive breast cancer.  HFrEF: Nonischemic cardiomyopathy.  Her EF has remained relatively steady post chemotherapy.  Clinically euvolumic today. Continue Entresto 97-103 mg bid, metoprolol succinate 50 mg daily,. Recommend hydration Low risk for non-cardiac surgery  Echo in 6 months, f/u in 12 months  Johnn Krasowski Esther Hardy, MD [facility] Pager: [phone] Office: [phone] If no answer Cell [phone]

## 2020-07-09 ENCOUNTER — Telehealth: Payer: Self-pay

## 2020-07-09 NOTE — Telephone Encounter (Signed)
Recommend office visit to discuss further. Not sure what medication she is referring to.

## 2020-07-09 NOTE — Telephone Encounter (Signed)
Received a call from patient in regards to medication/care questions. Patient stated that her dentist recommended a "better, new" medication for her heart to replace Entresto and patient would like to change to this medication if possible. Patient is unaware of what the medication is called. Patient denies any averse effects to the [facility]. Patient also stated that you were responsible for clearing a home health professional come to her home to assist her exercise.However, patient has not been informed of the status of this. Please advise. Thanks!

## 2020-07-12 NOTE — Telephone Encounter (Signed)
Fu appt has been set for  9/3 at 10:00am   Thank you  -Leda Quail

## 2020-07-20 ENCOUNTER — Encounter: Payer: Self-pay | Admitting: Cardiology

## 2020-07-20 ENCOUNTER — Ambulatory Visit: Payer: Medicare Other | Admitting: Cardiology

## 2020-07-20 VITALS — BP 111/58 | HR 55 | Resp 16 | Ht 63.0 in | Wt 190.0 lb

## 2020-07-20 DIAGNOSIS — Z8674 Personal history of sudden cardiac arrest: Secondary | ICD-10-CM

## 2020-07-20 DIAGNOSIS — Z95810 Presence of automatic (implantable) cardiac defibrillator: Secondary | ICD-10-CM

## 2020-07-20 DIAGNOSIS — I5022 Chronic systolic (congestive) heart failure: Secondary | ICD-10-CM

## 2020-07-20 NOTE — Progress Notes (Signed)
Follow up visit  Subjective:   Breaunna Gottlieb, female    DOB: [birth year], 39 y.o.   MRN: [id number]   Chief Complaint  Patient presents with  . Congestive Heart Failure  . Follow-up    39 year old [name] female with nonischemic cardiomyopathy status post cardiac arrest in December 2017, ICD placement for secondary prevention, now with BRCA 1 positive breast cancer.  She is doing well, denies chest pain, shortness of breath, palpitations, leg edema, orthopnea, PND, TIA/syncope. She is going to undergo right breast implant surgery soon.  Recently, she has had weight gain.  However, denies any exertional dyspnea, leg edema symptoms.  Patient was reportedly told by her dentist that she should be put on a "different cough medication that will also help with weight loss."   Current Outpatient Medications on File Prior to Visit  Medication Sig Dispense Refill  . Acetaminophen 325 MG CAPS Take by mouth as needed.    Marland Kitchen ENTRESTO 97-103 MG TAKE 1 TABLET BY MOUTH TWICE DAILY 180 tablet 3  . furosemide (LASIX) 40 MG tablet Take 40 mg by mouth 2 (two) times daily.    . metoprolol succinate (TOPROL-XL) 50 MG 24 hr tablet Take 1 tablet (50 mg total) by mouth daily. Take with or immediately following a meal. 90 tablet 3   No current facility-administered medications on file prior to visit.    Cardiovascular studies:  EKG 05/06/2020: Sinus rhythm 77 bpm Frequent pvcs  Scheduled Remote ICD check 02/25/2020:  This is a normal S-ICD follow-up. There were 0 episodes detected in the "Shock" zone and 0 episodes detected in the "Conditional Shock" zone. 65 % projected remaining battery capacity. Battery longevity is BOS  Echocardiogram 06/12/2019: Mildly depressed LV systolic function with visual EF 45-50%. Left ventricle cavity is normal in size. Normal global wall motion. Normal diastolic filling pattern. Calculated EF 56%. Moderate (Grade II) mitral regurgitation. Mild pulmonic  regurgitation. Insignificant pericardial effusion. IVC is dilated with respiratory variation. Estimated RA pressure 8 mmHg. No significant change cAompared to previous study on 09/13/2017.  EKG 06/05/2019: Sinus rhythm 66 bpm. Anteroseptal T wave inversion. Consider ischemia.    Recent labs: 02/08/2018: H/H 11.5/34.8, MCV 90, plateelts 231 GLucose 95. BUN/Cr 8/0.91. Na 142, K 3.8   Review of Systems  Cardiovascular: Negative for chest pain, dyspnea on exertion, leg swelling, palpitations and syncope.         Vitals:   07/20/20 1012  BP: (!) 111/58  Pulse: (!) 55  Resp: 16  SpO2: 98%     Body mass index is 33.66 kg/m. Filed Weights   07/20/20 1012  Weight: 190 lb (86.2 kg)    Objective:   Physical Exam Vitals and nursing note reviewed.  Constitutional:      General: She is not in acute distress. Neck:     Vascular: No JVD.  Cardiovascular:     Rate and Rhythm: Normal rate and regular rhythm.     Pulses: Intact distal pulses.     Heart sounds: Normal heart sounds. No murmur heard.   Pulmonary:     Effort: Pulmonary effort is normal.     Breath sounds: Normal breath sounds. No wheezing or rales.         Assessment & Recommendations:   39 year old African American female with nonischemic cardiomyopathy status post cardiac arrest in December 2017, ICD placement for secondary prevention, now with BRCA 1 positive breast cancer.  HFrEF: Nonischemic cardiomyopathy.  Her EF has remained relatively steady post chemotherapy.  Clinically euvolumic today. Continue Entresto 97-103 mg bid, metoprolol succinate 50 mg daily,. I believe the patient is on guideline directed heart failure medical therapy with optimization of her cardiomyopathy.  I do not think she will tolerate any further up titration or addition of medications due to her low normal blood pressure.  I am not aware of any better heart failure medication that will help her achieve weight loss.   Encouraged diet and lifestyle modifications, including calorie control and regular walking.  No change was made to her medical therapy today.  F/u in 6 months  Mirko Tailor Esther Hardy, MD [facility] Pager: [phone] Office: [phone] If no answer Cell [phone]

## 2020-07-22 ENCOUNTER — Other Ambulatory Visit: Payer: Self-pay | Admitting: Cardiology

## 2020-07-22 DIAGNOSIS — I5022 Chronic systolic (congestive) heart failure: Secondary | ICD-10-CM

## 2020-07-23 ENCOUNTER — Ambulatory Visit: Payer: Medicare Other | Admitting: Cardiology

## 2020-10-04 ENCOUNTER — Telehealth: Payer: Self-pay | Admitting: Oncology

## 2020-10-04 NOTE — Telephone Encounter (Signed)
Gave patient Appt for 11/16 Labs 10:00 am - Consult with Dr Hinton Rao at 10:30 am

## 2020-10-04 NOTE — Progress Notes (Signed)
[facility]  [address] [place],  [place]  [address] [phone]  Clinic Day:  10/05/2020  Referring physician: No ref. provider found   CHIEF COMPLAINT:  CC: History of stage IIIB triple negative invasive ductal carcinoma  Current Treatment:  Surveillance   HISTORY OF PRESENT ILLNESS:  Rachel Duncan is a 39 y.o. female who presents to the clinic for a transfer of care with a history of stage IIIB (T2N1M0), triple negative invasive ductal carcinoma, diagnosed in February 2020.   She is a carrier for BRCA1.  This was found when the patient noticed a mass of the right breast, which was believed to be a cyst.  Mammography revealed a roughly 2.5 cm lesion with associated calcifications, as well as two additional smaller masses.  Biopsy was consistent with invasive ductal carcinoma, grade 3, triple negative.  Axillary lymphadenopathy was also seen.  Original CT imaging revealed a lucent lesion of the sternum, but subsequent bone scan was negative.  She received neoadjuvant chemotherapy with 6 cycles of TC in [facility], then subsequently underwent bilateral mastectomies and right sentinel lymph node biopsy with tissue expander reconstruction.   Pathology from her mastectomy revealed no residual carcinoma involving the right breast.  Lymph nodes were negative.  She received adjuvant radiation therapy here at the [facility] with Dr. Orlene Erm and completed a 50.4 Gy course in October 2020.  She started menarche at age 56 and has had no further menstrual periods since her chemotherapyl.  She had her first child at the age of 39.  She has previously used oral contraceptives, and has never been on hormone replacement therapy.    INTERVAL HISTORY:  Lorana states that she has been doing fairly well.  She will have intermittent pinching pain of the right breast implant at the lower outer quadrant, and when this happens the pain is severe at a 9/10.  I feel that the pain  may be due to her temporary expanders.  Her last menstrual period was during her chemotherapy treatment.  She is planning for hysterectomy and bilateral salpingo oophorectomy with GYN-Oncology at [facility], possibly at the beginning of the year. She was hoping to have her permanent implant placement coordinated at the same time.  She does have congestive heart failure and has had an episode of cardiac arrest.  She has a defibrillator in place and follows with a cardiologist regularly.  She does not have a primary care physician currently.  Her  appetite is good, and she is eating well.  She reports some weight gain.  She denies fever, chills or other signs of infection.  She denies nausea, vomiting, bowel issues, or abdominal pain.  She denies sore throat, cough, dyspnea, or chest pain.    REVIEW OF SYSTEMS:  Review of Systems  Musculoskeletal:       Pinching pain of the right breast, which is intermittent, but severe  All other systems reviewed and are negative.    VITALS:  Blood pressure (!) 115/55, pulse 67, temperature 98.6 F (37 C), temperature source Oral, resp. rate 18, height 5' 2.5" (1.588 m), weight 185 lb 1.6 oz (84 kg), SpO2 100 %, unknown if currently breastfeeding.  Wt Readings from Last 3 Encounters:  10/05/20 185 lb 1.6 oz (84 kg)  07/20/20 190 lb (86.2 kg)  05/06/20 158 lb (71.7 kg)    Body mass index is 33.32 kg/m.  Performance status (ECOG): 1 - Symptomatic but completely ambulatory  PHYSICAL EXAM:  Physical  Exam Constitutional:      General: She is not in acute distress.    Appearance: Normal appearance. She is normal weight.  HENT:     Head: Normocephalic and atraumatic.  Eyes:     General: No scleral icterus.    Extraocular Movements: Extraocular movements intact.     Conjunctiva/sclera: Conjunctivae normal.     Pupils: Pupils are equal, round, and reactive to light.  Cardiovascular:     Rate and Rhythm: Regular rhythm. Bradycardia present.     Pulses: Normal  pulses.     Heart sounds: Normal heart sounds. No murmur heard.  No friction rub. No gallop.   Pulmonary:     Effort: Pulmonary effort is normal. No respiratory distress.     Breath sounds: Normal breath sounds.  Chest:     Comments: Bilateral reconstructions are negative.  She has tenderness at the lower outer quadrant of the right breast implant which is not suspicious for recurrence. Abdominal:     General: Bowel sounds are normal. There is no distension.     Palpations: Abdomen is soft. There is no mass.     Tenderness: There is no abdominal tenderness.  Musculoskeletal:        General: Normal range of motion.     Cervical back: Normal range of motion and neck supple.     Right lower leg: No edema.     Left lower leg: No edema.  Lymphadenopathy:     Cervical: No cervical adenopathy.  Skin:    General: Skin is warm and dry.  Neurological:     General: No focal deficit present.     Mental Status: She is alert and oriented to person, place, and time. Mental status is at baseline.  Psychiatric:        Mood and Affect: Mood normal.        Behavior: Behavior normal.        Thought Content: Thought content normal.        Judgment: Judgment normal.    LABS:   CBC Latest Ref Rng & Units 11/05/2017 09/24/2017 01/02/2017  WBC 3.4 - 10.8 x10E3/uL 7.0 5.8 5.1  Hemoglobin 11.1 - 15.9 g/dL 11.2 10.3(L) 12.3  Hematocrit 34.0 - 46.6 % 34.9 30.7(L) 37.8  Platelets 150 - 379 x10E3/uL 253 227 293   CMP Latest Ref Rng & Units 06/09/2019 01/02/2017 11/01/2016  Glucose 65 - 99 mg/dL 93 81 100(H)  BUN 6 - 20 mg/dL _0 Creatinine 0.57 - 1.00 mg/dL 0.99 0.84 0.77  Sodium 134 - 144 mmol/L 141 140 137  Potassium 3.5 - 5.2 mmol/L 4.4 3.9 3.8  Chloride 96 - 106 mmol/L 103 100 104  CO2 20 - 29 mmol/L 19(L) 19 24  Calcium 8.7 - 10.2 mg/dL 9.0 9.4 9.1  Total Protein 6.5 - 8.1 g/dL - - -  Total Bilirubin 0.3 - 1.2 mg/dL - - -  Alkaline Phos 38 - 126 U/L - - -  AST 15 - 41 U/L - - -  ALT 14 -  54 U/L - - -     No results found for: MDY709 but this is pending today due to presence of her ovaries   Lab Results  Component Value Date   TIBC 368 12/11/2016   FERRITIN 63 09/24/2017   FERRITIN 78 12/11/2016   IRONPCTSAT 19 12/11/2016   No results found for: LDH    STUDIES:   She underwent a needle core biopsy on 12/20/2018 showing: 1.  Breast, right, 6:00, core needle biopsy:               -  Invasive ductal carcinoma               -  Nottingham combined histologic grade: 3               -  Tubule formation score: 3               -  Nuclear pleomorphism score: 3               -  Mitotic count score: 3               -  Ductal carcinoma in situ is not identified               -  Microcalcifications are not identified               -  Adenocarcinoma measures 15 mm in this biopsy specimen 2.  Axilla, right, core needle biopsy:               -  Lymph node involved by metastatic carcinoma, morphologically similar to the carcinoma in parts 2 and 3. 3.  Breast, right, 8:00, core needle biopsy:               -  Invasive ductal carcinoma               -  Nottingham combined histologic grade: 3               -  Tubule formation score: 3               -  Nuclear pleomorphism score: 3               -  Mitotic count score: 3               -  Ductal carcinoma in situ is not identified               -  Microcalcifications are not identified               -  Adenocarcinoma measures 5 mm in this biopsy specimen HER2: Negative ER: Negative, less than 1% PR: Negative, less than 1%   She underwent CT abdomen and pelvis with contrast on 12/31/2018 showing: 1.  Focal thickening of the anterosuperior bladder wall.  Consider direct visualization with cystoscopy for further evaluation.  2.  Otherwise no evidence of metastatic disease to the abdomen or pelvis.  She underwent a CT chest with contrast on 12/31/2018 showing: 1.  No discrete pulmonary nodule. 2.  Irregular 2.5 x 1.9 cm soft  tissue in the right breast consistent with known malignancy.  3.  Right axillary lymphadenopathy. 4.  Indeterminate 0.6 cm lucent sterna lesion.  Recommend correlation with concurrently performed same day bone scan.   She underwent a radionuclide bone scan on 12/31/2018 showing a normal exam.   She underwent bilateral mastectomies and right axillary sentinel lymph node biopsy x 5 on 06/18/2019 and surgical pathology from this procedure revealed: 1.  Lymph node, right sentinel, #1, lymphadenectomy:  -  One lymph node with no evidence of malignancy (0/1)  -  Changes consistent with prior biopsy site  -  Changes suggestive of treatment effect 2.  Breast, right, mastectomy:  -  Benign breast tissue with two microclips identified  -  No residual carcinoma identified  3.  Breast, left, mastectomy:  -  Lobular atrophy  -  Duct ectasia  -  No atypia, in situ or invasive carcinoma identified 4.  Soft tissue, right, non-sentinel excision:  -  One lymph node with no evidence of malignancy (0/1) 5.  Lymph node, right sentinel, #2, lymphadenectomy:  -  One lymph node with no evidence of malignancy (0/1) 6.  Lymph node, right sentinel, #3, lymphadenectomy:  -  One lymph node with no evidence of malignancy (0/1) 7.  Lymph node, right sentinel, #4, lymphadenectomy:  -  One lymph node with no evidence of malignancy (0/1) 8.  Lymph node, right sentinel, #5, lymphadenectomy:  -  One lymph node with no evidence of malignancy (0/1)    HISTORY:   Past Medical History:  Diagnosis Date  . Acute respiratory failure with hypoxia ([place])   . Anoxic encephalopathy ([place])   . Bradycardia   . BRCA1 genetic carrier   . Breast cancer ([place]) 12/2018   stage IIIB  . Cardiac arrest ([place])   . Encephalopathy   . Encounter for assessment of implantable cardioverter-defibrillator (ICD) 08/28/2019  . H/O cardiac arrest 06/25/2019  . ICD [employer] Single chamber subcutaneous ICD 01/12/2017   Remote ICD check    10.5.20: There were 0 episodes detected in the "Shock" zone and 0 episodes detected in the "Conditional Shock" zone. 73 % projected remaining battery capacity. Battery longevity is not reported . RV pacing is <0.1 %.  . Seizures ([place])   . Ventricular fibrillation [facility])     Past Surgical History:  Procedure Laterality Date  . BREAST LUMPECTOMY Left 2020  . BREAST RECONSTRUCTION Bilateral   . CARDIAC CATHETERIZATION N/A 10/21/2016   Procedure: Left Heart Cath and Coronary Angiography;  Surgeon: Adrian Prows, MD;  Location: [place] [facility];  Service: Cardiovascular;  Laterality: N/A;  . CESAREAN SECTION W/BTL  11/2017  . MASTECTOMY W/ SENTINEL NODE BIOPSY Bilateral   . SUBQ ICD IMPLANT N/A 01/12/2017   Procedure: SubQ ICD Implant;  Surgeon: Evans Lance, MD;  Location: [place] [facility];  Service: Cardiovascular;  Laterality: N/A;  . TUBAL LIGATION      Family History  Problem Relation Age of Onset  . Heart attack Father   . Breast cancer Maternal Aunt     Social History:  reports that she quit smoking about 4 years ago. Her smoking use included cigarettes. She has a 7.50 pack-year smoking history. She has never used smokeless tobacco. She reports previous alcohol use. She reports that she does not use drugs.The patient is alone today.  She is single and lives at home.  She has 5 children.    Allergies: No Known Allergies  Current Medications: Current Outpatient Medications  Medication Sig Dispense Refill  . ENTRESTO 97-103 MG TAKE 1 TABLET BY MOUTH TWICE DAILY 180 tablet 3  . furosemide (LASIX) 40 MG tablet TAKE 1 TABLET(40 MG) BY MOUTH TWICE DAILY 180 tablet 1  . metoprolol succinate (TOPROL-XL) 50 MG 24 hr tablet Take 1 tablet (50 mg total) by mouth daily. Take with or immediately following a meal. 90 tablet 3   No current facility-administered medications for this visit.     ASSESSMENT & PLAN:   Assessment:   1.  Stage IIIB (T2N1M0), triple negative invasive ductal  carcinoma, diagnosed in February 2020.   She received neoadjuvant chemotherapy with 6 cycles of TC in [facility], then subsequently underwent bilateral mastectomies.  She received adjuvant radiation therapy here at  the [facility] with Dr. Orlene Erm and completed a 50.4 Gy course in October 2020.  I find no evidence of disease.    2.  Carrier for BRCA1.  She is planning on undergoing hysterectomy and bilateral salpingo oophorectomy with GYN-Oncology at [facility], possibly at the beginning of the year.  I will add a CA 125 to her labs today.  If she does not have the surgery, she will need pelvic ultrasounds every 6 months.    3.  Intermittent, severe, pinching pain of the right reconstruction.  I feel this may be due to her temporary expanders so will hold off on any additional imaging.  She is hoping to coordinate her breast implant surgery with her hysterectomy, hopefully at the beginning of the year.    Plan:   We discussed her diagnosis today, and as she has completed treatment, we will continue with routine surveillance.  She is a carrier for BRCA1, and is planning on undergoing hysterectomy and bilateral salpingo oophorectomy at [facility], hopefully within the next year.  She still has her temporary expanders in place, which I feel is the likely culprit for her intermittent right breast pain.  She is trying to coordinate her breast surgery with her other procedures.  I will add a CA 125 to her labs today, and repeat this every 6 months until she undergoes surgery with GYN-oncology at [facility].  We will see her back in 3 months with CBC, CMP for repeat examination.  She understands and agrees to this plan of care.  I have answered her questions and she knows to call with any concerns.    Thank you for the opportunity    I provided 50 minutes of face-to-face time during this this encounter and > 50% was spent counseling as documented under my assessment and plan.    Derwood Kaplan, MD [facility] [address] [place] [address] Dept: [phone] Dept Fax: [phone]   I, Rita Ohara, am acting as scribe for Derwood Kaplan, MD  I have reviewed this report as typed by the medical scribe, and it is complete and accurate.

## 2020-10-05 ENCOUNTER — Other Ambulatory Visit: Payer: Self-pay | Admitting: Oncology

## 2020-10-05 ENCOUNTER — Other Ambulatory Visit: Payer: Self-pay

## 2020-10-05 ENCOUNTER — Inpatient Hospital Stay: Payer: Medicare Other

## 2020-10-05 ENCOUNTER — Inpatient Hospital Stay: Payer: Medicare Other | Attending: Oncology | Admitting: Oncology

## 2020-10-05 ENCOUNTER — Encounter: Payer: Self-pay | Admitting: Oncology

## 2020-10-05 ENCOUNTER — Telehealth: Payer: Self-pay | Admitting: Oncology

## 2020-10-05 VITALS — BP 115/55 | HR 67 | Temp 98.6°F | Resp 18 | Ht 62.5 in | Wt 185.1 lb

## 2020-10-05 DIAGNOSIS — Z171 Estrogen receptor negative status [ER-]: Secondary | ICD-10-CM

## 2020-10-05 DIAGNOSIS — Z79899 Other long term (current) drug therapy: Secondary | ICD-10-CM | POA: Insufficient documentation

## 2020-10-05 DIAGNOSIS — C50511 Malignant neoplasm of lower-outer quadrant of right female breast: Secondary | ICD-10-CM

## 2020-10-05 DIAGNOSIS — Z1273 Encounter for screening for malignant neoplasm of ovary: Secondary | ICD-10-CM | POA: Diagnosis not present

## 2020-10-05 DIAGNOSIS — Z1509 Genetic susceptibility to other malignant neoplasm: Secondary | ICD-10-CM

## 2020-10-05 DIAGNOSIS — R001 Bradycardia, unspecified: Secondary | ICD-10-CM

## 2020-10-05 DIAGNOSIS — C773 Secondary and unspecified malignant neoplasm of axilla and upper limb lymph nodes: Secondary | ICD-10-CM

## 2020-10-05 DIAGNOSIS — Z803 Family history of malignant neoplasm of breast: Secondary | ICD-10-CM | POA: Diagnosis not present

## 2020-10-05 DIAGNOSIS — Z1502 Genetic susceptibility to malignant neoplasm of ovary: Secondary | ICD-10-CM

## 2020-10-05 DIAGNOSIS — N644 Mastodynia: Secondary | ICD-10-CM

## 2020-10-05 DIAGNOSIS — Z148 Genetic carrier of other disease: Secondary | ICD-10-CM | POA: Diagnosis not present

## 2020-10-05 DIAGNOSIS — Z1501 Genetic susceptibility to malignant neoplasm of breast: Secondary | ICD-10-CM

## 2020-10-05 DIAGNOSIS — C50911 Malignant neoplasm of unspecified site of right female breast: Secondary | ICD-10-CM

## 2020-10-05 LAB — CBC WITH DIFFERENTIAL/PLATELET
Abs Immature Granulocytes: 0.02 10*3/uL (ref 0.00–0.07)
Basophils Absolute: 0 10*3/uL (ref 0.0–0.1)
Basophils Relative: 0 %
Eosinophils Absolute: 0.1 10*3/uL (ref 0.0–0.5)
Eosinophils Relative: 1 %
HCT: 40 % (ref 36.0–46.0)
Hemoglobin: 12.7 g/dL (ref 12.0–15.0)
Immature Granulocytes: 0 %
Lymphocytes Relative: 38 %
Lymphs Abs: 1.7 10*3/uL (ref 0.7–4.0)
MCH: 30 pg (ref 26.0–34.0)
MCHC: 31.8 g/dL (ref 30.0–36.0)
MCV: 94.6 fL (ref 80.0–100.0)
Monocytes Absolute: 0.5 10*3/uL (ref 0.1–1.0)
Monocytes Relative: 10 %
Neutro Abs: 2.3 10*3/uL (ref 1.7–7.7)
Neutrophils Relative %: 51 %
Platelets: 211 10*3/uL (ref 150–400)
RBC: 4.23 MIL/uL (ref 3.87–5.11)
RDW: 13.1 % (ref 11.5–15.5)
WBC: 4.5 10*3/uL (ref 4.0–10.5)
nRBC: 0 % (ref 0.0–0.2)

## 2020-10-05 LAB — CMP (CANCER CENTER ONLY)
ALT: 13 U/L (ref 0–44)
AST: 16 U/L (ref 15–41)
Albumin: 4.7 g/dL (ref 3.5–5.0)
Alkaline Phosphatase: 73 U/L (ref 38–126)
Anion gap: 9 (ref 5–15)
BUN: 17 mg/dL (ref 6–20)
CO2: 25 mmol/L (ref 22–32)
Calcium: 9.2 mg/dL (ref 8.9–10.3)
Chloride: 103 mmol/L (ref 98–111)
Creatinine: 1.01 mg/dL — ABNORMAL HIGH (ref 0.44–1.00)
GFR, Estimated: >60 mL/min (ref >60)
Glucose, Bld: 93 mg/dL (ref 70–99)
Potassium: 3.8 mmol/L (ref 3.5–5.1)
Sodium: 137 mmol/L (ref 135–145)
Total Bilirubin: 0.3 mg/dL (ref 0.3–1.2)
Total Protein: 8.6 g/dL — ABNORMAL HIGH (ref 6.5–8.1)

## 2020-10-05 NOTE — Telephone Encounter (Signed)
Per 11/16 los next appt given to patient

## 2020-10-07 ENCOUNTER — Other Ambulatory Visit: Payer: Self-pay | Admitting: Hematology and Oncology

## 2020-10-07 ENCOUNTER — Encounter: Payer: Self-pay | Admitting: Hematology and Oncology

## 2020-10-07 ENCOUNTER — Other Ambulatory Visit: Payer: Self-pay | Admitting: Oncology

## 2020-10-07 DIAGNOSIS — C50511 Malignant neoplasm of lower-outer quadrant of right female breast: Secondary | ICD-10-CM | POA: Diagnosis not present

## 2020-10-07 DIAGNOSIS — Z1502 Genetic susceptibility to malignant neoplasm of ovary: Secondary | ICD-10-CM | POA: Insufficient documentation

## 2020-10-08 LAB — CA 125: Cancer Antigen (CA) 125: 14.3 U/mL (ref 0.0–38.1)

## 2020-10-12 ENCOUNTER — Telehealth: Payer: Self-pay

## 2020-10-12 NOTE — Telephone Encounter (Signed)
-----   Message from Derwood Kaplan, MD sent at 10/12/2020 11:47 AM EST ----- Regarding: call pt Tell her all the labs look great

## 2020-10-12 NOTE — Telephone Encounter (Signed)
Patient notified

## 2020-11-05 ENCOUNTER — Other Ambulatory Visit: Payer: Medicare Other

## 2020-11-16 ENCOUNTER — Other Ambulatory Visit: Payer: Medicare Other

## 2020-11-29 ENCOUNTER — Other Ambulatory Visit: Payer: Self-pay | Admitting: Cardiology

## 2020-11-29 DIAGNOSIS — I5022 Chronic systolic (congestive) heart failure: Secondary | ICD-10-CM

## 2020-12-31 ENCOUNTER — Ambulatory Visit: Payer: Medicare Other

## 2020-12-31 ENCOUNTER — Other Ambulatory Visit: Payer: Self-pay

## 2020-12-31 DIAGNOSIS — Z95810 Presence of automatic (implantable) cardiac defibrillator: Secondary | ICD-10-CM

## 2020-12-31 DIAGNOSIS — I5022 Chronic systolic (congestive) heart failure: Secondary | ICD-10-CM

## 2021-01-03 NOTE — Progress Notes (Incomplete)
[facility]  [address] [place],    [address] [phone]  Clinic Day:  01/03/2021  Referring physician: No ref. provider found   CHIEF COMPLAINT:  CC: History of stage IIIB triple negative invasive ductal carcinoma  Current Treatment:  Surveillance   HISTORY OF PRESENT ILLNESS:  Rachel Duncan is a 40 y.o. female who presents to the clinic for a transfer of care with a history of stage IIIB (T2N1M0), triple negative invasive ductal carcinoma, diagnosed in February 2020.   She is a carrier for BRCA1.  This was found when the patient noticed a mass of the right breast, which was believed to be a cyst.  Mammography revealed a roughly 2.5 cm lesion with associated calcifications, as well as two additional smaller masses.  Biopsy was consistent with invasive ductal carcinoma, grade 3, triple negative.  Axillary lymphadenopathy was also seen.  Original CT imaging revealed a lucent lesion of the sternum, but subsequent bone scan was negative.  She received neoadjuvant chemotherapy with 6 cycles of TC in [facility], then subsequently underwent bilateral mastectomies and right sentinel lymph node biopsy with tissue expander reconstruction.   Pathology from her mastectomy revealed no residual carcinoma involving the right breast.  Lymph nodes were negative.  She received adjuvant radiation therapy here at the [facility] with Dr. Orlene Erm and completed a 50.4 Gy course in October 2020.  She started menarche at age 8 and has had no further menstrual periods since her chemotherapyl.  She had her first child at the age of 68.  She has previously used oral contraceptives, and has never been on hormone replacement therapy.    INTERVAL HISTORY:  Rachel Duncan states that she has been doing fairly well.  She will have intermittent pinching pain of the right breast implant at the lower outer quadrant, and when this happens the pain is severe at a 9/10.  I feel that the pain  may be due to her temporary expanders.  Her last menstrual period was during her chemotherapy treatment.  She is planning for hysterectomy and bilateral salpingo oophorectomy with GYN-Oncology at [facility], possibly at the beginning of the year. She was hoping to have her permanent implant placement coordinated at the same time.  She does have congestive heart failure and has had an episode of cardiac arrest.  She has a defibrillator in place and follows with a cardiologist regularly.  She does not have a primary care physician currently.  Her  appetite is good, and she is eating well.  She reports some weight gain.  She denies fever, chills or other signs of infection.  She denies nausea, vomiting, bowel issues, or abdominal pain.  She denies sore throat, cough, dyspnea, or chest pain.    Rachel Duncan is here for routine follow up ***.   Her  appetite is good, and she has gained/lost _ pounds since her last visit.  She denies fever, chills or other signs of infection.  She denies nausea, vomiting, bowel issues, or abdominal pain.  She denies sore throat, cough, dyspnea, or chest pain.  REVIEW OF SYSTEMS:  Review of Systems - Oncology   VITALS:  unknown if currently breastfeeding.  Wt Readings from Last 3 Encounters:  10/05/20 185 lb 1.6 oz (84 kg)  07/20/20 190 lb (86.2 kg)  05/06/20 158 lb (71.7 kg)    There is no height or weight on file to calculate BMI.  Performance status (ECOG): 1 - Symptomatic but completely ambulatory  PHYSICAL  EXAM:  Physical Exam LABS:   CBC Latest Ref Rng & Units 10/05/2020 11/05/2017 09/24/2017  WBC 4.0 - 10.5 K/uL 4.5 7.0 5.8  Hemoglobin 12.0 - 15.0 g/dL 12.7 11.2 10.3(L)  Hematocrit 36.0 - 46.0 % 40.0 34.9 30.7(L)  Platelets 150 - 400 K/uL 211 253 227   CMP Latest Ref Rng & Units 10/05/2020 06/09/2019 01/02/2017  Glucose 70 - 99 mg/dL 93 93 81  BUN 6 - 20 mg/dL $Remove'17 11 11  'dSQTjVn$ Creatinine 0.44 - 1.00 mg/dL 1.01(H) 0.99 0.84  Sodium 135 - 145 mmol/L 137 141 140  Potassium  3.5 - 5.1 mmol/L 3.8 4.4 3.9  Chloride 98 - 111 mmol/L 103 103 100  CO2 22 - 32 mmol/L 25 19(L) 19  Calcium 8.9 - 10.3 mg/dL 9.2 9.0 9.4  Total Protein 6.5 - 8.1 g/dL 8.6(H) - -  Total Bilirubin 0.3 - 1.2 mg/dL 0.3 - -  Alkaline Phos 38 - 126 U/L 73 - -  AST 15 - 41 U/L 16 - -  ALT 0 - 44 U/L 13 - -     Lab Results  Component Value Date   CAN125 14.3 10/07/2020   but this is pending today due to presence of her ovaries   Lab Results  Component Value Date   TIBC 368 12/11/2016   FERRITIN 63 09/24/2017   FERRITIN 78 12/11/2016   IRONPCTSAT 19 12/11/2016   No results found for: LDH    STUDIES:   No current studies  HISTORY:   Allergies: No Known Allergies  Current Medications: Current Outpatient Medications  Medication Sig Dispense Refill  . ENTRESTO 97-103 MG TAKE 1 TABLET BY MOUTH TWICE DAILY 180 tablet 3  . furosemide (LASIX) 40 MG tablet TAKE 1 TABLET(40 MG) BY MOUTH TWICE DAILY 180 tablet 1  . metoprolol succinate (TOPROL-XL) 50 MG 24 hr tablet TAKE 1 TABLET BY MOUTH EVERY DAY, TAKE WITH OR IMMEDIATELY FOLLOWING A MEAL 90 tablet 0   No current facility-administered medications for this visit.     ASSESSMENT & PLAN:   Assessment:   1.  Stage IIIB (T2N1M0), triple negative invasive ductal carcinoma, diagnosed in February 2020.   She received neoadjuvant chemotherapy with 6 cycles of TC in [facility], then subsequently underwent bilateral mastectomies.  She received adjuvant radiation therapy here at the [facility] with Dr. Orlene Erm and completed a 50.4 Gy course in October 2020.  I find no evidence of disease.    2.  Carrier for BRCA1.  She is planning on undergoing hysterectomy and bilateral salpingo oophorectomy with GYN-Oncology at [facility], possibly at the beginning of the year.  I will add a CA 125 to her labs today.  If she does not have the surgery, she will need pelvic ultrasounds every 6 months.    3.  Intermittent, severe, pinching pain of the right  reconstruction.  I feel this may be due to her temporary expanders so will hold off on any additional imaging.  She is hoping to coordinate her breast implant surgery with her hysterectomy, hopefully at the beginning of the year.    Plan:   We discussed her diagnosis today, and as she has completed treatment, we will continue with routine surveillance.  She is a carrier for BRCA1, and is planning on undergoing hysterectomy and bilateral salpingo oophorectomy at [facility], hopefully within the next year.  She still has her temporary expanders in place, which I feel is the likely culprit for her intermittent right breast pain.  She  is trying to coordinate her breast surgery with her other procedures.  I will add a CA 125 to her labs today, and repeat this every 6 months until she undergoes surgery with GYN-oncology at [facility].  We will see her back in 3 months with CBC, CMP for repeat examination.  She understands and agrees to this plan of care.  I have answered her questions and she knows to call with any concerns.     I provided *** minutes of face-to-face time during this this encounter and > 50% was spent counseling as documented under my assessment and plan.    Derwood Kaplan, MD [facility] AT [facility] [address] [place] [address] Dept: [phone] Dept Fax: [phone]   I, Rita Ohara, am acting as scribe for Derwood Kaplan, MD  I have reviewed this report as typed by the medical scribe, and it is complete and accurate.

## 2021-01-05 ENCOUNTER — Other Ambulatory Visit: Payer: Self-pay | Admitting: Hematology and Oncology

## 2021-01-05 ENCOUNTER — Inpatient Hospital Stay: Payer: Medicare Other | Attending: Oncology | Admitting: Oncology

## 2021-01-05 ENCOUNTER — Inpatient Hospital Stay: Payer: Medicare Other

## 2021-01-05 DIAGNOSIS — C50511 Malignant neoplasm of lower-outer quadrant of right female breast: Secondary | ICD-10-CM

## 2021-01-06 ENCOUNTER — Inpatient Hospital Stay: Payer: Medicare Other | Admitting: Oncology

## 2021-01-06 ENCOUNTER — Ambulatory Visit: Payer: Medicare Other

## 2021-01-06 NOTE — Progress Notes (Incomplete)
[facility]  [address] [place],  [place]  [address] [phone]  Clinic Day:  01/06/2021  Referring physician: No ref. provider found  This document serves as a record of services personally performed by Christine McCarty, MD. It was created on their behalf by Curry,Lauren E, a trained medical scribe. The creation of this record is based on the scribe's personal observations and the provider's statements to them.  CHIEF COMPLAINT:  CC: History of stage IIIB triple negative invasive ductal carcinoma  Current Treatment:  Surveillance   HISTORY OF PRESENT ILLNESS:  Rachel Duncan is a 39 y.o. female who presents to the clinic for a transfer of care with a history of stage IIIB (T2N1M0), triple negative invasive ductal carcinoma, diagnosed in February 2020.   She is a carrier for BRCA1.  This was found when the patient noticed a mass of the right breast, which was believed to be a cyst.  Mammography revealed a roughly 2.5 cm lesion with associated calcifications, as well as two additional smaller masses.  Biopsy was consistent with invasive ductal carcinoma, grade 3, triple negative.  Axillary lymphadenopathy was also seen.  Original CT imaging revealed a lucent lesion of the sternum, but subsequent bone scan was negative.  She received neoadjuvant chemotherapy with 6 cycles of TC in [place], then subsequently underwent bilateral mastectomies and right sentinel lymph node biopsy with tissue expander reconstruction.   Pathology from her mastectomy revealed no residual carcinoma involving the right breast.  Lymph nodes were negative.  She [facility] with Dr. Palermo and completed a 50.4 Gy course in October 2020.  She started menarche at age 15 and has had no further menstrual periods since her chemotherapyl.  She had her first child at the age of 20.  She has previously used oral contraceptives, and has never  been on hormone replacement therapy.    INTERVAL HISTORY:  Rachel Duncan states that she has been doing fairly well.  She will have intermittent pinching pain of the right breast implant at the lower outer quadrant, and when this happens the pain is severe at a 9/10.  I feel that the pain may be due to her temporary expanders.  Her last menstrual period was during her chemotherapy treatment.  She is planning for hysterectomy and bilateral salpingo oophorectomy with GYN-Oncology at [facility], possibly at the beginning of the year. She was hoping to have her permanent implant placement coordinated at the same time.  She does have congestive heart failure and has had an episode of cardiac arrest.  She has a defibrillator in place and follows with a cardiologist regularly.  She does not have a primary care physician currently.  Her  appetite is good, and she is eating well.  She reports some weight gain.  She denies fever, chills or other signs of infection.  She denies nausea, vomiting, bowel issues, or abdominal pain.  She denies sore throat, cough, dyspnea, or chest pain.    Rachel Duncan is here for routine follow up ***.   Her  appetite is good, and she has gained/lost _ pounds since her last visit.  She denies fever, chills or other signs of infection.  She denies nausea, vomiting, bowel issues, or abdominal pain.  She denies sore throat, cough, dyspnea, or chest pain.  REVIEW OF SYSTEMS:  Review of Systems - Oncology   VITALS:  unknown if currently breastfeeding.  Wt Readings from Last 3 Encounters:    10/05/20 185 lb 1.6 oz (84 kg)  07/20/20 190 lb (86.2 kg)  05/06/20 158 lb (71.7 kg)    There is no height or weight on file to calculate BMI.  Performance status (ECOG): 1 - Symptomatic but completely ambulatory  PHYSICAL EXAM:  Physical Exam LABS:   CBC Latest Ref Rng & Units 10/05/2020 11/05/2017 09/24/2017  WBC 4.0 - 10.5 K/uL 4.5 7.0 5.8  Hemoglobin 12.0 - 15.0 g/dL 12.7 11.2 10.3(L)  Hematocrit 36.0 -  46.0 % 40.0 34.9 30.7(L)  Platelets 150 - 400 K/uL 211 253 227   CMP Latest Ref Rng & Units 10/05/2020 06/09/2019 01/02/2017  Glucose 70 - 99 mg/dL 93 93 81  BUN 6 - 20 mg/dL 17 11 11  Creatinine 0.44 - 1.00 mg/dL 1.01(H) 0.99 0.84  Sodium 135 - 145 mmol/L 137 141 140  Potassium 3.5 - 5.1 mmol/L 3.8 4.4 3.9  Chloride 98 - 111 mmol/L 103 103 100  CO2 22 - 32 mmol/L 25 19(L) 19  Calcium 8.9 - 10.3 mg/dL 9.2 9.0 9.4  Total Protein 6.5 - 8.1 g/dL 8.6(H) - -  Total Bilirubin 0.3 - 1.2 mg/dL 0.3 - -  Alkaline Phos 38 - 126 U/L 73 - -  AST 15 - 41 U/L 16 - -  ALT 0 - 44 U/L 13 - -     Lab Results  Component Value Date   CAN125 14.3 10/07/2020     Lab Results  Component Value Date   TIBC 368 12/11/2016   FERRITIN 63 09/24/2017   FERRITIN 78 12/11/2016   IRONPCTSAT 19 12/11/2016   No results found for: LDH    STUDIES:   No current studies  HISTORY:   Allergies: No Known Allergies  Current Medications: Current Outpatient Medications  Medication Sig Dispense Refill  . ENTRESTO 97-103 MG TAKE 1 TABLET BY MOUTH TWICE DAILY 180 tablet 3  . furosemide (LASIX) 40 MG tablet TAKE 1 TABLET(40 MG) BY MOUTH TWICE DAILY 180 tablet 1  . metoprolol succinate (TOPROL-XL) 50 MG 24 hr tablet TAKE 1 TABLET BY MOUTH EVERY DAY, TAKE WITH OR IMMEDIATELY FOLLOWING A MEAL 90 tablet 0   No current facility-administered medications for this visit.     ASSESSMENT & PLAN:   Assessment:   1.  Stage IIIB (T2N1M0), triple negative invasive ductal carcinoma, diagnosed in February 2020.   She received neoadjuvant chemotherapy with 6 cycles of TC in [place], then subsequently underwent bilateral mastectomies.  She [facility] with [facility]. Palermo and completed a 50.4 Gy course in October 2020.  I find no evidence of disease.    2.  Carrier for BRCA1.  She is planning on undergoing hysterectomy and bilateral salpingo oophorectomy with GYN-Oncology at  [facility], possibly at the beginning of the year.  I will add a CA 125 to her labs today.  If she does not have the surgery, she will need pelvic ultrasounds every 6 months.    3.  Intermittent, severe, pinching pain of the right reconstruction.  I feel this may be due to her temporary expanders so will hold off on any additional imaging.  She is hoping to coordinate her breast implant surgery with her hysterectomy, hopefully at the beginning of the year.    Plan:   I will check a CA 125 every 6 months until she undergoes surgery with GYN-oncology at [facility].  We will see her back in 3 months with CBC, CMP and CEA   for repeat examination.  She understands and agrees to this plan of care.  I have answered her questions and she knows to call with any concerns.     I provided *** minutes of face-to-face time during this this encounter and > 50% was spent counseling as documented under my assessment and plan.    Derwood Kaplan, MD [facility] AT [facility] [address] [place] [address] Dept: [phone] Dept Fax: [phone]   I, Rita Ohara, am acting as scribe for Derwood Kaplan, MD  I have reviewed this report as typed by the medical scribe, and it is complete and accurate.

## 2021-01-13 ENCOUNTER — Telehealth: Payer: Self-pay

## 2021-01-13 NOTE — Telephone Encounter (Signed)
Telephone encounter:  Reason for call: pt called for results, only test I see is she had an echo back on 12/31/20 , she was never given results.  Usual provider: MP  Last office visit: 07/20/20  Next office visit: 01/21/21  Last hospitalization: na   Current Outpatient Medications on File Prior to Visit  Medication Sig Dispense Refill  . ENTRESTO 97-103 MG TAKE 1 TABLET BY MOUTH TWICE DAILY 180 tablet 3  . furosemide (LASIX) 40 MG tablet TAKE 1 TABLET(40 MG) BY MOUTH TWICE DAILY 180 tablet 1  . metoprolol succinate (TOPROL-XL) 50 MG 24 hr tablet TAKE 1 TABLET BY MOUTH EVERY DAY, TAKE WITH OR IMMEDIATELY FOLLOWING A MEAL 90 tablet 0   No current facility-administered medications on file prior to visit.

## 2021-01-19 ENCOUNTER — Ambulatory Visit: Payer: Medicare Other | Admitting: Cardiology

## 2021-01-20 NOTE — Progress Notes (Signed)
Follow up visit  Subjective:   Rachel Duncan, female    DOB: May 21, 1981, 40 y.o.   MRN: [id number]   Chief Complaint  Patient presents with  . Congestive Heart Failure  . Follow-up    76 month    40 year old African American female with nonischemic cardiomyopathy status post cardiac arrest in December 2017, ICD placement for secondary prevention, now with BRCA 1 positive breast cancer.  She is doing well, denies chest pain, shortness of breath, palpitations, leg edema, orthopnea, PND, TIA/syncope.  Reviewed recent echocardiogram with the patient, details below.    Current Outpatient Medications on File Prior to Visit  Medication Sig Dispense Refill  . ENTRESTO 97-103 MG TAKE 1 TABLET BY MOUTH TWICE DAILY 180 tablet 3  . furosemide (LASIX) 40 MG tablet TAKE 1 TABLET(40 MG) BY MOUTH TWICE DAILY 180 tablet 1  . metoprolol succinate (TOPROL-XL) 50 MG 24 hr tablet TAKE 1 TABLET BY MOUTH EVERY DAY, TAKE WITH OR IMMEDIATELY FOLLOWING A MEAL 90 tablet 0   No current facility-administered medications on file prior to visit.    Cardiovascular studies:  EKG 01/21/2021: Sinus rhythm 69 bpm Lead loss V4  Echocardiogram 12/31/2020:  Normal LV systolic function with visual EF 50-55%. Left ventricle cavity  is normal in size. Normal global wall motion. Normal diastolic filling  pattern, normal LAP.  Left atrial cavity is mildly dilated.  Mild to moderate mitral regurgitation.  Mild tricuspid regurgitation. No evidence of pulmonary hypertension.  Mild to moderate pulmonic regurgitation.  Compared to prior study dated 06/12/2019: LVEF was 45-50% now 50-55%, Mild  TR is new, no PHTN.   EKG 05/06/2020: Sinus rhythm 77 bpm Frequent pvcs  Scheduled Remote ICD check 02/25/2020:  This is a normal S-ICD follow-up. There we11re 0 episodes detected in the "Shock" zone and 0 episodes detected in the "Conditional Shock" zone. 65 % projected remaining battery capacity. Battery longevity is  BOS   Recent labs: 10/10/2020: Glucose 93, BUN/Cr 17/1.01. EGFR >60. Na/K 137/3.8. Rest of the CMP normal H/H 12/40. MCV 94. Platelets 211    Review of Systems  Cardiovascular: Negative for chest pain, dyspnea on exertion, leg swelling, palpitations and syncope.         Vitals:   01/21/21 0921  BP: 124/78  Pulse: 70  Resp: 16  Temp: 98.7 F (37.1 C)  SpO2: 98%     Body mass index is 32.74 kg/m. Filed Weights   01/21/21 0921  Weight: 179 lb (81.2 kg)    Objective:   Physical Exam Vitals and nursing note reviewed.  Constitutional:      General: She is not in acute distress. Neck:     Vascular: No JVD.  Cardiovascular:     Rate and Rhythm: Normal rate and regular rhythm.     Pulses: Intact distal pulses.     Heart sounds: Normal heart sounds. No murmur heard.   Pulmonary:     Effort: Pulmonary effort is normal.     Breath sounds: Normal breath sounds. No wheezing or rales.         Assessment & Recommendations:   40 year old African American female with nonischemic cardiomyopathy status post cardiac arrest in December 2017, ICD placement for secondary prevention, now with BRCA 1 positive breast cancer.  HFrEF: Nonischemic cardiomyopathy.  Her EF has remained relatively steady post chemotherapy.  Clinically euvolumic today. Continue Entresto 97-103 mg bid, metoprolol succinate 50 mg daily,. Will check lipid panel today for screening  She will need ICD evaluation  F/u in 1 year  Nigel Mormon, MD [facility] [place] Pager: [phone] Office: [phone] If no answer Cell [phone]

## 2021-01-21 ENCOUNTER — Encounter: Payer: Self-pay | Admitting: Cardiology

## 2021-01-21 ENCOUNTER — Ambulatory Visit: Payer: Medicare Other | Admitting: Cardiology

## 2021-01-21 ENCOUNTER — Other Ambulatory Visit: Payer: Self-pay

## 2021-01-21 VITALS — BP 124/78 | HR 70 | Temp 98.7°F | Resp 16 | Ht 62.0 in | Wt 179.0 lb

## 2021-01-21 DIAGNOSIS — Z95810 Presence of automatic (implantable) cardiac defibrillator: Secondary | ICD-10-CM

## 2021-01-21 DIAGNOSIS — Z13220 Encounter for screening for lipoid disorders: Secondary | ICD-10-CM

## 2021-01-21 DIAGNOSIS — I428 Other cardiomyopathies: Secondary | ICD-10-CM

## 2021-01-21 DIAGNOSIS — Z8674 Personal history of sudden cardiac arrest: Secondary | ICD-10-CM

## 2021-01-21 DIAGNOSIS — I5022 Chronic systolic (congestive) heart failure: Secondary | ICD-10-CM

## 2021-01-22 LAB — LIPID PANEL
Chol/HDL Ratio: 2.7 ratio (ref 0.0–4.4)
Cholesterol, Total: 173 mg/dL (ref 100–199)
HDL: 63 mg/dL (ref >39)
LDL Chol Calc (NIH): 95 mg/dL (ref 0–99)
Triglycerides: 78 mg/dL (ref 0–149)
VLDL Cholesterol Cal: 15 mg/dL (ref 5–40)

## 2021-01-26 ENCOUNTER — Encounter: Payer: Medicare Other | Admitting: Cardiology

## 2021-01-27 ENCOUNTER — Other Ambulatory Visit: Payer: Self-pay

## 2021-01-27 ENCOUNTER — Ambulatory Visit: Payer: Medicare Other | Admitting: Cardiology

## 2021-01-27 DIAGNOSIS — Z4502 Encounter for adjustment and management of automatic implantable cardiac defibrillator: Secondary | ICD-10-CM

## 2021-01-27 DIAGNOSIS — Z95810 Presence of automatic (implantable) cardiac defibrillator: Secondary | ICD-10-CM

## 2021-01-27 DIAGNOSIS — I4901 Ventricular fibrillation: Secondary | ICD-10-CM

## 2021-01-27 DIAGNOSIS — I428 Other cardiomyopathies: Secondary | ICD-10-CM

## 2021-01-27 NOTE — Progress Notes (Signed)
Chief Complaint  Patient presents with  . Pacemaker Check    ICD check and upgrade of software     In office ICD Check 01/27/2021: There were 0 episodes detected in the "shock" zone and 0 episodes detected in the conditional shock zone. 55% projected remaining battery capacity.  Longevity is BOS Shock impedance: 80 Normal ICD function. No significant arrhythmias.   Patient presents to the office today for ICD check.  During today's visit Joey, Rep with [employer], gave patient a new 4G adapter for home monitor box. Patient encouraged to do regular remote patient monitoring.   Adrian Prows, MD, [facility] 01/29/2021, [place] PM Office: [phone] Pager: [phone]

## 2021-01-29 ENCOUNTER — Encounter: Payer: Self-pay | Admitting: Cardiology

## 2021-02-28 ENCOUNTER — Other Ambulatory Visit: Payer: Self-pay | Admitting: Cardiology

## 2021-02-28 DIAGNOSIS — I5022 Chronic systolic (congestive) heart failure: Secondary | ICD-10-CM

## 2021-03-08 ENCOUNTER — Other Ambulatory Visit: Payer: Self-pay | Admitting: Cardiology

## 2021-03-08 DIAGNOSIS — I5022 Chronic systolic (congestive) heart failure: Secondary | ICD-10-CM

## 2021-04-30 ENCOUNTER — Telehealth: Payer: Self-pay | Admitting: Cardiology

## 2021-05-06 ENCOUNTER — Ambulatory Visit: Payer: Medicare Other | Admitting: Cardiology

## 2021-05-12 NOTE — Telephone Encounter (Signed)
Called patient, NA, LMAM

## 2021-05-16 NOTE — Telephone Encounter (Signed)
I called every number on file, NA, LMAM

## 2021-08-24 ENCOUNTER — Other Ambulatory Visit: Payer: Self-pay | Admitting: Cardiology

## 2021-08-24 DIAGNOSIS — I5022 Chronic systolic (congestive) heart failure: Secondary | ICD-10-CM

## 2021-08-31 ENCOUNTER — Inpatient Hospital Stay (HOSPITAL_COMMUNITY): Payer: Medicare Other

## 2021-08-31 ENCOUNTER — Inpatient Hospital Stay (HOSPITAL_COMMUNITY)
Admission: AD | Admit: 2021-08-31 | Discharge: 2021-09-01 | DRG: 309 | Disposition: A | Discharge status: Home / Self Care | Payer: Medicare Other | Source: Other Acute Inpatient Hospital | Attending: Cardiology | Admitting: Cardiology

## 2021-08-31 DIAGNOSIS — R7401 Elevation of levels of liver transaminase levels: Secondary | ICD-10-CM | POA: Diagnosis present

## 2021-08-31 DIAGNOSIS — Z20822 Contact with and (suspected) exposure to covid-19: Secondary | ICD-10-CM | POA: Diagnosis present

## 2021-08-31 DIAGNOSIS — D708 Other neutropenia: Secondary | ICD-10-CM | POA: Diagnosis not present

## 2021-08-31 DIAGNOSIS — E876 Hypokalemia: Secondary | ICD-10-CM | POA: Diagnosis present

## 2021-08-31 DIAGNOSIS — I493 Ventricular premature depolarization: Secondary | ICD-10-CM | POA: Diagnosis present

## 2021-08-31 DIAGNOSIS — Z9013 Acquired absence of bilateral breasts and nipples: Secondary | ICD-10-CM | POA: Diagnosis not present

## 2021-08-31 DIAGNOSIS — M79605 Pain in left leg: Secondary | ICD-10-CM | POA: Diagnosis not present

## 2021-08-31 DIAGNOSIS — Z1501 Genetic susceptibility to malignant neoplasm of breast: Secondary | ICD-10-CM

## 2021-08-31 DIAGNOSIS — I428 Other cardiomyopathies: Secondary | ICD-10-CM | POA: Diagnosis present

## 2021-08-31 DIAGNOSIS — R079 Chest pain, unspecified: Secondary | ICD-10-CM | POA: Diagnosis not present

## 2021-08-31 DIAGNOSIS — R0602 Shortness of breath: Secondary | ICD-10-CM | POA: Diagnosis not present

## 2021-08-31 DIAGNOSIS — M79604 Pain in right leg: Secondary | ICD-10-CM | POA: Diagnosis not present

## 2021-08-31 DIAGNOSIS — Z8674 Personal history of sudden cardiac arrest: Secondary | ICD-10-CM | POA: Diagnosis not present

## 2021-08-31 DIAGNOSIS — Z79899 Other long term (current) drug therapy: Secondary | ICD-10-CM | POA: Diagnosis not present

## 2021-08-31 DIAGNOSIS — Z8249 Family history of ischemic heart disease and other diseases of the circulatory system: Secondary | ICD-10-CM

## 2021-08-31 DIAGNOSIS — Z803 Family history of malignant neoplasm of breast: Secondary | ICD-10-CM

## 2021-08-31 DIAGNOSIS — I4729 Other ventricular tachycardia: Secondary | ICD-10-CM | POA: Diagnosis present

## 2021-08-31 DIAGNOSIS — Z9851 Tubal ligation status: Secondary | ICD-10-CM

## 2021-08-31 DIAGNOSIS — R55 Syncope and collapse: Secondary | ICD-10-CM | POA: Diagnosis not present

## 2021-08-31 DIAGNOSIS — Z720 Tobacco use: Secondary | ICD-10-CM | POA: Diagnosis present

## 2021-08-31 DIAGNOSIS — Z87891 Personal history of nicotine dependence: Secondary | ICD-10-CM | POA: Diagnosis not present

## 2021-08-31 DIAGNOSIS — I4721 Torsades de pointes: Secondary | ICD-10-CM

## 2021-08-31 DIAGNOSIS — Z853 Personal history of malignant neoplasm of breast: Secondary | ICD-10-CM

## 2021-08-31 DIAGNOSIS — Z95810 Presence of automatic (implantable) cardiac defibrillator: Secondary | ICD-10-CM

## 2021-08-31 DIAGNOSIS — D72819 Decreased white blood cell count, unspecified: Secondary | ICD-10-CM | POA: Diagnosis present

## 2021-08-31 DIAGNOSIS — I472 Ventricular tachycardia, unspecified: Principal | ICD-10-CM | POA: Diagnosis present

## 2021-08-31 LAB — COMPREHENSIVE METABOLIC PANEL
ALT: 63 U/L — ABNORMAL HIGH (ref 0–44)
AST: 70 U/L — ABNORMAL HIGH (ref 15–41)
Albumin: 3.8 g/dL (ref 3.5–5.0)
Alkaline Phosphatase: 83 U/L (ref 38–126)
Anion gap: 11 (ref 5–15)
BUN: <5 mg/dL — ABNORMAL LOW (ref 6–20)
CO2: 26 mmol/L (ref 22–32)
Calcium: 9.5 mg/dL (ref 8.9–10.3)
Chloride: 98 mmol/L (ref 98–111)
Creatinine, Ser: 0.81 mg/dL (ref 0.44–1.00)
GFR, Estimated: >60 mL/min (ref >60)
Glucose, Bld: 102 mg/dL — ABNORMAL HIGH (ref 70–99)
Potassium: 3.6 mmol/L (ref 3.5–5.1)
Sodium: 135 mmol/L (ref 135–145)
Total Bilirubin: 0.8 mg/dL (ref 0.3–1.2)
Total Protein: 7.6 g/dL (ref 6.5–8.1)

## 2021-08-31 LAB — CBC WITH DIFFERENTIAL/PLATELET
Abs Immature Granulocytes: 0 10*3/uL (ref 0.00–0.07)
Basophils Absolute: 0 10*3/uL (ref 0.0–0.1)
Basophils Relative: 1 %
Eosinophils Absolute: 0 10*3/uL (ref 0.0–0.5)
Eosinophils Relative: 2 %
HCT: 37.2 % (ref 36.0–46.0)
Hemoglobin: 13 g/dL (ref 12.0–15.0)
Lymphocytes Relative: 37 %
Lymphs Abs: 0.7 10*3/uL (ref 0.7–4.0)
MCH: 34.2 pg — ABNORMAL HIGH (ref 26.0–34.0)
MCHC: 34.9 g/dL (ref 30.0–36.0)
MCV: 97.9 fL (ref 80.0–100.0)
Monocytes Absolute: 0.2 10*3/uL (ref 0.1–1.0)
Monocytes Relative: 10 %
Neutro Abs: 1 10*3/uL — ABNORMAL LOW (ref 1.7–7.7)
Neutrophils Relative %: 50 %
Platelets: 162 10*3/uL (ref 150–400)
RBC: 3.8 MIL/uL — ABNORMAL LOW (ref 3.87–5.11)
RDW: 16.1 % — ABNORMAL HIGH (ref 11.5–15.5)
WBC: 1.9 10*3/uL — ABNORMAL LOW (ref 4.0–10.5)
nRBC: 0 % (ref 0.0–0.2)
nRBC: 0 /100 WBC

## 2021-08-31 LAB — CBC
HCT: 40.2 % (ref 36.0–46.0)
Hemoglobin: 13.7 g/dL (ref 12.0–15.0)
MCH: 33.4 pg (ref 26.0–34.0)
MCHC: 34.1 g/dL (ref 30.0–36.0)
MCV: 98 fL (ref 80.0–100.0)
Platelets: 188 10*3/uL (ref 150–400)
RBC: 4.1 MIL/uL (ref 3.87–5.11)
RDW: 16.3 % — ABNORMAL HIGH (ref 11.5–15.5)
WBC: 2.1 10*3/uL — ABNORMAL LOW (ref 4.0–10.5)
nRBC: 0 % (ref 0.0–0.2)

## 2021-08-31 LAB — BRAIN NATRIURETIC PEPTIDE: B Natriuretic Peptide: 192.8 pg/mL — ABNORMAL HIGH (ref 0.0–100.0)

## 2021-08-31 LAB — ECHOCARDIOGRAM COMPLETE
Area-P 1/2: 2.34 cm2
Height: 63 in
S' Lateral: 4 cm
Single Plane A4C EF: 40.7 %
Weight: 2539.2 oz

## 2021-08-31 LAB — HIV ANTIBODY (ROUTINE TESTING W REFLEX): HIV Screen 4th Generation wRfx: NONREACTIVE

## 2021-08-31 LAB — MAGNESIUM: Magnesium: 2.5 mg/dL — ABNORMAL HIGH (ref 1.7–2.4)

## 2021-08-31 MED ORDER — FUROSEMIDE 40 MG PO TABS
40.0000 mg | ORAL_TABLET | Freq: Every day | ORAL | Status: DC
  Administered 2021-08-31: 40 mg via ORAL
  Filled 2021-08-31: qty 1

## 2021-08-31 MED ORDER — METOPROLOL SUCCINATE ER 50 MG PO TB24
50.0000 mg | ORAL_TABLET | Freq: Every day | ORAL | Status: DC
  Administered 2021-08-31 – 2021-09-01 (×2): 50 mg via ORAL
  Filled 2021-08-31 (×3): qty 1

## 2021-08-31 MED ORDER — POTASSIUM CHLORIDE CRYS ER 20 MEQ PO TBCR
40.0000 meq | EXTENDED_RELEASE_TABLET | Freq: Once | ORAL | Status: AC
  Administered 2021-08-31: 40 meq via ORAL
  Filled 2021-08-31: qty 2

## 2021-08-31 MED ORDER — POTASSIUM CHLORIDE CRYS ER 20 MEQ PO TBCR: 40.0000 meq | EXTENDED_RELEASE_TABLET | Freq: Two times a day (BID) | ORAL | Status: DC

## 2021-08-31 MED ORDER — HEPARIN SODIUM (PORCINE) 5000 UNIT/ML IJ SOLN
5000.0000 [IU] | Freq: Three times a day (TID) | INTRAMUSCULAR | Status: DC
  Administered 2021-08-31 – 2021-09-01 (×2): 5000 [IU] via SUBCUTANEOUS
  Filled 2021-08-31 (×2): qty 1

## 2021-08-31 MED ORDER — SACUBITRIL-VALSARTAN 97-103 MG PO TABS
1.0000 | ORAL_TABLET | Freq: Two times a day (BID) | ORAL | Status: DC
  Administered 2021-08-31 – 2021-09-01 (×2): 1 via ORAL
  Filled 2021-08-31 (×3): qty 1

## 2021-08-31 NOTE — Progress Notes (Signed)
  Echocardiogram 2D Echocardiogram has been performed.  Rachel Duncan 08/31/2021, 4:20 PM

## 2021-08-31 NOTE — H&P (Addendum)
Rachel Duncan is an 40 y.o. female.   Chief Complaint: Polymorphic ventricular tachycardia HPI:   40 year old African American female with nonischemic cardiomyopathy status post cardiac arrest in December 2017, ICD placement for secondary prevention, h/o BRCA 1 positive breast cancer, now admitted with syncope, polymorphic ventricular tachycardia  Admission information provided to me prior to her transfer suggested that patient did not get shocked for these polymorphic ventricular tachycardia episodes.  However, patient tells me that she had been shocked at least couple times, both at home and during hospital stay around the hospital.  This was confirmed by deciliter rotation.  She reports that she has been feeling sick and unwell for last several days.  She had at least 1 episode of fever 2 days ago.  She has not been able to eat anything due to that.  By the time cough, her potassium and magnesium were low at 2.8 and 1.4 respectively.  Per records, she received 40 mill equivalents p.o. and 10 mEq IV of potassium, and 4 g of magnesium.  Patient currently denies any symptoms.  Further review of the records shows elevated D-dimer of 2000.  It appears that patient underwent CT angiogram chest at [facility], results not available to me at this time.  Of note, she was tested negative for COVID.  She denies any chest pain or shortness of breath symptoms at this time.  08/31/2021: Glucose 137, BUN/Cr 5/0.7. EGFR >60. Na/K 132/2.8. Mg 1.4. AST/ALT 100/60. Rest of the CMP normal. WBC count 2.2K H/H 13/38. MCV 98. Platelets 162 Trop I <0.01. BNP 160 (Normal) D-dimer 2002 High   Past Medical History:  Diagnosis Date   Acute respiratory failure with hypoxia (HCC)    Anoxic encephalopathy (HCC)    Bradycardia    BRCA1 genetic carrier    Breast cancer ([place]) 12/2018   stage IIIB   Cardiac arrest [facility])    Encephalopathy    Encounter for assessment of implantable cardioverter-defibrillator (ICD)  08/28/2019   H/O cardiac arrest 06/25/2019   ICD  [employer] Subcutaneous Emblem MRI ICD implantation 01/12/2017 01/12/2017   Scheduled Remote ICD check 07/25/2020:  This is a normal S-ICD follow-up. There were 0 episodes detected in the "Shock" zone and 0 episodes detected in the "Conditional Shock" zone. 61 % projected remaining battery capacity. Battery longevity is BOS   ICD [employer] Single chamber subcutaneous ICD 01/12/2017   Remote ICD check   10.5.20: There were 0 episodes detected in the "Shock" zone and 0 episodes detected in the "Conditional Shock" zone. 73 % projected remaining battery capacity. Battery longevity is not reported . RV pacing is <0.1 %.   Seizures ([place])    Ventricular fibrillation [facility])     Past Surgical History:  Procedure Laterality Date   BREAST LUMPECTOMY Left 2020   BREAST RECONSTRUCTION Bilateral    CARDIAC CATHETERIZATION N/A 10/21/2016   Procedure: Left Heart Cath and Coronary Angiography;  Surgeon: Adrian Prows, MD;  Location: [place] CV LAB;  Service: Cardiovascular;  Laterality: N/A;   CESAREAN SECTION W/BTL  11/2017   MASTECTOMY W/ SENTINEL NODE BIOPSY Bilateral    SUBQ ICD IMPLANT N/A 01/12/2017   Procedure: SubQ ICD Implant;  Surgeon: Evans Lance, MD;  Location: [place] CV LAB;  Service: Cardiovascular;  Laterality: N/A;   TUBAL LIGATION       Family History  Problem Relation Age of Onset   Heart attack Father    Breast cancer Maternal Aunt     Social  History:  reports that she quit smoking about 4 years ago. Her smoking use included cigarettes. She has a 7.50 pack-year smoking history. She has never used smokeless tobacco. She reports that she does not currently use alcohol. She reports that she does not use drugs.  Allergies: No Known Allergies  Review of Systems  Constitutional: Positive for decreased appetite and malaise/fatigue. Negative for weight gain and weight loss.  HENT:  Negative for congestion.   Eyes:   Negative for visual disturbance.  Cardiovascular:  Positive for palpitations. Negative for chest pain, dyspnea on exertion, leg swelling and syncope.  Respiratory:  Negative for cough.   Endocrine: Negative for cold intolerance.  Hematologic/Lymphatic: Does not bruise/bleed easily.  Skin:  Negative for itching and rash.  Musculoskeletal:  Negative for myalgias.  Gastrointestinal:  Negative for abdominal pain, nausea and vomiting.  Genitourinary:  Negative for dysuria.  Neurological:  Negative for dizziness and weakness.  Psychiatric/Behavioral:  The patient is not nervous/anxious.   All other systems reviewed and are negative.   Blood pressure (!) 136/92, pulse 65, temperature 98.5 F (36.9 C), temperature source Oral, resp. rate 17, height 5\' 3"  (1.6 m), weight 72 kg, SpO2 100 %, unknown if currently breastfeeding. Body mass index is 28.11 kg/m.  Physical Exam Vitals and nursing note reviewed.  Constitutional:      General: She is not in acute distress.    Appearance: She is well-developed.  HENT:     Head: Normocephalic and atraumatic.  Eyes:     Conjunctiva/sclera: Conjunctivae normal.     Pupils: Pupils are equal, round, and reactive to light.  Neck:     Vascular: No JVD.  Cardiovascular:     Rate and Rhythm: Normal rate and regular rhythm.     Pulses: Normal pulses and intact distal pulses.     Heart sounds: No murmur heard. Pulmonary:     Effort: Pulmonary effort is normal.     Breath sounds: Normal breath sounds. No wheezing or rales.  Abdominal:     General: Bowel sounds are normal.     Palpations: Abdomen is soft.     Tenderness: There is no rebound.  Musculoskeletal:        General: No tenderness. Normal range of motion.     Right lower leg: No edema.     Left lower leg: No edema.  Lymphadenopathy:     Cervical: No cervical adenopathy.  Skin:    General: Skin is warm and dry.  Neurological:     Mental Status: She is alert and oriented to person, place, and  time.     Cranial Nerves: No cranial nerve deficit.    No results found for this or any previous visit (from the past 48 hour(s)).  Labs:   Lab Results  Component Value Date   WBC 4.5 10/05/2020   HGB 12.7 10/05/2020   HCT 40.0 10/05/2020   MCV 94.6 10/05/2020   PLT 211 10/05/2020   No results for input(s): NA, K, CL, CO2, BUN, CREATININE, CALCIUM, PROT, BILITOT, ALKPHOS, ALT, AST, GLUCOSE in the last 168 hours.  Invalid input(s): LABALBU  Lipid Panel     Component Value Date/Time   CHOL 173 01/21/2021 1027   TRIG 78 01/21/2021 1027   HDL 63 01/21/2021 1027   CHOLHDL 2.7 01/21/2021 1027   CHOLHDL 3.5 12/11/2016 1117   VLDL 10 12/11/2016 1117   LDLCALC 95 01/21/2021 1027     HEMOGLOBIN A1C Lab Results  Component Value Date  HGBA1C 5.5 12/11/2016     Medications Prior to Admission  Medication Sig Dispense Refill   ENTRESTO 97-103 MG TAKE 1 TABLET BY MOUTH TWICE DAILY 180 tablet 3   furosemide (LASIX) 40 MG tablet TAKE 1 TABLET(40 MG) BY MOUTH TWICE DAILY 180 tablet 1   metoprolol succinate (TOPROL-XL) 50 MG 24 hr tablet TAKE 1 TABLET BY MOUTH EVERY DAY WITH OR IMMEDIATELY FOLLOWING A MEAL 90 tablet 0      Current Facility-Administered Medications:    furosemide (LASIX) tablet 40 mg, 40 mg, Oral, Daily, Kwaku Mostafa J, MD   heparin injection 5,000 Units, 5,000 Units, Subcutaneous, Q8H, Rasean Joos J, MD   metoprolol succinate (TOPROL-XL) 24 hr tablet 50 mg, 50 mg, Oral, Daily, Tamsen Reist J, MD   sacubitril-valsartan (ENTRESTO) 97-103 mg per tablet, 1 tablet, Oral, BID, Kimberlie Csaszar J, MD   Today's Vitals   08/31/21 1325 08/31/21 1333  BP: (!) 136/92   Pulse: 65   Resp: 17   Temp: 98.5 F (36.9 C)   TempSrc: Oral   SpO2: 100%   Weight:  72 kg  Height:  $Remove'5\' 3"'XuHKnOA$  (1.6 m)  PainSc:  0-No pain   Body mass index is 28.11 kg/m.   CARDIAC STUDIES:  EKG 08/31/2021: Sinus rhythm 63 bpm.  Prolonged QTc interval at 546  ms  Echocardiogram 12/31/2020:  Normal LV systolic function with visual EF 50-55%. Left ventricle cavity  is normal in size. Normal global wall motion. Normal diastolic filling  pattern, normal LAP.  Left atrial cavity is mildly dilated.  Mild to moderate mitral regurgitation.  Mild tricuspid regurgitation. No evidence of pulmonary hypertension.  Mild to moderate pulmonic regurgitation.  Compared to prior study dated 06/12/2019: LVEF was 45-50% now 50-55%, Mild  TR is new, no PHTN.    Assessment/Plan  40 year old African American female with nonischemic cardiomyopathy status post cardiac arrest in December 2017, ICD placement for secondary prevention, h/o BRCA 1 positive breast cancer, now admitted with syncope, polymorphic ventricular tachycardia in the setting of severe hypokalemia and hypomagnesemia.  Polymorphic ventricular tachycardia: Appropriately shocked by ICD. Etiology hypokalemia and hypomagnesemia, likely secondary to poor p.o. intake. CMP, magnesium check pending.  Replace accordingly to K4, Mg 2. Normal troponin and BNP.  Echocardiogram pending.  No acute congestive heart failure. Appreciate EP consult.  Elevated D-dimer: Negative for COVID.  [facility]A result pending (performed at [facility]) Not hypoxic. Hemodynamically stable.   Need to evaluate for infectious or inflammatory etiology.  We will request medicine consult and potentially transfer of care for further work-up.   Nigel Mormon, MD Pager: [phone] Office: [phone]

## 2021-08-31 NOTE — Consult Note (Addendum)
Cardiology Consultation:   Rachel ID: Rachel Duncan MRN: [id number]; DOB: 08-Feb-1981  Admit date: 08/31/2021 Date of Consult: 08/31/2021  PCP:  Rachel, No Pcp Per (Inactive)   [facility] Providers Cardiologist:  Theotis Burrow   Rachel Profile:   Rachel Duncan is a 40 y.o. female with a hx of VF arrest  2017, NICM (of unclear etiology, no MI, associated with anoxic encephalopathy post arrest) w/S-ICD implant 2018, breast cancer (chemo with docetaxel/cyclophosphamide  and b/l mastectomy 2020), who is being seen 08/31/2021 for the evaluation of torsades at the request of Dr. Virgina Jock.  History of Present Illness:   Rachel Duncan has been following regularly with Dr. Virgina Jock since Rachel Duncan arrest/ICD implant back in 2018 and has done well without arrhythmia. Rachel Duncan has had normalization of Rachel Duncan LVEF to 50-55% by Rachel Duncan TTE in Feb this  year  Rachel Duncan was transferred from [place] there that Rachel Duncan/Rachel Duncan reported several episodes of waking in the middle of the night shaking/stiffening, lasting 10-20 seconds recurrently at the. Preceding this Rachel Duncan has not felt well, poor oral intake with headache/body aches, low grade fevers.   Rachel Duncan was observed to have torsades in the monitor in the ER.  Record is not clear, but the Rachel Duncan Rachel Duncan was shocked at home a couple times and once at [facility] ER record from [place] does not discuss this in detail   [place] records reviewed LABS 08/31/21 K+ 2.8 Mag 1.4 BUN/Creat 5/0.7 WBC 2.2 H/H 13/38 Plts 162  BNP 160 Urine Preg negative D dimer 2002 (looks like CT angio chest was done, I can find the result)  Resp panel/COVID neg  Looks like Rachel Duncan got  as far as I can tell Mag 4gm IV total Potassium IV 23meq x1, 78meq PO x1   Past Medical History:  Diagnosis Date   Acute respiratory failure with hypoxia ([place])    Anoxic encephalopathy ([place])    Bradycardia    BRCA1 genetic carrier    Breast cancer ([place]) 12/2018   stage IIIB   Cardiac arrest  [facility])    Encephalopathy    Encounter for assessment of implantable cardioverter-defibrillator (ICD) 08/28/2019   H/O cardiac arrest 06/25/2019   ICD  [employer] Subcutaneous Emblem MRI ICD implantation 01/12/2017 01/12/2017   Scheduled Remote ICD check 07/25/2020:  This is a normal S-ICD follow-up. There were 0 episodes detected in the "Shock" zone and 0 episodes detected in the "Conditional Shock" zone. 61 % projected remaining battery capacity. Battery longevity is BOS   ICD [employer] Single chamber subcutaneous ICD 01/12/2017   Remote ICD check   10.5.20: There were 0 episodes detected in the "Shock" zone and 0 episodes detected in the "Conditional Shock" zone. 73 % projected remaining battery capacity. Battery longevity is not reported . RV pacing is <0.1 %.   Seizures ([place])    Ventricular fibrillation [facility])     Past Surgical History:  Procedure Laterality Date   BREAST LUMPECTOMY Left 2020   BREAST RECONSTRUCTION Bilateral    CARDIAC CATHETERIZATION N/A 10/21/2016   Procedure: Left Heart Cath and Coronary Angiography;  Surgeon: Adrian Prows, MD;  Location: [place] CV LAB;  Service: Cardiovascular;  Laterality: N/A;   CESAREAN SECTION W/BTL  11/2017   MASTECTOMY W/ SENTINEL NODE BIOPSY Bilateral    SUBQ ICD IMPLANT N/A 01/12/2017   Procedure: SubQ ICD Implant;  Surgeon: Evans Lance, MD;  Location: [place] CV LAB;  Service: Cardiovascular;  Laterality: N/A;   TUBAL LIGATION  Home Medications:  Prior to Admission medications   Medication Sig Start Date End Date Taking? Authorizing Provider  ENTRESTO 97-103 MG TAKE 1 TABLET BY MOUTH TWICE DAILY 02/28/21   Patwardhan, Manish J, MD  furosemide (LASIX) 40 MG tablet TAKE 1 TABLET(40 MG) BY MOUTH TWICE DAILY 08/24/21   Patwardhan, Manish J, MD  metoprolol succinate (TOPROL-XL) 50 MG 24 hr tablet TAKE 1 TABLET BY MOUTH EVERY DAY WITH OR IMMEDIATELY FOLLOWING A MEAL 08/24/21   Patwardhan, Reynold Bowen, MD    Inpatient  Medications: Scheduled Meds:  furosemide  40 mg Oral Daily   heparin  5,000 Units Subcutaneous Q8H   metoprolol succinate  50 mg Oral Daily   sacubitril-valsartan  1 tablet Oral BID   Continuous Infusions:  PRN Meds:   Allergies:   No Known Allergies  Social History:   Social History   Socioeconomic History   Marital status: Single    Spouse name: Not on file   Number of children: 5   Years of education: 12   Highest education level: 11th grade  Occupational History   Not on file  Tobacco Use   Smoking status: Former    Packs/day: 0.50    Years: 15.00    Pack years: 7.50    Types: Cigarettes    Quit date: 09/24/2016    Years since quitting: 4.9   Smokeless tobacco: Never   Tobacco comments:    started smoking at age 53  Vaping Use   Vaping Use: Never used  Substance and Sexual Activity   Alcohol use: Not Currently   Drug use: No   Sexual activity: Not Currently    Birth control/protection: None  Other Topics Concern   Not on file  Social History Narrative   Not on file   Social Determinants of Health   Financial Resource Strain: Not on file  Food Insecurity: Not on file  Transportation Needs: Not on file  Physical Activity: Not on file  Stress: Not on file  Social Connections: Not on file  Intimate Partner Violence: Not on file    Family History:   Family History  Problem Relation Age of Onset   Heart attack Father    Breast cancer Maternal Aunt      ROS:  Please see the history of present illness.  All other ROS reviewed and negative.     Physical Exam/Data:   Vitals:   08/31/21 1325 08/31/21 1333  BP: (!) 136/92   Pulse: 65   Resp: 17   Temp: 98.5 F (36.9 C)   TempSrc: Oral   SpO2: 100%   Weight:  72 kg  Height:  $Remove'5\' 3"'OIazXIp$  (1.6 m)   No intake or output data in the 24 hours ending 08/31/21 1430 Last 3 Weights 08/31/2021 01/21/2021 10/05/2020  Weight (lbs) 158 lb 11.2 oz 179 lb 185 lb 1.6 oz  Weight (kg) 71.986 kg 81.194 kg 83.961 kg      Body mass index is 28.11 kg/m.  General:  Well nourished, well developed, in no acute distress HEENT: normal Neck: no JVD Vascular: No carotid bruits; Distal pulses 2+ bilaterally Cardiac:  RRR; no murmurs, gallops or rubs Lungs:  CTA b/l, no wheezing, rhonchi or rales  Abd: soft, nontender  Ext: no edema Musculoskeletal:  No deformities Skin: warm and dry  Neuro:  no focal abnormalities noted Psych:  Normal affect   EKG:  The EKG was personally reviewed and demonstrates:   Hart: SR, PVCs some closely coupled Twaves,  QT is difficult to evaluate with the ectopy though visually is long Here SR, 63bpm, QTc 564ms  Telemetry:  Telemetry was personally reviewed and demonstrates:   SR  Relevant CV Studies:  Echocardiogram 12/31/2020:  Normal LV systolic function with visual EF 50-55%. Left ventricle cavity  is normal in size. Normal global wall motion. Normal diastolic filling  pattern, normal LAP.  Left atrial cavity is mildly dilated.  Mild to moderate mitral regurgitation.  Mild tricuspid regurgitation. No evidence of pulmonary hypertension.  Mild to moderate pulmonic regurgitation.  Compared to prior study dated 06/12/2019: LVEF was 45-50% now 50-55%, Mild  TR is new, no PHTN.   Laboratory Data:  High Sensitivity Troponin:  No results for input(s): TROPONINIHS in the last 720 hours.   ChemistryNo results for input(s): NA, K, CL, CO2, GLUCOSE, BUN, CREATININE, CALCIUM, MG, GFRNONAA, GFRAA, ANIONGAP in the last 168 hours.  No results for input(s): PROT, ALBUMIN, AST, ALT, ALKPHOS, BILITOT in the last 168 hours. Lipids No results for input(s): CHOL, TRIG, HDL, LABVLDL, LDLCALC, CHOLHDL in the last 168 hours.  HematologyNo results for input(s): WBC, RBC, HGB, HCT, MCV, MCH, MCHC, RDW, PLT in the last 168 hours. Thyroid No results for input(s): TSH, FREET4 in the last 168 hours.  BNPNo results for input(s): BNP, PROBNP in the last 168 hours.  DDimer No results for input(s):  DDIMER in the last 168 hours.   Radiology/Studies:  No results found.   Assessment and Plan:   Torsades Prolonged QT In the environment of vague illness at home for a few days and poor intake Severe hypokalemia and hypomagnesemia D dimer very high, reportedly had a CT, result is not available  Rachel Duncan is feeling better Telemetry is SR  Repeat chemistry profile has been drawn is pending Verbally reported by industry rep (report not available), Rachel Duncan received 4 appropriate shocks today, exact timing unknown, but the pt recalls getting shocked while at [place] and a few at home  Dr. Virgina Jock, Rachel Duncan attending cardiologist Morrisa Aldaba see Rachel Duncan and address electrolytes and work up. Nothing further from EP at this time  Dr. Curt Bears has seen the Rachel Please recall if needed   Risk Assessment/Risk Scores:    For questions or updates, please contact [facility] Please consult [email or web address] for contact info under    Signed, Baldwin Jamaica, PA-C  08/31/2021 2:30 PM  I have seen and examined this Rachel with Tommye Standard.  Agree with above, note added to reflect my findings.  On exam, RRR, no murmurs.  Rachel has a history of VF arrest in 2017, nonischemic cardiomyopathy and is status post S ICD in 2018.  Rachel Duncan was at home trying to sleep this morning.  At around 1 AM, Rachel Duncan received multiple shocks from Rachel Duncan ICD.  Review of interrogation shows ventricular fibrillation.  Rachel Duncan then came to [facility] and had further episodes of ventricular fibrillation with ICD shocks per the Rachel.  Rachel Duncan currently feels well and has had no further shocks.  Initial labs showed low potassium and magnesium.  These are currently being repleted.  Rachel Duncan D-dimer is also elevated at 2000.  It is certainly possible that Rachel Duncan ICD shocks are due to hypokalemia and hypomagnesemia.  We Nikea Settle continue to replete these.  Rachel Duncan QT on Rachel Duncan ECG is quite long.  Once these are repleted, would repeat an ECG to ensure that Rachel Duncan QT has  normalized.  Aside from that, no further EP work-up is necessary.  Rachel Duncan Dhanvi Boesen need a discussion on driving  from Rachel Duncan primary cardiologist.  Tranika Scholler M. Dallie Patton MD 08/31/2021 3:30 PM

## 2021-08-31 NOTE — Consult Note (Addendum)
Medical Consultation   Rachel Duncan  SMO:[id number]  DOB: 02/05/1981  DOA: 08/31/2021  PCP: Patient, No Pcp Per (Inactive)   Outpatient Specialists:  cardiology: Dr. Einar Gip    Requesting physician: Dr. Virgina Jock  Reason for consultation: Neutropenia, fever, electrolyte abnormalities    History of Present Illness: Rachel Duncan is an 40 y.o. female with history of nonischemic cardiomyopathy status post cardiac arrest in December 2017, ICD placement for secondary prevention, h/o BRCA 1 positive breast cancer tobacco abuse that we have been asked to consult on for recent illness with leukopenia and electrolyte abnormalities in setting of ventricular tachycardia by Dr. Virgina Jock with cardiology. She was transferred from [facility].   This AM she was sleeping and felt something grab her in the chest and she couldn't catch her breath it happened 2 more times and she decided to go to the hospital.  In the ER she had another episode of this. She had a fever to 102 on Tuesday and she took motrin. She had no other fevers after this.  Her eyes hurt her as well. She had some coughing as well but this passed. She has had no dysuria urgency or frequency. She has had no diarrhea or stomach pain nausea or vomiting. She has had no headaches or vision changes. She has had poor PO intake over the past few days.   No other sick contacts that she is aware of.   She has hx of breast cancer in 2019 s/p bilateral mastectomy chemo and radiation. She is no longer on any treatment.   Review of Systems:   As per HPI otherwise 10 point review of systems negative.   Review of Systems  Constitutional:  Negative for diaphoresis, fatigue and fever.  HENT:  Negative for congestion, ear pain, facial swelling, mouth sores, sinus pressure, sinus pain and sore throat.   Eyes:  Negative for visual disturbance.  Respiratory:  Negative for cough and shortness of breath.   Cardiovascular:  Negative  for chest pain, palpitations and leg swelling.  Gastrointestinal:  Negative for abdominal pain, diarrhea, nausea and vomiting.  Genitourinary:  Negative for difficulty urinating, dysuria, frequency, hematuria, pelvic pain, urgency, vaginal bleeding and vaginal discharge.  Musculoskeletal:  Negative for back pain, neck pain and neck stiffness.  Skin:  Negative for rash.  Neurological:  Negative for dizziness, light-headedness and headaches.   Past Medical History: Past Medical History:  Diagnosis Date   Acute respiratory failure with hypoxia (HCC)    Anoxic encephalopathy (HCC)    Bradycardia    BRCA1 genetic carrier    Breast cancer ([place]) 12/2018   stage IIIB   Cardiac arrest ([place])    Encephalopathy    Encounter for assessment of implantable cardioverter-defibrillator (ICD) 08/28/2019   H/O cardiac arrest 06/25/2019   ICD  [employer] Subcutaneous Emblem MRI ICD implantation 01/12/2017 01/12/2017   Scheduled Remote ICD check 07/25/2020:  This is a normal S-ICD follow-up. There were 0 episodes detected in the "Shock" zone and 0 episodes detected in the "Conditional Shock" zone. 61 % projected remaining battery capacity. Battery longevity is BOS   ICD [employer] Single chamber subcutaneous ICD 01/12/2017   Remote ICD check   10.5.20: There were 0 episodes detected in the "Shock" zone and 0 episodes detected in the "Conditional Shock" zone. 73 % projected remaining battery capacity. Battery longevity is not reported . RV pacing is <0.1 %.   Seizures (  [place])    Ventricular fibrillation [facility])     Past Surgical History: Past Surgical History:  Procedure Laterality Date   BREAST LUMPECTOMY Left 2020   BREAST RECONSTRUCTION Bilateral    CARDIAC CATHETERIZATION N/A 10/21/2016   Procedure: Left Heart Cath and Coronary Angiography;  Surgeon: Adrian Prows, MD;  Location: [facility];  Service: Cardiovascular;  Laterality: N/A;   CESAREAN SECTION W/BTL  11/2017   MASTECTOMY W/  SENTINEL NODE BIOPSY Bilateral    SUBQ ICD IMPLANT N/A 01/12/2017   Procedure: SubQ ICD Implant;  Surgeon: Evans Lance, MD;  Location: [place] [facility];  Service: Cardiovascular;  Laterality: N/A;   TUBAL LIGATION       Allergies:  No Known Allergies   Social History:  reports that she quit smoking about 4 years ago. Her smoking use included cigarettes. She has a 7.50 pack-year smoking history. She has never used smokeless tobacco. She reports that she does not currently use alcohol. She reports that she does not use drugs.   Family History: Family History  Problem Relation Age of Onset   Heart attack Father    Breast cancer Maternal Aunt       Physical Exam: Vitals:   08/31/21 1325 08/31/21 1333 08/31/21 1629 08/31/21 1941  BP: (!) 136/92  (!) 143/96 139/88  Pulse: 65  63 62  Resp: $Remo'17  16 18  'HWbkM$ Temp: 98.5 F (36.9 C)  98.3 F (36.8 C) 98 F (36.7 C)  TempSrc: Oral  Oral Oral  SpO2: 100%  100% 100%  Weight:  72 kg    Height:  $Remove'5\' 3"'FylvKal$  (1.6 m)      Constitutional:  Alert and awake, oriented x3, not in any acute distress. Eyes: PERLA, EOMI, irises appear normal, anicteric sclera,  ENMT: external ears and nose appear normal, normal hearing            Lips appears normal, oropharynx mucosa, tongue, posterior pharynx appear normal  Neck: neck appears normal, no masses, normal ROM, no thyromegaly, no JVD  CVS: S1-S2 clear, no murmur rubs or gallops, no LE edema, normal pedal pulses. Mild reproducible pain over anterior chest wall over sternum  Respiratory:  clear to auscultation bilaterally, no wheezing, rales or rhonchi. Respiratory effort normal. No accessory muscle use.  Abdomen: soft nontender, nondistended, normal bowel sounds, no hepatosplenomegaly, no hernias  Musculoskeletal: : no cyanosis, clubbing or edema noted bilaterally                       Strength intact, ROM intact. Tone wnl.  Neuro: Cranial nerves II-XII intact, strength, sensation, reflexes Psych:  judgement and insight appear normal, stable mood and affect, mental status Skin: no rashes or lesions or ulcers, no induration or nodules   Data reviewed:  I have personally reviewed following labs and imaging studies Labs:  CBC: Recent Labs  Lab 08/31/21 1409  WBC 2.1*  HGB 13.7  HCT 40.2  MCV 98.0  PLT 845    Basic Metabolic Panel: Recent Labs  Lab 08/31/21 1409  NA 135  K 3.6  CL 98  CO2 26  GLUCOSE 102*  BUN <5*  CREATININE 0.81  CALCIUM 9.5  MG 2.5*   GFR Estimated Creatinine Clearance: 89.5 mL/min (by C-G formula based on SCr of 0.81 mg/dL). Liver Function Tests: Recent Labs  Lab 08/31/21 1409  AST 70*  ALT 63*  ALKPHOS 83  BILITOT 0.8  PROT 7.6  ALBUMIN 3.8   No  results for input(s): LIPASE, AMYLASE in the last 168 hours. No results for input(s): AMMONIA in the last 168 hours. Coagulation profile No results for input(s): INR, PROTIME in the last 168 hours.  Cardiac Enzymes: No results for input(s): CKTOTAL, CKMB, CKMBINDEX, TROPONINI in the last 168 hours. BNP: Invalid input(s): POCBNP CBG: No results for input(s): GLUCAP in the last 168 hours. D-Dimer No results for input(s): DDIMER in the last 72 hours. Hgb A1c No results for input(s): HGBA1C in the last 72 hours. Lipid Profile No results for input(s): CHOL, HDL, LDLCALC, TRIG, CHOLHDL, LDLDIRECT in the last 72 hours. Thyroid function studies No results for input(s): TSH, T4TOTAL, T3FREE, THYROIDAB in the last 72 hours.  Invalid input(s): FREET3 Anemia work up No results for input(s): VITAMINB12, FOLATE, FERRITIN, TIBC, IRON, RETICCTPCT in the last 72 hours. Urinalysis    Component Value Date/Time   COLORURINE YELLOW 10/22/2016 0830   APPEARANCEUR CLOUDY (A) 10/22/2016 0830   LABSPEC >=1.030 09/24/2017 1053   PHURINE 6.0 09/24/2017 1053   GLUCOSEU NEGATIVE 09/24/2017 1053   HGBUR NEGATIVE 09/24/2017 1053   BILIRUBINUR SMALL (A) 09/24/2017 1053   KETONESUR NEGATIVE 09/24/2017 1053    PROTEINUR 30 (A) 09/24/2017 1053   UROBILINOGEN 1.0 09/24/2017 1053   NITRITE NEGATIVE 09/24/2017 1053   LEUKOCYTESUR TRACE (A) 09/24/2017 1053     Sepsis Labs Invalid input(s): PROCALCITONIN,  WBC,  LACTICIDVEN Microbiology No results found for this or any previous visit (from the past 240 hour(s)).     Inpatient Medications:   Scheduled Meds:  furosemide  40 mg Oral Daily   heparin  5,000 Units Subcutaneous Q8H   metoprolol succinate  50 mg Oral Daily   [START ON 09/01/2021] potassium chloride  40 mEq Oral BID   sacubitril-valsartan  1 tablet Oral BID   Continuous Infusions:   Radiological Exams on Admission: ECHOCARDIOGRAM COMPLETE  Result Date: 08/31/2021    ECHOCARDIOGRAM REPORT   Patient Name:   TYARA DASSOW Date of Exam: 08/31/2021 Medical Rec #:  [id number]     Height:       63.0 in Accession #:    [id number]    Weight:       158.7 lb Date of Birth:  08-03-1981      BSA:          1.753 m Patient Age:    78 years      BP:           136/92 mmHg Patient Gender: F             HR:           61 bpm. Exam Location:  Inpatient Procedure: 2D Echo Indications:     ventricular tachycardia  History:         Patient has prior history of Echocardiogram examinations, most                  recent 12/26/2016. Cardiomyopathy, Defibrillator;                  Arrythmias:torsades de pointes.  Sonographer:     Johny Chess RDCS Referring Phys:  [id number] [facility] J PATWARDHAN Diagnosing Phys: Vernell Leep MD IMPRESSIONS  1. Left ventricular ejection fraction, by estimation, is 40 to 45%, although visually appears 35-40% The left ventricle has mildly decreased function. The left ventricle demonstrates global hypokinesis. There is moderate left ventricular hypertrophy. Left ventricular diastolic parameters were normal.  2. Right ventricular systolic function is normal. The right ventricular size  is normal.  3. The mitral valve is grossly normal. Mild mitral valve regurgitation.  4. The aortic valve  is tricuspid. Aortic valve regurgitation is not visualized.  5. Compared to previous outpatient study on 12/2019, EF is reduced from 50-55%. Hostoprically, it has been around 40-45%. FINDINGS  Left Ventricle: Left ventricular ejection fraction, by estimation, is 40 to 45%. The left ventricle has mildly decreased function. The left ventricle demonstrates global hypokinesis. The left ventricular internal cavity size was normal in size. There is  moderate left ventricular hypertrophy. Left ventricular diastolic parameters were normal. Right Ventricle: The right ventricular size is normal. No increase in right ventricular wall thickness. Right ventricular systolic function is normal. Left Atrium: Left atrial size was normal in size. Right Atrium: Right atrial size was normal in size. Pericardium: Trivial pericardial effusion is present. Mitral Valve: The mitral valve is grossly normal. Mild mitral valve regurgitation. Tricuspid Valve: The tricuspid valve is grossly normal. Tricuspid valve regurgitation is not demonstrated. Aortic Valve: The aortic valve is tricuspid. Aortic valve regurgitation is not visualized. Pulmonic Valve: The pulmonic valve was grossly normal. Pulmonic valve regurgitation is mild. Aorta: The aortic root and ascending aorta are structurally normal, with no evidence of dilitation. 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 No atrial level shunt detected by color flow Doppler.  LEFT VENTRICLE PLAX 2D LVIDd:         5.70 cm     Diastology LVIDs:         4.00 cm     LV e' medial:    7.07 cm/s LV PW:         0.90 cm     LV E/e' medial:  6.7 LV IVS:        0.80 cm     LV e' lateral:   6.64 cm/s LVOT diam:     2.20 cm     LV E/e' lateral: 7.1 LV SV:         63 LV SV Index:   36 LVOT Area:     3.80 cm  LV Volumes (MOD) LV vol d, MOD A4C: 79.4 ml LV vol s, MOD A4C: 47.1 ml LV SV MOD A4C:     79.4 ml RIGHT VENTRICLE             IVC RV S prime:     12.60 cm/s  IVC diam: 1.70 cm TAPSE (M-mode): 2.3 cm  LEFT ATRIUM             Index        RIGHT ATRIUM           Index LA diam:        4.20 cm 2.40 cm/m   RA Area:     15.00 cm LA Vol (A2C):   56.0 ml 31.95 ml/m  RA Volume:   38.50 ml  21.97 ml/m LA Vol (A4C):   38.0 ml 21.68 ml/m LA Biplane Vol: 45.9 ml 26.19 ml/m  AORTIC VALVE LVOT Vmax:   79.80 cm/s LVOT Vmean:  50.700 cm/s LVOT VTI:    0.165 m  AORTA Ao Root diam: 2.70 cm Ao Asc diam:  2.60 cm MITRAL VALVE MV Area (PHT): 2.34 cm    SHUNTS MV Decel Time: 324 msec    Systemic VTI:  0.16 m MV E velocity: 47.40 cm/s  Systemic Diam: 2.20 cm MV A velocity: 48.70 cm/s MV E/A ratio:  0.97 Manish Patwardhan MD Electronically signed by Truett Mainland MD Signature Date/Time: 08/31/2021/5:20:16 PM  Final     Impression/Recommendations Active Problems:   Ventricular tachycardia, polymorphic -per cardiology management. Shocked by ICD.  -EP consulted -echo pending. -electrolytes repleted by cardiology  Elevated d-dimer -She has no leg swelling, shortness of breath, cough, or oxygen requirement suggestive of a PE or DVT  -I have asked nurse to get in touch with [facility] to see if we can get a copy of the CTA. They should be scanning this in and confirmed CTA negative for PE.  -will check urine pregnancy even though has had BTL -echo pending, hopefully no thrombus  -w/u for leukopenia  -check dopplers     Leukopenia -no history of leukopenia in the past, but no recent labs -? If secondary to viral illness, diuretic. Covid negative at [facility]  -no recorded fevers since she has been here -can not calculate ANC, no differential -check cbc with diff and peripheral smear    Hypomagnesemia -originally 1.4  -? Renal loss from diuretic  Hx of alcohol abuse in the past, but denies any recent use -if continues despite replacement would work up outpatient for tubular dysfunction or genetic disorders     Hypokalemia -originally 2.8 -? If due to poor PO intake + diuretic+ beta adrenergic stress  from VT/shock contributing as well as hypomag.  -repleted by cardiology -repeat in AM, magnesium replaced  Transaminitis -likely secondary to GI illness -declines any recent alcohol use -downward trending  -4 years ago ALT was 67, unsure if has an elevated baseline.     Tobacco abuse Declines nicotine patch  Thank you for this consultation.  Our [facility] hospitalist team will follow the patient with you.  Dragon dictation used in completing this note.   Time Spent: 22 minutes   Orma Flaming M.D. [facility] 08/31/2021, 9:05 PM

## 2021-09-01 ENCOUNTER — Inpatient Hospital Stay (HOSPITAL_COMMUNITY): Payer: Medicare Other

## 2021-09-01 ENCOUNTER — Other Ambulatory Visit (HOSPITAL_COMMUNITY): Payer: Self-pay

## 2021-09-01 DIAGNOSIS — M79605 Pain in left leg: Secondary | ICD-10-CM

## 2021-09-01 DIAGNOSIS — E876 Hypokalemia: Secondary | ICD-10-CM

## 2021-09-01 DIAGNOSIS — R0602 Shortness of breath: Secondary | ICD-10-CM

## 2021-09-01 DIAGNOSIS — R079 Chest pain, unspecified: Secondary | ICD-10-CM

## 2021-09-01 DIAGNOSIS — M79604 Pain in right leg: Secondary | ICD-10-CM

## 2021-09-01 DIAGNOSIS — R55 Syncope and collapse: Secondary | ICD-10-CM

## 2021-09-01 LAB — HEPATIC FUNCTION PANEL
ALT: 46 U/L — ABNORMAL HIGH (ref 0–44)
AST: 49 U/L — ABNORMAL HIGH (ref 15–41)
Albumin: 3.4 g/dL — ABNORMAL LOW (ref 3.5–5.0)
Alkaline Phosphatase: 70 U/L (ref 38–126)
Bilirubin, Direct: 0.2 mg/dL (ref 0.0–0.2)
Indirect Bilirubin: 0.6 mg/dL (ref 0.3–0.9)
Total Bilirubin: 0.8 mg/dL (ref 0.3–1.2)
Total Protein: 6.8 g/dL (ref 6.5–8.1)

## 2021-09-01 LAB — URINALYSIS, ROUTINE W REFLEX MICROSCOPIC
Bilirubin Urine: NEGATIVE
Glucose, UA: NEGATIVE mg/dL
Ketones, ur: NEGATIVE mg/dL
Nitrite: POSITIVE — AB
Protein, ur: 30 mg/dL — AB
Specific Gravity, Urine: 1.026 (ref 1.005–1.030)
pH: 5 (ref 5.0–8.0)

## 2021-09-01 LAB — CBC WITH DIFFERENTIAL/PLATELET
Abs Immature Granulocytes: 0.01 10*3/uL (ref 0.00–0.07)
Basophils Absolute: 0 10*3/uL (ref 0.0–0.1)
Basophils Relative: 1 %
Eosinophils Absolute: 0 10*3/uL (ref 0.0–0.5)
Eosinophils Relative: 1 %
HCT: 37 % (ref 36.0–46.0)
Hemoglobin: 12.9 g/dL (ref 12.0–15.0)
Immature Granulocytes: 1 %
Lymphocytes Relative: 40 %
Lymphs Abs: 0.7 10*3/uL (ref 0.7–4.0)
MCH: 34.1 pg — ABNORMAL HIGH (ref 26.0–34.0)
MCHC: 34.9 g/dL (ref 30.0–36.0)
MCV: 97.9 fL (ref 80.0–100.0)
Monocytes Absolute: 0.3 10*3/uL (ref 0.1–1.0)
Monocytes Relative: 15 %
Neutro Abs: 0.7 10*3/uL — ABNORMAL LOW (ref 1.7–7.7)
Neutrophils Relative %: 42 %
Platelets: 160 10*3/uL (ref 150–400)
RBC: 3.78 MIL/uL — ABNORMAL LOW (ref 3.87–5.11)
RDW: 16.3 % — ABNORMAL HIGH (ref 11.5–15.5)
WBC: 1.7 10*3/uL — ABNORMAL LOW (ref 4.0–10.5)
nRBC: 0 % (ref 0.0–0.2)

## 2021-09-01 LAB — PREGNANCY, URINE: Preg Test, Ur: NEGATIVE

## 2021-09-01 LAB — BASIC METABOLIC PANEL
Anion gap: 8 (ref 5–15)
BUN: <5 mg/dL — ABNORMAL LOW (ref 6–20)
CO2: 25 mmol/L (ref 22–32)
Calcium: 8.9 mg/dL (ref 8.9–10.3)
Chloride: 102 mmol/L (ref 98–111)
Creatinine, Ser: 0.7 mg/dL (ref 0.44–1.00)
GFR, Estimated: >60 mL/min (ref >60)
Glucose, Bld: 113 mg/dL — ABNORMAL HIGH (ref 70–99)
Potassium: 3.9 mmol/L (ref 3.5–5.1)
Sodium: 135 mmol/L (ref 135–145)

## 2021-09-01 LAB — MAGNESIUM: Magnesium: 1.8 mg/dL (ref 1.7–2.4)

## 2021-09-01 MED ORDER — POTASSIUM CHLORIDE CRYS ER 20 MEQ PO TBCR
40.0000 meq | EXTENDED_RELEASE_TABLET | Freq: Every day | ORAL | Status: DC
  Administered 2021-09-01: 40 meq via ORAL
  Filled 2021-09-01: qty 2

## 2021-09-01 MED ORDER — FUROSEMIDE 40 MG PO TABS: 40.0000 mg | ORAL_TABLET | Freq: Every day | ORAL | 0 refills | Status: DC

## 2021-09-01 MED ORDER — POTASSIUM CHLORIDE CRYS ER 20 MEQ PO TBCR
40.0000 meq | EXTENDED_RELEASE_TABLET | Freq: Every day | ORAL | 1 refills | Status: DC
  Filled 2021-09-01: qty 60, 30d supply, fill #0

## 2021-09-01 MED ORDER — MAGNESIUM SULFATE 2 GM/50ML IV SOLN
2.0000 g | Freq: Once | INTRAVENOUS | Status: AC
  Administered 2021-09-01: 2 g via INTRAVENOUS
  Filled 2021-09-01: qty 50

## 2021-09-01 MED ORDER — MAGNESIUM OXIDE 400 MG PO TABS
200.0000 mg | ORAL_TABLET | Freq: Two times a day (BID) | ORAL | 1 refills | Status: DC
  Filled 2021-09-01: qty 60, 60d supply, fill #0

## 2021-09-01 MED ORDER — SPIRONOLACTONE 25 MG PO TABS
25.0000 mg | ORAL_TABLET | Freq: Every day | ORAL | 3 refills | Status: DC
  Filled 2021-09-01: qty 30, 30d supply, fill #0

## 2021-09-01 MED ORDER — SPIRONOLACTONE 25 MG PO TABS
25.0000 mg | ORAL_TABLET | Freq: Every day | ORAL | Status: DC
  Administered 2021-09-01: 25 mg via ORAL
  Filled 2021-09-01: qty 1

## 2021-09-01 NOTE — Progress Notes (Signed)
Patient discharged to private residence via friend's vehicle.  Escorted to exit via wheelchair by volunteer.

## 2021-09-01 NOTE — Progress Notes (Addendum)
Subjective:  Feels well.  No complaints. Able to take p.o. No shocks since hospitalization.  Objective:  Vital Signs in the last 24 hours: Temp:  [98 F (36.7 C)-98.5 F (36.9 C)] 98.2 F (36.8 C) (10/13 0759) Pulse Rate:  [60-65] 63 (10/13 0759) Resp:  [16-18] 18 (10/13 0759) BP: (123-143)/(74-96) 123/74 (10/13 0759) SpO2:  [98 %-100 %] 100 % (10/13 0759) Weight:  [70.6 kg-72 kg] 70.6 kg (10/13 0338)  Intake/Output from previous day: 10/12 0701 - 10/13 0700 In: 793.7 [P.O.:780; IV Piggyback:13.7] Out: 1000 [Urine:1000]  Physical Exam Vitals and nursing note reviewed.  Constitutional:      General: She is not in acute distress.    Appearance: She is well-developed.  HENT:     Head: Normocephalic and atraumatic.  Eyes:     Conjunctiva/sclera: Conjunctivae normal.     Pupils: Pupils are equal, round, and reactive to light.  Neck:     Vascular: No JVD.  Cardiovascular:     Rate and Rhythm: Normal rate and regular rhythm.     Pulses: Normal pulses and intact distal pulses.     Heart sounds: No murmur heard. Pulmonary:     Effort: Pulmonary effort is normal.     Breath sounds: Normal breath sounds. No wheezing or rales.  Abdominal:     General: Bowel sounds are normal.     Palpations: Abdomen is soft.     Tenderness: There is no rebound.  Musculoskeletal:        General: No tenderness. Normal range of motion.     Right lower leg: No edema.     Left lower leg: No edema.  Lymphadenopathy:     Cervical: No cervical adenopathy.  Skin:    General: Skin is warm and dry.  Neurological:     Mental Status: She is alert and oriented to person, place, and time.     Cranial Nerves: No cranial nerve deficit.     Lab Results: BMP Recent Labs    10/05/20 1112 08/31/21 1409 09/01/21 0156  NA 137 135 135  K 3.8 3.6 3.9  CL 103 98 102  CO2 _0 GLUCOSE 93 102* 113*  BUN 17 <5* <5*  CREATININE 1.01* 0.81 0.70  CALCIUM 9.2 9.5 8.9  GFRNONAA >60 >60 >60     CBC Recent Labs  Lab 09/01/21 0156  WBC 1.7*  RBC 3.78*  HGB 12.9  HCT 37.0  PLT 160  MCV 97.9  MCH 34.1*  MCHC 34.9  RDW 16.3*  LYMPHSABS 0.7  MONOABS 0.3  EOSABS 0.0  BASOSABS 0.0    HEMOGLOBIN A1C Lab Results  Component Value Date   HGBA1C 5.5 12/11/2016      Lipid Panel     Component Value Date/Time   CHOL 173 01/21/2021 1027   TRIG 78 01/21/2021 1027   HDL 63 01/21/2021 1027   CHOLHDL 2.7 01/21/2021 1027   CHOLHDL 3.5 12/11/2016 1117   VLDL 10 12/11/2016 1117   LDLCALC 95 01/21/2021 1027     Hepatic Function Panel Recent Labs    10/05/20 1112 08/31/21 1409  PROT 8.6* 7.6  ALBUMIN 4.7 3.8  AST 16 70*  ALT 13 63*  ALKPHOS 73 83  BILITOT 0.3 0.8   Cardiac studies:  Telemetry 10/12-10/13: Nonsustained polymorphic VT up to 9 beats.  Frequent PVCs.  No sustained VT.  EKG 08/31/2021: Sinus rhythm 63 bpm.  Prolonged QTc interval at 546 ms   Echocardiogram 12/31/2020:  Normal LV systolic  function with visual EF 50-55%. Left ventricle cavity  is normal in size. Normal global wall motion. Normal diastolic filling  pattern, normal LAP.  Left atrial cavity is mildly dilated.  Mild to moderate mitral regurgitation.  Mild tricuspid regurgitation. No evidence of pulmonary hypertension.  Mild to moderate pulmonic regurgitation.  Compared to prior study dated 06/12/2019: LVEF was 45-50% now 50-55%, Mild  TR is new, no PHTN.      Assessment/Plan   40 year old African American female with nonischemic cardiomyopathy status post cardiac arrest in December 2017, ICD placement for secondary prevention, h/o BRCA 1 positive breast cancer, now admitted with syncope, polymorphic ventricular tachycardia in the setting of severe hypokalemia and hypomagnesemia.   Polymorphic ventricular tachycardia: Appropriately shocked by ICD. Etiology hypokalemia and hypomagnesemia, likely secondary to poor p.o. intake. Replace accordingly to K4, Mg 2. BNP mildly  elevated.  Echocardiogram 08/31/2021 with EF around 40%, lower than 02/2021.  However, this is about her historical baseline. Will obtain outpatient cardiac MRI. Appreciate EP consult. Change Lasix 40 mg daily to spironolactone 25 mg daily.   Elevated D-dimer: Negative for COVID.  CTA negative for PE.  (performed at [facility]) Not hypoxic. Hemodynamically stable.   Leukopenia, fever: Appreciate medicine service input.     Nigel Mormon, MD Pager: [phone] Office: [phone]

## 2021-09-01 NOTE — Plan of Care (Signed)
  Problem: Education: Goal: Knowledge of General Education information will improve Description: Including pain rating scale, medication(s)/side effects and non-pharmacologic comfort measures Outcome: Adequate for Discharge   Problem: Health Behavior/Discharge Planning: Goal: Ability to manage health-related needs will improve Outcome: Adequate for Discharge   Problem: Clinical Measurements: Goal: Ability to maintain clinical measurements within normal limits will improve Outcome: Adequate for Discharge Goal: Will remain free from infection Outcome: Adequate for Discharge Goal: Diagnostic test results will improve Outcome: Adequate for Discharge Goal: Respiratory complications will improve Outcome: Adequate for Discharge Goal: Cardiovascular complication will be avoided Outcome: Adequate for Discharge   Problem: Activity: Goal: Risk for activity intolerance will decrease Outcome: Adequate for Discharge   Problem: Nutrition: Goal: Adequate nutrition will be maintained Outcome: Adequate for Discharge   Problem: Coping: Goal: Level of anxiety will decrease Outcome: Adequate for Discharge   Problem: Elimination: Goal: Will not experience complications related to bowel motility Outcome: Adequate for Discharge Goal: Will not experience complications related to urinary retention Outcome: Adequate for Discharge   Problem: Pain Managment: Goal: General experience of comfort will improve Outcome: Adequate for Discharge   Problem: Safety: Goal: Ability to remain free from injury will improve Outcome: Adequate for Discharge   Problem: Skin Integrity: Goal: Risk for impaired skin integrity will decrease Outcome: Adequate for Discharge   Discharge instructions reviewed with patient.  These included, but were not limited to, the following:  discharge medications, pain management, when to call the MD, follow-up appointments, etc.

## 2021-09-01 NOTE — Progress Notes (Addendum)
PROGRESS NOTE    Ky Wessells  MRN:[id number] DOB: 01/25/1981 DOA: 08/31/2021 PCP: Patient, No Pcp Per (Inactive)   Brief Narrative:  Venezia Bradeen is an 40 y.o. female with history of nonischemic cardiomyopathy status post cardiac arrest in December 2017, ICD placement for secondary prevention, h/o BRCA 1 positive breast cancer tobacco abuse that we have been asked to consult on for recent illness with leukopenia and electrolyte abnormalities in setting of ventricular tachycardia by Dr. Patwardhan with cardiology. She was transferred from [facility]. Found to have abnormal labs of unclear specificity/significance including leukopenia, abnormal liver enzymes and electrolyte abnormalities.  Labs appear to be stabilizing, liver panel improving, leukopenia appears to be chronic given distant records some 4 years ago, at this time stable for discharge from medicine standpoint.  Recommend repeat labs with outpatient PCP in the next 3 to 5 days.  Assessment & Plan:   Leukopenia -Further chart reveals history of leukopenia as far back as 4 years ago, patient does not have consistent records in her system, she does not follow with PCP which we discussed she would need for follow-up labs in the next 1 to 2 weeks -Viral panel negative thus far, no signs or symptoms of infection, appears to be somewhat chronic in nature, no further work-up warranted at this time -Differential and smear unremarkable  Hypomagnesemia -Likely in the setting of poor p.o. intake, patient does continue to drink alcohol on occasion -Recommend multivitamin, increased appropriate p.o. intake  Hypokalemia -Again secondary to poor p.o. intake, continue to advance diet as tolerated -currently within normal limits   Elevated LFTs -After further discussion at bedside patient does report drinking alcohol most days of the week but not every day and only a glass of wine or 2, this could certainly explain her minimally  elevated LFTs that are now downtrending appropriately, no further work-up at this time  Elevated d-dimer -Without symptoms of chest pain shortness of breath or hypoxia -CT at previous facility noted to be negative   Ventricular tachycardia, polymorphic -per cardiology management.  Tobacco abuse Declines nicotine patch Discussed tobacco cessation at length at bedside given patient's age and risk factors as above  Subjective: No acute issues or events overnight denies nausea vomiting diarrhea constipation headache fevers chills or chest pain  Objective: Vitals:   08/31/21 1629 08/31/21 1941 09/01/21 0031 09/01/21 0338  BP: (!) 143/96 139/88 (!) 141/92 133/87  Pulse: 63 62 60 60  Resp: 16 18 18 18  Temp: 98.3 F (36.8 C) 98 F (36.7 C) 98.3 F (36.8 C) 98.1 F (36.7 C)  TempSrc: Oral Oral Oral Oral  SpO2: 100% 100% 100% 98%  Weight:    70.6 kg  Height:        Intake/Output Summary (Last 24 hours) at 09/01/2021 0746 Last data filed at 09/01/2021 0602 Gross per 24 hour  Intake 793.67 ml  Output 1000 ml  Net -206.33 ml   Filed Weights   08/31/21 1333 09/01/21 0338  Weight: 72 kg 70.6 kg    Examination:  General exam: Appears calm and comfortable  Respiratory system: Clear to auscultation. Respiratory effort normal. Cardiovascular system: S1 & S2 heard, RRR. No JVD, murmurs, rubs, gallops or clicks. No pedal edema. Gastrointestinal system: Abdomen is nondistended, soft and nontender. No organomegaly or masses felt. Normal bowel sounds heard. Central nervous system: Alert and oriented. No focal neurological deficits. Extremities: Symmetric 5 x 5 power. Skin: No rashes, lesions or ulcers Psychiatry: Judgement and insight appear normal. Mood & affect   appropriate.     Data Reviewed: I have personally reviewed following labs and imaging studies  CBC: Recent Labs  Lab 08/31/21 1409 08/31/21 2234 09/01/21 0156  WBC 2.1* 1.9* 1.7*  NEUTROABS  --  1.0* 0.7*  HGB  13.7 13.0 12.9  HCT 40.2 37.2 37.0  MCV 98.0 97.9 97.9  PLT 188 162 160   Basic Metabolic Panel: Recent Labs  Lab 08/31/21 1409 09/01/21 0156  NA 135 135  K 3.6 3.9  CL 98 102  CO2 26 25  GLUCOSE 102* 113*  BUN <5* <5*  CREATININE 0.81 0.70  CALCIUM 9.5 8.9  MG 2.5* 1.8   GFR: Estimated Creatinine Clearance: 89.9 mL/min (by C-G formula based on SCr of 0.7 mg/dL). Liver Function Tests: Recent Labs  Lab 08/31/21 1409  AST 70*  ALT 63*  ALKPHOS 83  BILITOT 0.8  PROT 7.6  ALBUMIN 3.8   No results for input(s): LIPASE, AMYLASE in the last 168 hours. No results for input(s): AMMONIA in the last 168 hours. Coagulation Profile: No results for input(s): INR, PROTIME in the last 168 hours. Cardiac Enzymes: No results for input(s): CKTOTAL, CKMB, CKMBINDEX, TROPONINI in the last 168 hours. BNP (last 3 results) No results for input(s): PROBNP in the last 8760 hours. HbA1C: No results for input(s): HGBA1C in the last 72 hours. CBG: No results for input(s): GLUCAP in the last 168 hours. Lipid Profile: No results for input(s): CHOL, HDL, LDLCALC, TRIG, CHOLHDL, LDLDIRECT in the last 72 hours. Thyroid Function Tests: No results for input(s): TSH, T4TOTAL, FREET4, T3FREE, THYROIDAB in the last 72 hours. Anemia Panel: No results for input(s): VITAMINB12, FOLATE, FERRITIN, TIBC, IRON, RETICCTPCT in the last 72 hours. Sepsis Labs: No results for input(s): PROCALCITON, LATICACIDVEN in the last 168 hours.  No results found for this or any previous visit (from the past 240 hour(s)).       Radiology Studies: ECHOCARDIOGRAM COMPLETE  Result Date: 08/31/2021    ECHOCARDIOGRAM REPORT   Patient Name:   Addalyne Pucillo Date of Exam: 08/31/2021 Medical Rec #:  [id number]     Height:       63.0 in Accession #:    [id number]    Weight:       158.7 lb Date of Birth:  12/12/1980      BSA:          1.753 m Patient Age:    40 years      BP:           136/92 mmHg Patient Gender: F              HR:           61 bpm. Exam Location:  Inpatient Procedure: 2D Echo Indications:     ventricular tachycardia  History:         Patient has prior history of Echocardiogram examinations, most                  recent 12/26/2016. Cardiomyopathy, Defibrillator;                  Arrythmias:torsades de pointes.  Sonographer:     Lauren Pennington RDCS Referring Phys:  [id number] MANISH J PATWARDHAN Diagnosing Phys: Manish Patwardhan MD IMPRESSIONS  1. Left ventricular ejection fraction, by estimation, is 40 to 45%, although visually appears 35-40% The left ventricle has mildly decreased function. The left ventricle demonstrates global hypokinesis. There is moderate left ventricular hypertrophy. Left ventricular diastolic parameters were normal.    2. Right ventricular systolic function is normal. The right ventricular size is normal.  3. The mitral valve is grossly normal. Mild mitral valve regurgitation.  4. The aortic valve is tricuspid. Aortic valve regurgitation is not visualized.  5. Compared to previous outpatient study on 12/2019, EF is reduced from 50-55%. Hostoprically, it has been around 40-45%. FINDINGS  Left Ventricle: Left ventricular ejection fraction, by estimation, is 40 to 45%. The left ventricle has mildly decreased function. The left ventricle demonstrates global hypokinesis. The left ventricular internal cavity size was normal in size. There is  moderate left ventricular hypertrophy. Left ventricular diastolic parameters were normal. Right Ventricle: The right ventricular size is normal. No increase in right ventricular wall thickness. Right ventricular systolic function is normal. Left Atrium: Left atrial size was normal in size. Right Atrium: Right atrial size was normal in size. Pericardium: Trivial pericardial effusion is present. Mitral Valve: The mitral valve is grossly normal. Mild mitral valve regurgitation. Tricuspid Valve: The tricuspid valve is grossly normal. Tricuspid valve regurgitation is not  demonstrated. Aortic Valve: The aortic valve is tricuspid. Aortic valve regurgitation is not visualized. Pulmonic Valve: The pulmonic valve was grossly normal. Pulmonic valve regurgitation is mild. Aorta: The aortic root and ascending aorta are structurally normal, with no evidence of dilitation. 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 No atrial level shunt detected by color flow Doppler.  LEFT VENTRICLE PLAX 2D LVIDd:         5.70 cm     Diastology LVIDs:         4.00 cm     LV e' medial:    7.07 cm/s LV PW:         0.90 cm     LV E/e' medial:  6.7 LV IVS:        0.80 cm     LV e' lateral:   6.64 cm/s LVOT diam:     2.20 cm     LV E/e' lateral: 7.1 LV SV:         63 LV SV Index:   36 LVOT Area:     3.80 cm  LV Volumes (MOD) LV vol d, MOD A4C: 79.4 ml LV vol s, MOD A4C: 47.1 ml LV SV MOD A4C:     79.4 ml RIGHT VENTRICLE             IVC RV S prime:     12.60 cm/s  IVC diam: 1.70 cm TAPSE (M-mode): 2.3 cm LEFT ATRIUM             Index        RIGHT ATRIUM           Index LA diam:        4.20 cm 2.40 cm/m   RA Area:     15.00 cm LA Vol (A2C):   56.0 ml 31.95 ml/m  RA Volume:   38.50 ml  21.97 ml/m LA Vol (A4C):   38.0 ml 21.68 ml/m LA Biplane Vol: 45.9 ml 26.19 ml/m  AORTIC VALVE LVOT Vmax:   79.80 cm/s LVOT Vmean:  50.700 cm/s LVOT VTI:    0.165 m  AORTA Ao Root diam: 2.70 cm Ao Asc diam:  2.60 cm MITRAL VALVE MV Area (PHT): 2.34 cm    SHUNTS MV Decel Time: 324 msec    Systemic VTI:  0.16 m MV E velocity: 47.40 cm/s  Systemic Diam: 2.20 cm MV A velocity: 48.70 cm/s MV E/A ratio:  0.97 Manish Patwardhan MD Electronically  signed by Manish Patwardhan MD Signature Date/Time: 08/31/2021/5:20:16 PM    Final     Scheduled Meds:  furosemide  40 mg Oral Daily   heparin  5,000 Units Subcutaneous Q8H   metoprolol succinate  50 mg Oral Daily   potassium chloride  40 mEq Oral BID   sacubitril-valsartan  1 tablet Oral BID   Continuous Infusions:   LOS: 1 day   Time spent: 30min  William C Lancaster,  DO [facility]  If 7PM-7AM, please contact night-coverage [email or web address]  09/01/2021, 7:46 AM      

## 2021-09-01 NOTE — Progress Notes (Signed)
VASCULAR LAB    Bilateral lower extremity venous duplex has been performed.  See CV proc for preliminary results.   Mackenzi Krogh, RVT 09/01/2021, 1:54 PM

## 2021-09-01 NOTE — Discharge Summary (Addendum)
Physician Discharge Summary  Patient ID: Rachel Duncan MRN: [id number] DOB/AGE: [birth year] 40 y.o.  Admit date: 08/31/2021 Discharge date: 09/01/2021  Primary Discharge Diagnosis: Polymorphic ventricular tachycardia Hypomagnesemia Hypokalemia Nonischemic cardiomyopathy S/p ICD  Secondary Discharge Diagnosis: Elevated D-dimer Transaminitis   HPI 08/31/2021: 40 year old African American female with nonischemic cardiomyopathy status post cardiac arrest in December 2017, ICD placement for secondary prevention, h/o BRCA 1 positive breast cancer, now admitted with syncope, polymorphic ventricular tachycardia   Admission information provided to me prior to her transfer suggested that patient did not get shocked for these polymorphic ventricular tachycardia episodes.  However, patient tells me that she had been shocked at least couple times, both at home and during hospital stay around the hospital.  This was confirmed by deciliter rotation.  She reports that she has been feeling sick and unwell for last several days.  She had at least 1 episode of fever 2 days ago.  She has not been able to eat anything due to that.  By the time cough, her potassium and magnesium were low at 2.8 and 1.4 respectively.  Per records, she received 40 mill equivalents p.o. and 10 mEq IV of potassium, and 4 g of magnesium.  Patient currently denies any symptoms.  Further review of the records shows elevated D-dimer of 2000.  It appears that patient underwent CT angiogram chest at [facility], results not available to me at this time.  Of note, she was tested negative for COVID.  She denies any chest pain or shortness of breath symptoms at this time.   08/31/2021: Glucose 137, BUN/Cr 5/0.7. EGFR >60. Na/K 132/2.8. Mg 1.4. AST/ALT 100/60. Rest of the CMP normal. WBC count 2.2K H/H 13/38. MCV 98. Platelets 162 Trop I <0.01. BNP 160 (Normal) D-dimer 2002 High  Hospital Course:   Patient's hospital course was  unremarkable.  She did not have any significant signs symptoms of fever.  CT was negative for PA, performed at [facility].  Patient had PVCs and nonsustained ventricular tachycardia up to 9 beats, but no sustained ventricular tachycardia episodes.  EP and general medicine consults were obtained.  There was no acute etiology suspected.  Transaminitis could be related to her use of alcohol at baseline.  She has had leukopenia in the past also, without any ongoing signs symptoms of infection.  Magnesium and potassium were appropriately replaced.  Echocardiogram showed EF of around 40%.  She was started on spironolactone 25 mg daily.  Outpatient follow-up was arranged.  Will obtain outpatient cardiac MRI.  Outpatient primary care follow-up was arranged.  Discharge Exam: Blood pressure 137/87, pulse (!) 59, temperature 98.3 F (36.8 C), temperature source Oral, resp. rate 18, height $RemoveBe'5\' 3"'[id number]$  (1.6 m), weight 70.6 kg, SpO2 99 %, unknown if currently breastfeeding.   Physical Exam Vitals and nursing note reviewed.  Constitutional:      General: She is not in acute distress.    Appearance: She is well-developed.  HENT:     Head: Normocephalic and atraumatic.  Eyes:     Conjunctiva/sclera: Conjunctivae normal.     Pupils: Pupils are equal, round, and reactive to light.  Neck:     Vascular: No JVD.  Cardiovascular:     Rate and Rhythm: Normal rate and regular rhythm.     Pulses: Normal pulses and intact distal pulses.     Heart sounds: No murmur heard. Pulmonary:     Effort: Pulmonary effort is normal.     Breath sounds: Normal breath sounds. No wheezing  or rales.  Abdominal:     General: Bowel sounds are normal.     Palpations: Abdomen is soft.     Tenderness: There is no rebound.  Musculoskeletal:        General: No tenderness. Normal range of motion.     Right lower leg: No edema.     Left lower leg: No edema.  Lymphadenopathy:     Cervical: No cervical adenopathy.  Skin:    General:  Skin is warm and dry.  Neurological:     Mental Status: She is alert and oriented to person, place, and time.     Cranial Nerves: No cranial nerve deficit.    Significant Diagnostic Studies:  LE DVT US 09/01/2021: BILATERAL:  - No evidence of deep vein thrombosis seen in the lower extremities,  bilaterally.   Echocardiogram 08/31/2021:  1. Left ventricular ejection fraction, by estimation, is 40 to 45%,  although visually appears 35-40% The left ventricle has mildly decreased  function. The left ventricle demonstrates global hypokinesis. There is  moderate left ventricular hypertrophy.  Left ventricular diastolic parameters were normal.   2. Right ventricular systolic function is normal. The right ventricular  size is normal.   3. The mitral valve is grossly normal. Mild mitral valve regurgitation.   4. The aortic valve is tricuspid. Aortic valve regurgitation is not  visualized.   5. Compared to previous outpatient study on 12/2019, EF is reduced from  50-55%. Hostoprically, it has been around 40-45%.   EKG 08/31/2021: Sinus rhythm 63 bpm.  Prolonged QTc interval at 546 ms    Labs:   Lab Results  Component Value Date   WBC 1.7 (L) 09/01/2021   HGB 12.9 09/01/2021   HCT 37.0 09/01/2021   MCV 97.9 09/01/2021   PLT 160 09/01/2021    Recent Labs  Lab 09/01/21 0156  NA 135  K 3.9  CL 102  CO2 25  BUN <5*  CREATININE 0.70  CALCIUM 8.9  PROT 6.8  BILITOT 0.8  ALKPHOS 70  ALT 46*  AST 49*  GLUCOSE 113*    Lipid Panel     Component Value Date/Time   CHOL 173 01/21/2021 1027   TRIG 78 01/21/2021 1027   HDL 63 01/21/2021 1027   CHOLHDL 2.7 01/21/2021 1027   CHOLHDL 3.5 12/11/2016 1117   VLDL 10 12/11/2016 1117   LDLCALC 95 01/21/2021 1027    BNP (last 3 results) Recent Labs    08/31/21 1409  BNP 192.8*    HEMOGLOBIN A1C Lab Results  Component Value Date   HGBA1C 5.5 12/11/2016    Cardiac Panel (last 3 results) No results for input(s):  CKTOTAL, CKMB, TROPONINI, RELINDX in the last 8760 hours.  Lab Results  Component Value Date   CKTOTAL 3,820 (H) 10/22/2016   CKMB 81.5 (H) 10/22/2016   TROPONINI 0.68 (HH) 10/22/2016     TSH No results for input(s): TSH in the last 8760 hours.  Radiology: ECHOCARDIOGRAM COMPLETE  Result Date: 08/31/2021    ECHOCARDIOGRAM REPORT   Patient Name:   Rachel Duncan Date of Exam: 08/31/2021 Medical Rec #:  [id number]     Height:       63.0 in Accession #:    [id number]    Weight:       158.7 lb Date of Birth:  [birth year]      BSA:          1.753 m Patient Age:    25 years  BP:           136/92 mmHg Patient Gender: F             HR:           61 bpm. Exam Location:  Inpatient Procedure: 2D Echo Indications:     ventricular tachycardia  History:         Patient has prior history of Echocardiogram examinations, most                  recent 12/26/2016. Cardiomyopathy, Defibrillator;                  Arrythmias:torsades de pointes.  Sonographer:     Johny Chess RDCS Referring Phys:  [id number] [facility] J Jaana Brodt Diagnosing Phys: Vernell Leep MD IMPRESSIONS  1. Left ventricular ejection fraction, by estimation, is 40 to 45%, although visually appears 35-40% The left ventricle has mildly decreased function. The left ventricle demonstrates global hypokinesis. There is moderate left ventricular hypertrophy. Left ventricular diastolic parameters were normal.  2. Right ventricular systolic function is normal. The right ventricular size is normal.  3. The mitral valve is grossly normal. Mild mitral valve regurgitation.  4. The aortic valve is tricuspid. Aortic valve regurgitation is not visualized.  5. Compared to previous outpatient study on 12/2019, EF is reduced from 50-55%. Hostoprically, it has been around 40-45%. FINDINGS  Left Ventricle: Left ventricular ejection fraction, by estimation, is 40 to 45%. The left ventricle has mildly decreased function. The left ventricle demonstrates global hypokinesis.  The left ventricular internal cavity size was normal in size. There is  moderate left ventricular hypertrophy. Left ventricular diastolic parameters were normal. Right Ventricle: The right ventricular size is normal. No increase in right ventricular wall thickness. Right ventricular systolic function is normal. Left Atrium: Left atrial size was normal in size. Right Atrium: Right atrial size was normal in size. Pericardium: Trivial pericardial effusion is present. Mitral Valve: The mitral valve is grossly normal. Mild mitral valve regurgitation. Tricuspid Valve: The tricuspid valve is grossly normal. Tricuspid valve regurgitation is not demonstrated. Aortic Valve: The aortic valve is tricuspid. Aortic valve regurgitation is not visualized. Pulmonic Valve: The pulmonic valve was grossly normal. Pulmonic valve regurgitation is mild. Aorta: The aortic root and ascending aorta are structurally normal, with no evidence of dilitation. 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 No atrial level shunt detected by color flow Doppler.  LEFT VENTRICLE PLAX 2D LVIDd:         5.70 cm     Diastology LVIDs:         4.00 cm     LV e' medial:    7.07 cm/s LV PW:         0.90 cm     LV E/e' medial:  6.7 LV IVS:        0.80 cm     LV e' lateral:   6.64 cm/s LVOT diam:     2.20 cm     LV E/e' lateral: 7.1 LV SV:         63 LV SV Index:   36 LVOT Area:     3.80 cm  LV Volumes (MOD) LV vol d, MOD A4C: 79.4 ml LV vol s, MOD A4C: 47.1 ml LV SV MOD A4C:     79.4 ml RIGHT VENTRICLE             IVC RV S prime:     12.60 cm/s  IVC diam: 1.70 cm TAPSE (  M-mode): 2.3 cm LEFT ATRIUM             Index        RIGHT ATRIUM           Index LA diam:        4.20 cm 2.40 cm/m   RA Area:     15.00 cm LA Vol (A2C):   56.0 ml 31.95 ml/m  RA Volume:   38.50 ml  21.97 ml/m LA Vol (A4C):   38.0 ml 21.68 ml/m LA Biplane Vol: 45.9 ml 26.19 ml/m  AORTIC VALVE LVOT Vmax:   79.80 cm/s LVOT Vmean:  50.700 cm/s LVOT VTI:    0.165 m  AORTA Ao Root diam:  2.70 cm Ao Asc diam:  2.60 cm MITRAL VALVE MV Area (PHT): 2.34 cm    SHUNTS MV Decel Time: 324 msec    Systemic VTI:  0.16 m MV E velocity: 47.40 cm/s  Systemic Diam: 2.20 cm MV A velocity: 48.70 cm/s MV E/A ratio:  0.97 Havilah Topor MD Electronically signed by Vernell Leep MD Signature Date/Time: 08/31/2021/5:20:16 PM    Final    VAS [place] LOWER EXTREMITY VENOUS (DVT)  Result Date: 09/01/2021  Lower Venous DVT Study Patient Name:  Rachel Duncan  Date of Exam:   09/01/2021 Medical Rec #: [id number]      Accession #:    [id number] Date of Birth: 1981-07-29       Patient Gender: F Patient Age:   89 years Exam Location:  [facility] Procedure:      VAS [place] LOWER EXTREMITY VENOUS (DVT) Referring Phys: Orma Flaming --------------------------------------------------------------------------------  Indications: Pain, SOB, and Chest pain, syncope.  Risk Factors: Cancer BRCA 1 positive breast cancer nonischemic cardiomyopathy status post cardiac arrest in December 2017, ICD. Comparison Study: No prior study on file Performing Technologist: Sharion Dove RVS  Examination Guidelines: A complete evaluation includes B-mode imaging, spectral Doppler, color Doppler, and power Doppler as needed of all accessible portions of each vessel. Bilateral testing is considered an integral part of a complete examination. Limited examinations for reoccurring indications may be performed as noted. The reflux portion of the exam is performed with the patient in reverse Trendelenburg.  +---------+---------------+---------+-----------+----------+--------------+ RIGHT    CompressibilityPhasicitySpontaneityPropertiesThrombus Aging +---------+---------------+---------+-----------+----------+--------------+ CFV      Full           Yes      Yes                                 +---------+---------------+---------+-----------+----------+--------------+ SFJ      Full                                                         +---------+---------------+---------+-----------+----------+--------------+ FV Prox  Full                                                        +---------+---------------+---------+-----------+----------+--------------+ FV Mid   Full                                                        +---------+---------------+---------+-----------+----------+--------------+  FV DistalFull                                                        +---------+---------------+---------+-----------+----------+--------------+ PFV      Full                                                        +---------+---------------+---------+-----------+----------+--------------+ POP      Full           Yes      Yes                                 +---------+---------------+---------+-----------+----------+--------------+ PTV      Full                                                        +---------+---------------+---------+-----------+----------+--------------+ PERO     Full                                                        +---------+---------------+---------+-----------+----------+--------------+   +---------+---------------+---------+-----------+----------+--------------+ LEFT     CompressibilityPhasicitySpontaneityPropertiesThrombus Aging +---------+---------------+---------+-----------+----------+--------------+ CFV      Full           Yes      Yes                                 +---------+---------------+---------+-----------+----------+--------------+ SFJ      Full                                                        +---------+---------------+---------+-----------+----------+--------------+ FV Prox  Full                                                        +---------+---------------+---------+-----------+----------+--------------+ FV Mid   Full                                                         +---------+---------------+---------+-----------+----------+--------------+ FV DistalFull                                                        +---------+---------------+---------+-----------+----------+--------------+  PFV      Full                                                        +---------+---------------+---------+-----------+----------+--------------+ POP      Full           Yes      Yes                                 +---------+---------------+---------+-----------+----------+--------------+ PTV      Full                                                        +---------+---------------+---------+-----------+----------+--------------+ PERO     Full                                                        +---------+---------------+---------+-----------+----------+--------------+     Summary: BILATERAL: - No evidence of deep vein thrombosis seen in the lower extremities, bilaterally. -   *See table(s) above for measurements and observations. Electronically signed by Orlie Pollen on 09/01/2021 at 2:56:55 PM.    Final       FOLLOW UP PLANS AND APPOINTMENTS Discharge Instructions     Diet - low sodium heart healthy   Complete by: As directed    Increase activity slowly   Complete by: As directed       Allergies as of 09/01/2021   No Known Allergies      Medication List     TAKE these medications    Entresto 97-103 MG Generic drug: sacubitril-valsartan TAKE 1 TABLET BY MOUTH TWICE DAILY   furosemide 40 MG tablet Commonly known as: LASIX Take 1 tablet (40 mg total) by mouth daily. What changed: See the new instructions.   magnesium oxide 400 MG tablet Commonly known as: MAG-OX Take 1/2 tablet (200 mg total) by mouth in the morning and at bedtime.   metoprolol succinate 50 MG 24 hr tablet Commonly known as: TOPROL-XL TAKE 1 TABLET BY MOUTH EVERY DAY WITH OR IMMEDIATELY FOLLOWING A MEAL What changed: See the new instructions.    potassium chloride SA 20 MEQ tablet Commonly known as: KLOR-CON Take 2 tablets (40 mEq total) by mouth daily. Start taking on: September 02, 2021   spironolactone 25 MG tablet Commonly known as: ALDACTONE Take 1 tablet (25 mg total) by mouth daily. Start taking on: September 02, 2021        Follow-up Information     Rayetta Pigg, [facility] [place], [place] Follow up on 09/08/2021.   Specialty: Cardiology Why: 11 AM Contact information: [place] [address] [address]. Go on 09/06/2021.   Why: $Rem'@2'DNLe$ :10pm Contact information: [place] [address] [phone]                  [place]  Virgina Jock, MD Pager: [phone] Office: [phone]

## 2021-09-02 ENCOUNTER — Telehealth: Payer: Self-pay

## 2021-09-02 LAB — PATHOLOGIST SMEAR REVIEW

## 2021-09-02 NOTE — Telephone Encounter (Signed)
Called and spoke with patient mother Rachel Duncan. Rachel Duncan was not home, but her mother will contact her and have her call us back to do her TOC.

## 2021-09-05 NOTE — Telephone Encounter (Signed)
Patient called back.  Location of hospitalization: [facility] Reason for hospitalization: syncope Date of discharge: 09/01/2021  Date of first communication with patient: message was left with mother on Thursday, patient finally called back today. Person contacting patient: Gaye Alken, CMA Current symptoms: Do you understand why you were in the Hospital: Yes Questions regarding discharge instructions: None Where were you discharged to: Home Medications reviewed: Yes Allergies reviewed: Yes Dietary changes reviewed: Yes. Discussed low fat and low salt diet.  Referals reviewed: NA Activities of Daily Living: Able to with mild limitations Any transportation issues/concerns: None Any patient concerns: None Confirmed importance & date/time of Follow up appt: Yes Confirmed with patient if condition begins to worsen call. Pt was given the office number and encouraged to call back with questions or concerns: Yes

## 2021-09-06 ENCOUNTER — Inpatient Hospital Stay: Payer: Medicaid Other | Admitting: Physician Assistant

## 2021-09-07 ENCOUNTER — Ambulatory Visit: Payer: Medicaid Other | Admitting: Nurse Practitioner

## 2021-09-08 ENCOUNTER — Telehealth: Payer: Self-pay | Admitting: Oncology

## 2021-09-08 ENCOUNTER — Ambulatory Visit: Payer: Medicaid Other | Admitting: Student

## 2021-09-08 NOTE — Telephone Encounter (Signed)
Patient requesting to be Re-Est - She rescheduled her Missed Feb Appt's to 10/21 Labs 1:00 pm - Follow Up 1:30 pm

## 2021-09-08 NOTE — Progress Notes (Signed)
[facility]  [address] [place],  [place]  [address] [phone]  Clinic Day:  09/09/2021  Referring physician: No ref. provider found  This document serves as a record of services personally performed by Hosie Poisson, MD. It was created on their behalf by Curry,Lauren E, a trained medical scribe. The creation of this record is based on the scribe's personal observations and the provider's statements to them.  CHIEF COMPLAINT:  CC: History of stage IIIB triple negative invasive ductal carcinoma  Current Treatment:  Surveillance   HISTORY OF PRESENT ILLNESS:  Rachel Duncan is a 40 y.o. female who presented to our clinic in 2021 for a transfer of care with a history of stage IIIB (T2N1M0), triple negative invasive ductal carcinoma, diagnosed in February 2020.   She is a carrier for BRCA1.  This was found when the patient noticed a mass of the right breast, which was believed to be a cyst.  Mammography revealed a roughly 2.5 cm lesion with associated calcifications, as well as two additional smaller masses.  Biopsy was consistent with invasive ductal carcinoma, grade 3, triple negative.  Axillary lymphadenopathy was also seen.  Original CT imaging revealed a lucent lesion of the sternum, but subsequent bone scan was negative.  She received neoadjuvant chemotherapy with 6 cycles of TC in [facility], then subsequently underwent bilateral mastectomies and right sentinel lymph node biopsy with tissue expander reconstruction.   Pathology from her mastectomy revealed no residual carcinoma involving the right breast, for a CR.  Lymph nodes were negative.  She received adjuvant radiation therapy here at the [facility] with Dr. Orlene Erm and completed a 50.4 Gy course in October 2020.  She started menarche at age 44 and has had no further menstrual periods since her chemotherapyl.  She had her first child at the age of 78.  She has previously used oral  contraceptives, and has never been on hormone replacement therapy.    INTERVAL HISTORY:  Rachel Duncan comes in today after not being seen since November 2021. She presented to [facility] emergency department on October 12th due to syncope and arrhythmias. She was transferred to [facility] for evaluation and work up by cardiology. ECHO revealed an EF by estimation of 40-45%, but visually appears to be 35-40% with global hypokinesis. She was started on Lasix 40 mg daily spironolactone 25 mg daily. She is scheduled for follow up with cardiology on October 25th with outpatient cardiac MRI. She states that she is doing better. Her palpitations and lower extremity edema have resolved. She still has not undergone hysterectomy as this was to be done at that same time of her breast surgery. She still needs the spacers removed and replaced with the permanent implants. She did have leukopenia during that admission, which has now resolved.  Blood counts and chemistries are unremarkable except for a BUN of 26, a creatinine of 1.4, mildly elevated liver transaminases. Total protein is elevated at 9.5 but albumin is also elevated at 5.3. I advised that she hold both diuretics for now, until she follows up with cardiology on Tuesday, 09/20/21. Her  appetite is up and down, and she has lost 31 pounds since her last visit.  She denies fever, chills or other signs of infection.  She denies nausea, vomiting, bowel issues, or abdominal pain.  She denies sore throat, cough, dyspnea, or chest pain.  REVIEW OF SYSTEMS:  Review of Systems  Constitutional:  Positive for appetite change (comes and goes). Negative for chills,  fatigue, fever and unexpected weight change.  HENT:  Negative.    Eyes: Negative.   Respiratory: Negative.  Negative for chest tightness, cough, hemoptysis, shortness of breath and wheezing.   Cardiovascular:  Positive for palpitations (during her hospital stay). Negative for chest pain and leg swelling.  Gastrointestinal:  Negative.  Negative for abdominal distention, abdominal pain, blood in stool, constipation, diarrhea, nausea and vomiting.  Endocrine: Negative.   Genitourinary: Negative.  Negative for difficulty urinating, dysuria, frequency and hematuria.   Musculoskeletal: Negative.  Negative for arthralgias, back pain, flank pain, gait problem and myalgias.  Skin: Negative.   Neurological: Negative.  Negative for dizziness, extremity weakness, gait problem, headaches, light-headedness, numbness, seizures and speech difficulty.  Hematological: Negative.   Psychiatric/Behavioral: Negative.  Negative for depression and sleep disturbance. The patient is not nervous/anxious.   All other systems reviewed and are negative.   VITALS:  Blood pressure 98/68, pulse 69, temperature 98.5 F (36.9 C), temperature source Oral, resp. rate 18, height _0  (1.6 m), weight 153 lb 14.4 oz (69.8 kg), SpO2 98 %, unknown if currently breastfeeding.  Wt Readings from Last 3 Encounters:  09/09/21 153 lb 14.4 oz (69.8 kg)  09/01/21 155 lb 11.2 oz (70.6 kg)  01/21/21 179 lb (81.2 kg)    Body mass index is 27.26 kg/m.  Performance status (ECOG): 1 - Symptomatic but completely ambulatory  PHYSICAL EXAM:  Physical Exam Constitutional:      General: She is not in acute distress.    Appearance: Normal appearance. She is normal weight.  HENT:     Head: Normocephalic and atraumatic.  Eyes:     General: No scleral icterus.    Extraocular Movements: Extraocular movements intact.     Conjunctiva/sclera: Conjunctivae normal.     Pupils: Pupils are equal, round, and reactive to light.  Cardiovascular:     Rate and Rhythm: Normal rate and regular rhythm.     Pulses: Normal pulses.     Heart sounds: Normal heart sounds. No murmur heard.   No friction rub. No gallop.  Pulmonary:     Effort: Pulmonary effort is normal. No respiratory distress.     Breath sounds: Normal breath sounds.  Chest:     Comments: Pacemaker in the  right lateral lower chest. Both reconstructions are negative.  Abdominal:     General: Bowel sounds are normal. There is no distension.     Palpations: Abdomen is soft. There is no hepatomegaly, splenomegaly or mass.     Tenderness: There is no abdominal tenderness.  Musculoskeletal:        General: Normal range of motion.     Cervical back: Normal range of motion and neck supple.     Right lower leg: No edema.     Left lower leg: No edema.  Lymphadenopathy:     Cervical: No cervical adenopathy.  Skin:    General: Skin is warm and dry.  Neurological:     General: No focal deficit present.     Mental Status: She is alert and oriented to person, place, and time. Mental status is at baseline.  Psychiatric:        Mood and Affect: Mood normal.        Behavior: Behavior normal.        Thought Content: Thought content normal.        Judgment: Judgment normal.   LABS:   CBC Latest Ref Rng & Units 09/09/2021 09/01/2021 08/31/2021  WBC - 3.9 1.7(L) 1.9(L)  Hemoglobin 12.0 - 16.0 13.8 12.9 13.0  Hematocrit 36 - 46 42 37.0 37.2  Platelets 150 - 399 232 160 162   CMP Latest Ref Rng & Units 09/09/2021 09/01/2021 08/31/2021  Glucose 70 - 99 mg/dL - 113(H) 102(H)  BUN 4 - 21 26(A) <5(L) <5(L)  Creatinine 0.5 - 1.1 1.4(A) 0.70 0.81  Sodium 137 - 147 137 135 135  Potassium 3.4 - 5.3 4.8 3.9 3.6  Chloride 99 - 108 100 102 98  CO2 13 - 22 23(A) 25 26  Calcium 8.7 - 10.7 10.1 8.9 9.5  Total Protein 6.3 - 8.2 g/dL 9.5(A) 6.8 7.6  Total Bilirubin 0.3 - 1.2 mg/dL - 0.8 0.8  Alkaline Phos 25 - 125 84 70 83  AST 13 - 35 54(A) 49(H) 70(H)  ALT 7 - 35 68(A) 46(H) 63(H)     Lab Results  Component Value Date   CAN125 14.3 10/07/2020    Lab Results  Component Value Date   TIBC 368 12/11/2016   FERRITIN 63 09/24/2017   FERRITIN 78 12/11/2016   IRONPCTSAT 19 12/11/2016   No results found for: LDH    STUDIES:   LE DVT US 09/01/2021: BILATERAL:  - No evidence of deep vein thrombosis  seen in the lower extremities,  bilaterally.    Echocardiogram 08/31/2021:  1. Left ventricular ejection fraction, by estimation, is 40 to 45%,  although visually appears 35-40% The left ventricle has mildly decreased  function. The left ventricle demonstrates global hypokinesis. There is  moderate left ventricular hypertrophy.  Left ventricular diastolic parameters were normal.   2. Right ventricular systolic function is normal. The right ventricular  size is normal.   3. The mitral valve is grossly normal. Mild mitral valve regurgitation.   4. The aortic valve is tricuspid. Aortic valve regurgitation is not  visualized.   5. Compared to previous outpatient study on 12/2019, EF is reduced from  50-55%. Hostoprically, it has been around 40-45%.   EKG 08/31/2021: Sinus rhythm 63 bpm.  Prolonged QTc interval at 546 ms  HISTORY:   Allergies: No Known Allergies  Current Medications: Current Outpatient Medications  Medication Sig Dispense Refill   ENTRESTO 97-103 MG TAKE 1 TABLET BY MOUTH TWICE DAILY (Patient taking differently: Take 1 tablet by mouth 2 (two) times daily.) 180 tablet 3   furosemide (LASIX) 40 MG tablet Take 1 tablet (40 mg total) by mouth daily. 1 tablet 0   magnesium oxide (MAG-OX) 400 MG tablet Take 1/2 tablet (200 mg total) by mouth in the morning and at bedtime. 60 tablet 1   metoprolol succinate (TOPROL-XL) 50 MG 24 hr tablet Take 50 mg by mouth daily. Take with or immediately following a meal.     potassium chloride SA (KLOR-CON) 20 MEQ tablet Take 2 tablets (40 mEq total) by mouth daily. 60 tablet 1   spironolactone (ALDACTONE) 25 MG tablet Take 1 tablet (25 mg total) by mouth daily. 30 tablet 3   No current facility-administered medications for this visit.     ASSESSMENT & PLAN:   Assessment:   1.  Stage IIIB (T2N1M0), triple negative invasive ductal carcinoma, diagnosed in February 2020.   She received neoadjuvant chemotherapy with 6 cycles of TC in  [facility], then subsequently underwent bilateral mastectomies, with evidence of CR.  She received adjuvant radiation therapy here at the [facility] with Dr. Orlene Erm and completed a 50.4 Gy course in October 2020.  I find no evidence of disease.  2.  Carrier for BRCA1.  She is planning on undergoing hysterectomy and bilateral salpingo oophorectomy with GYN-Oncology at [facility], possibly at the beginning of the year.  I will add a CA 125 to her labs today.  If she does not have the surgery, she will need pelvic ultrasounds every 6 months.    3.  Recent hospital admission for episode of syncope and arrhythmias, October 2022. ECHO revealed the left ventricular ejection fraction, by estimation, is 40 to 45%, although visually appears 35-40% with global hypokinesis. She will continue to follow up with cardiology and is scheduled with them on October 25th with cardiac MRI.  4. Renal insufficiency with a change in her creatinine from 0.6 to 1.4 with an elevated BUN of 26.   Plan:   She is doing better since her recent hospital admission. In view of her worsening renal function, I advised that she hold her diuretics until she sees cardiology on Tuesday. She is a carrier for BRCA1, and is planning on undergoing hysterectomy and bilateral salpingo oophorectomy when stable.  She would like to have her breast surgery at the same time, and with her recent cardiac event, I do think this would be beneficial to have 1 surgery rather than 2.  She would like to have her surgery closer to home, and the nearest plastic surgeon would be in [place]. I will add a CA 125 to her labs today, and repeat this every 6 months until she undergoes she undergoes total hysterectomy and bilateral salpingo oophorectomy.  In regards to her breast cancer, she remains without evidence of disease. We will see her back in 5-6 months with CBC, CMP and CA 125 for repeat examination.  She understands and agrees to this plan of care.  I have  answered her questions and she knows to call with any concerns.    I provided 25 minutes of face-to-face time during this this encounter and > 50% was spent counseling as documented under my assessment and plan.    Derwood Kaplan, MD [facility] [address] [place] [address] Dept: [phone] Dept Fax: [phone]   I, Rita Ohara, am acting as scribe for Derwood Kaplan, MD  I have reviewed this report as typed by the medical scribe, and it is complete and accurate.

## 2021-09-08 NOTE — Progress Notes (Deleted)
Follow up visit  Subjective:   Rachel Duncan, female    DOB: 07/03/1981, 40 y.o.   MRN: [id number]   No chief complaint on file.   39 y.o. African American female with nonischemic cardiomyopathy status post cardiac arrest in December 2017, ICD placement for secondary prevention. History of BRCA 1 positive breast cancer s/p bilateral mastectomy followed  by breast implants.   Patient admitted 08/31/2021 - 09/01/2021 following episodes of polymorphic VT with 4 subsequent appropriate ICD shocks.  At the time patient noted to have severe hypokalemia and hypomagnesia as well as elevated D-dimer.  CTA was negative for PE and patient's electrolytes were replaced.  She had no recurrence of VT.  Echocardiogram showed LVEF mildly reduced at approximately 40%.  During hospitalization switch patient from Lasix 40 mg daily to spironolactone 25 mg daily.  She now presents for follow-up.***  ***  She is doing well, denies chest pain, shortness of breath, palpitations, leg edema, orthopnea, PND, TIA/syncope.  Reviewed recent echocardiogram with the patient, details below.    Current Outpatient Medications on File Prior to Visit  Medication Sig Dispense Refill   ENTRESTO 97-103 MG TAKE 1 TABLET BY MOUTH TWICE DAILY (Patient taking differently: Take 1 tablet by mouth 2 (two) times daily.) 180 tablet 3   furosemide (LASIX) 40 MG tablet Take 1 tablet (40 mg total) by mouth daily. 1 tablet 0   magnesium oxide (MAG-OX) 400 MG tablet Take 1/2 tablet (200 mg total) by mouth in the morning and at bedtime. 60 tablet 1   metoprolol succinate (TOPROL-XL) 50 MG 24 hr tablet TAKE 1 TABLET BY MOUTH EVERY DAY WITH OR IMMEDIATELY FOLLOWING A MEAL (Patient taking differently: Take 50 mg by mouth daily.) 90 tablet 0   potassium chloride SA (KLOR-CON) 20 MEQ tablet Take 2 tablets (40 mEq total) by mouth daily. 60 tablet 1   spironolactone (ALDACTONE) 25 MG tablet Take 1 tablet (25 mg total) by mouth daily. 30 tablet 3    No current facility-administered medications on file prior to visit.    Cardiovascular studies: Bilateral lower extremity DVT study 09/01/2021: No evidence of deep vein thrombosis seen in the lower extremities, bilaterally  Echocardiogram 08/31/2021:  1. Left ventricular ejection fraction, by estimation, is 40 to 45%,  although visually appears 35-40% The left ventricle has mildly decreased  function. The left ventricle demonstrates global hypokinesis. There is  moderate left ventricular hypertrophy.  Left ventricular diastolic parameters were normal.   2. Right ventricular systolic function is normal. The right ventricular  size is normal.   3. The mitral valve is grossly normal. Mild mitral valve regurgitation.   4. The aortic valve is tricuspid. Aortic valve regurgitation is not  visualized.   5. Compared to previous outpatient study on 12/2019, EF is reduced from  50-55%. Hostoprically, it has been around 40-45%.   In office ICD Check 01/27/2021: There were 0 episodes detected in the "shock" zone and 0 episodes detected in the conditional shock zone. 55% projected remaining battery capacity.  Longevity is BOS Shock impedance: 80 Normal ICD function. No significant arrhythmias.   EKG 01/21/2021: Sinus rhythm 69 bpm Lead loss V4  Echocardiogram 12/31/2020:  Normal LV systolic function with visual EF 50-55%. Left ventricle cavity  is normal in size. Normal global wall motion. Normal diastolic filling  pattern, normal LAP.  Left atrial cavity is mildly dilated.  Mild to moderate mitral regurgitation.  Mild tricuspid regurgitation. No evidence of pulmonary hypertension.  Mild to moderate pulmonic  regurgitation.  Compared to prior study dated 06/12/2019: LVEF was 45-50% now 50-55%, Mild  TR is new, no PHTN.   EKG 05/06/2020: Sinus rhythm 77 bpm Frequent pvcs  Scheduled Remote ICD check 02/25/2020:  This is a normal S-ICD follow-up. There we11re 0 episodes detected in the  "Shock" zone and 0 episodes detected in the "Conditional Shock" zone. 65 % projected remaining battery capacity. Battery longevity is BOS   Recent labs: CMP Latest Ref Rng & Units 09/01/2021 08/31/2021 10/05/2020  Glucose 70 - 99 mg/dL 113(H) 102(H) 93  BUN 6 - 20 mg/dL <5(L) <5(L) 17  Creatinine 0.44 - 1.00 mg/dL 0.70 0.81 1.01(H)  Sodium 135 - 145 mmol/L 135 135 137  Potassium 3.5 - 5.1 mmol/L 3.9 3.6 3.8  Chloride 98 - 111 mmol/L 102 98 103  CO2 22 - 32 mmol/L _0 Calcium 8.9 - 10.3 mg/dL 8.9 9.5 9.2  Total Protein 6.5 - 8.1 g/dL 6.8 7.6 8.6(H)  Total Bilirubin 0.3 - 1.2 mg/dL 0.8 0.8 0.3  Alkaline Phos 38 - 126 U/L 70 83 73  AST 15 - 41 U/L 49(H) 70(H) 16  ALT 0 - 44 U/L 46(H) 63(H) 13   CBC Latest Ref Rng & Units 09/01/2021 08/31/2021 08/31/2021  WBC 4.0 - 10.5 K/uL 1.7(L) 1.9(L) 2.1(L)  Hemoglobin 12.0 - 15.0 g/dL 12.9 13.0 13.7  Hematocrit 36.0 - 46.0 % 37.0 37.2 40.2  Platelets 150 - 400 K/uL 160 162 188   Lipid Panel     Component Value Date/Time   CHOL 173 01/21/2021 1027   TRIG 78 01/21/2021 1027   HDL 63 01/21/2021 1027   CHOLHDL 2.7 01/21/2021 1027   CHOLHDL 3.5 12/11/2016 1117   VLDL 10 12/11/2016 1117   LDLCALC 95 01/21/2021 1027   HEMOGLOBIN A1C Lab Results  Component Value Date   HGBA1C 5.5 12/11/2016   TSH No results for input(s): TSH in the last 8760 hours.  Other Labs:  10/10/2020: Glucose 93, BUN/Cr 17/1.01. EGFR >60. Na/K 137/3.8. Rest of the CMP normal H/H 12/40. MCV 94. Platelets 211    Review of Systems  Cardiovascular:  Negative for chest pain, dyspnea on exertion, leg swelling, palpitations and syncope.        There were no vitals filed for this visit.    There is no height or weight on file to calculate BMI. There were no vitals filed for this visit.   Objective:   Physical Exam Vitals and nursing note reviewed.  Constitutional:      General: She is not in acute distress. Neck:     Vascular: No JVD.   Cardiovascular:     Rate and Rhythm: Normal rate and regular rhythm.     Pulses: Intact distal pulses.     Heart sounds: Normal heart sounds. No murmur heard. Pulmonary:     Effort: Pulmonary effort is normal.     Breath sounds: Normal breath sounds. No wheezing or rales.        Assessment & Recommendations:   40 y.o.African American female with nonischemic cardiomyopathy status post cardiac arrest in December 2017, ICD placement for secondary prevention. History of BRCA 1 positive breast cancer s/p bilateral mastectomy followed  by breast implants.  *** HFrEF: Nonischemic cardiomyopathy.  Her EF has remained relatively steady post chemotherapy.  Clinically euvolumic today. Continue Entresto 97-103 mg bid, metoprolol succinate 50 mg daily,. Will check lipid panel today for screening  Ventricular tachycardia:    Hypokalemia:   Hypomagnesia:  She will need ICD evaluation  F/u in 1 year  Nigel Mormon, MD [facility] [place] Pager: [phone] Office: [phone] If no answer Cell [phone]

## 2021-09-09 ENCOUNTER — Inpatient Hospital Stay (INDEPENDENT_AMBULATORY_CARE_PROVIDER_SITE_OTHER): Payer: Medicare Other | Admitting: Oncology

## 2021-09-09 ENCOUNTER — Other Ambulatory Visit: Payer: Self-pay | Admitting: Hematology and Oncology

## 2021-09-09 ENCOUNTER — Inpatient Hospital Stay: Payer: Medicare Other | Attending: Oncology

## 2021-09-09 ENCOUNTER — Telehealth: Payer: Self-pay | Admitting: Oncology

## 2021-09-09 ENCOUNTER — Other Ambulatory Visit: Payer: Self-pay | Admitting: Oncology

## 2021-09-09 ENCOUNTER — Encounter: Payer: Self-pay | Admitting: Oncology

## 2021-09-09 VITALS — BP 98/68 | HR 69 | Temp 98.5°F | Resp 18 | Ht 63.0 in | Wt 153.9 lb

## 2021-09-09 DIAGNOSIS — R944 Abnormal results of kidney function studies: Secondary | ICD-10-CM | POA: Diagnosis not present

## 2021-09-09 DIAGNOSIS — Z9221 Personal history of antineoplastic chemotherapy: Secondary | ICD-10-CM | POA: Insufficient documentation

## 2021-09-09 DIAGNOSIS — I429 Cardiomyopathy, unspecified: Secondary | ICD-10-CM | POA: Insufficient documentation

## 2021-09-09 DIAGNOSIS — C50911 Malignant neoplasm of unspecified site of right female breast: Secondary | ICD-10-CM

## 2021-09-09 DIAGNOSIS — R7401 Elevation of levels of liver transaminase levels: Secondary | ICD-10-CM | POA: Insufficient documentation

## 2021-09-09 DIAGNOSIS — Z171 Estrogen receptor negative status [ER-]: Secondary | ICD-10-CM | POA: Insufficient documentation

## 2021-09-09 DIAGNOSIS — Z1501 Genetic susceptibility to malignant neoplasm of breast: Secondary | ICD-10-CM

## 2021-09-09 DIAGNOSIS — Z9013 Acquired absence of bilateral breasts and nipples: Secondary | ICD-10-CM | POA: Insufficient documentation

## 2021-09-09 DIAGNOSIS — N289 Disorder of kidney and ureter, unspecified: Secondary | ICD-10-CM | POA: Insufficient documentation

## 2021-09-09 DIAGNOSIS — Z1502 Genetic susceptibility to malignant neoplasm of ovary: Secondary | ICD-10-CM | POA: Insufficient documentation

## 2021-09-09 DIAGNOSIS — C50511 Malignant neoplasm of lower-outer quadrant of right female breast: Secondary | ICD-10-CM | POA: Diagnosis not present

## 2021-09-09 DIAGNOSIS — Z923 Personal history of irradiation: Secondary | ICD-10-CM | POA: Insufficient documentation

## 2021-09-09 LAB — BASIC METABOLIC PANEL
BUN: 26 — AB (ref 4–21)
CO2: 23 — AB (ref 13–22)
Chloride: 100 (ref 99–108)
Creatinine: 1.4 — AB (ref 0.5–1.1)
Glucose: 137
Potassium: 4.8 (ref 3.4–5.3)
Sodium: 137 (ref 137–147)

## 2021-09-09 LAB — COMPREHENSIVE METABOLIC PANEL
Albumin: 5.3 — AB (ref 3.5–5.0)
Calcium: 10.1 (ref 8.7–10.7)

## 2021-09-09 LAB — CBC AND DIFFERENTIAL
HCT: 42 (ref 36–46)
Hemoglobin: 13.8 (ref 12.0–16.0)
Neutrophils Absolute: 1.83
Platelets: 232 (ref 150–399)
WBC: 3.9

## 2021-09-09 LAB — PROTEIN, TOTAL: Total Protein: 9.5 g/dL — AB (ref 6.3–8.2)

## 2021-09-09 LAB — HEPATIC FUNCTION PANEL
ALT: 68 — AB (ref 7–35)
AST: 54 — AB (ref 13–35)
Alkaline Phosphatase: 84 (ref 25–125)
Bilirubin, Total: 0.7

## 2021-09-09 LAB — CBC
MCV: 104 — AB (ref 81–99)
RBC: 4.06 (ref 3.87–5.11)

## 2021-09-09 NOTE — Telephone Encounter (Signed)
Per 10/21 LOS, patient scheduled for March 2023 Appt's.  Gave patient Appt Summary

## 2021-09-10 LAB — CA 125: Cancer Antigen (CA) 125: 9.5 U/mL (ref 0.0–38.1)

## 2021-09-13 ENCOUNTER — Ambulatory Visit: Payer: Medicaid Other | Admitting: Student

## 2021-09-14 ENCOUNTER — Ambulatory Visit: Payer: Medicaid Other | Admitting: Student

## 2021-09-14 NOTE — Progress Notes (Deleted)
Follow up visit  Subjective:   Rachel Duncan, female    DOB: 07/06/1981, 40 y.o.   MRN: [id number]   No chief complaint on file.   40 y.o. African American female with nonischemic cardiomyopathy status post cardiac arrest in December 2017, ICD placement for secondary prevention. History of BRCA 1 positive breast cancer s/p bilateral mastectomy followed  by breast implants.   Patient admitted 08/31/2021 - 09/01/2021 following episodes of polymorphic VT with 4 subsequent appropriate ICD shocks.  At the time patient noted to have severe hypokalemia and hypomagnesia as well as elevated D-dimer.  CTA was negative for PE and patient's electrolytes were replaced.  She had no recurrence of VT.  Echocardiogram showed LVEF mildly reduced at approximately 40%.  During hospitalization switch patient from Lasix 40 mg daily to spironolactone 25 mg daily.  She now presents for follow-up.***  ***  She is doing well, denies chest pain, shortness of breath, palpitations, leg edema, orthopnea, PND, TIA/syncope.  Reviewed recent echocardiogram with the patient, details below.    Current Outpatient Medications on File Prior to Visit  Medication Sig Dispense Refill   ENTRESTO 97-103 MG TAKE 1 TABLET BY MOUTH TWICE DAILY (Patient taking differently: Take 1 tablet by mouth 2 (two) times daily.) 180 tablet 3   furosemide (LASIX) 40 MG tablet Take 1 tablet (40 mg total) by mouth daily. 1 tablet 0   magnesium oxide (MAG-OX) 400 MG tablet Take 1/2 tablet (200 mg total) by mouth in the morning and at bedtime. 60 tablet 1   metoprolol succinate (TOPROL-XL) 50 MG 24 hr tablet Take 50 mg by mouth daily. Take with or immediately following a meal.     potassium chloride SA (KLOR-CON) 20 MEQ tablet Take 2 tablets (40 mEq total) by mouth daily. 60 tablet 1   spironolactone (ALDACTONE) 25 MG tablet Take 1 tablet (25 mg total) by mouth daily. 30 tablet 3   No current facility-administered medications on file prior to  visit.    Cardiovascular studies: Bilateral lower extremity DVT study 09/01/2021: No evidence of deep vein thrombosis seen in the lower extremities, bilaterally  Echocardiogram 08/31/2021:  1. Left ventricular ejection fraction, by estimation, is 40 to 45%,  although visually appears 35-40% The left ventricle has mildly decreased  function. The left ventricle demonstrates global hypokinesis. There is  moderate left ventricular hypertrophy.  Left ventricular diastolic parameters were normal.   2. Right ventricular systolic function is normal. The right ventricular  size is normal.   3. The mitral valve is grossly normal. Mild mitral valve regurgitation.   4. The aortic valve is tricuspid. Aortic valve regurgitation is not  visualized.   5. Compared to previous outpatient study on 12/2019, EF is reduced from  50-55%. Hostoprically, it has been around 40-45%.   In office ICD Check 01/27/2021: There were 0 episodes detected in the "shock" zone and 0 episodes detected in the conditional shock zone. 55% projected remaining battery capacity.  Longevity is BOS Shock impedance: 80 Normal ICD function. No significant arrhythmias.   EKG 01/21/2021: Sinus rhythm 69 bpm Lead loss V4  Echocardiogram 12/31/2020:  Normal LV systolic function with visual EF 50-55%. Left ventricle cavity  is normal in size. Normal global wall motion. Normal diastolic filling  pattern, normal LAP.  Left atrial cavity is mildly dilated.  Mild to moderate mitral regurgitation.  Mild tricuspid regurgitation. No evidence of pulmonary hypertension.  Mild to moderate pulmonic regurgitation.  Compared to prior study dated 06/12/2019: LVEF was  45-50% now 50-55%, Mild  TR is new, no PHTN.   EKG 05/06/2020: Sinus rhythm 77 bpm Frequent pvcs  Scheduled Remote ICD check 02/25/2020:  This is a normal S-ICD follow-up. There we11re 0 episodes detected in the "Shock" zone and 0 episodes detected in the "Conditional Shock"  zone. 65 % projected remaining battery capacity. Battery longevity is BOS   Recent labs: CMP Latest Ref Rng & Units 09/09/2021 09/01/2021 08/31/2021  Glucose 70 - 99 mg/dL - 113(H) 102(H)  BUN 4 - 21 26(A) <5(L) <5(L)  Creatinine 0.5 - 1.1 1.4(A) 0.70 0.81  Sodium 137 - 147 137 135 135  Potassium 3.4 - 5.3 4.8 3.9 3.6  Chloride 99 - 108 100 102 98  CO2 13 - 22 23(A) 25 26  Calcium 8.7 - 10.7 10.1 8.9 9.5  Total Protein 6.3 - 8.2 g/dL 9.5(A) 6.8 7.6  Total Bilirubin 0.3 - 1.2 mg/dL - 0.8 0.8  Alkaline Phos 25 - 125 84 70 83  AST 13 - 35 54(A) 49(H) 70(H)  ALT 7 - 35 68(A) 46(H) 63(H)   CBC Latest Ref Rng & Units 09/09/2021 09/01/2021 08/31/2021  WBC - 3.9 1.7(L) 1.9(L)  Hemoglobin 12.0 - 16.0 13.8 12.9 13.0  Hematocrit 36 - 46 42 37.0 37.2  Platelets 150 - 399 232 160 162   Lipid Panel     Component Value Date/Time   CHOL 173 01/21/2021 1027   TRIG 78 01/21/2021 1027   HDL 63 01/21/2021 1027   CHOLHDL 2.7 01/21/2021 1027   CHOLHDL 3.5 12/11/2016 1117   VLDL 10 12/11/2016 1117   LDLCALC 95 01/21/2021 1027   HEMOGLOBIN A1C Lab Results  Component Value Date   HGBA1C 5.5 12/11/2016   TSH No results for input(s): TSH in the last 8760 hours.  Other Labs:  10/10/2020: Glucose 93, BUN/Cr 17/1.01. EGFR >60. Na/K 137/3.8. Rest of the CMP normal H/H 12/40. MCV 94. Platelets 211    Review of Systems  Cardiovascular:  Negative for chest pain, dyspnea on exertion, leg swelling, palpitations and syncope.        There were no vitals filed for this visit.    There is no height or weight on file to calculate BMI. There were no vitals filed for this visit.   Objective:   Physical Exam Vitals and nursing note reviewed.  Constitutional:      General: She is not in acute distress. Neck:     Vascular: No JVD.  Cardiovascular:     Rate and Rhythm: Normal rate and regular rhythm.     Pulses: Intact distal pulses.     Heart sounds: Normal heart sounds. No murmur  heard. Pulmonary:     Effort: Pulmonary effort is normal.     Breath sounds: Normal breath sounds. No wheezing or rales.        Assessment & Recommendations:   40 y.o.African American female with nonischemic cardiomyopathy status post cardiac arrest in December 2017, ICD placement for secondary prevention. History of BRCA 1 positive breast cancer s/p bilateral mastectomy followed  by breast implants.  *** HFrEF: Nonischemic cardiomyopathy.  Her EF has remained relatively steady post chemotherapy.  Clinically euvolumic today. Continue Entresto 97-103 mg bid, metoprolol succinate 50 mg daily,. Will check lipid panel today for screening  Ventricular tachycardia:    Hypokalemia:   Hypomagnesia:     She will need ICD evaluation  F/u in 1 year  Nigel Mormon, MD [facility] Pager: [phone] Office: [phone] If no  answer Cell [phone]

## 2021-09-26 ENCOUNTER — Ambulatory Visit: Payer: Medicaid Other | Admitting: Student

## 2022-01-23 ENCOUNTER — Ambulatory Visit: Payer: Medicare Other | Admitting: Cardiology

## 2022-01-23 NOTE — Progress Notes (Signed)
Error

## 2022-02-06 ENCOUNTER — Encounter: Payer: Self-pay | Admitting: Cardiology

## 2022-02-06 ENCOUNTER — Other Ambulatory Visit: Payer: Self-pay

## 2022-02-06 ENCOUNTER — Ambulatory Visit: Payer: Medicare Other | Admitting: Cardiology

## 2022-02-06 VITALS — BP 118/72 | HR 67 | Temp 98.0°F | Resp 16 | Ht 63.0 in | Wt 150.0 lb

## 2022-02-06 DIAGNOSIS — I428 Other cardiomyopathies: Secondary | ICD-10-CM

## 2022-02-06 DIAGNOSIS — I469 Cardiac arrest, cause unspecified: Secondary | ICD-10-CM

## 2022-02-06 DIAGNOSIS — R55 Syncope and collapse: Secondary | ICD-10-CM | POA: Insufficient documentation
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
## 2022-02-08 ENCOUNTER — Ambulatory Visit: Payer: Medicaid Other | Admitting: Oncology

## 2022-02-08 ENCOUNTER — Other Ambulatory Visit: Payer: Medicaid Other

## 2022-02-08 NOTE — Progress Notes (Deleted)
?[facility]  ?[address] ?[place],  [place]  [address] ?[phone] ? ?Clinic Day:  02/08/2022 ? ?Referring physician: No ref. provider found ? ?This document serves as a record of services personally performed by Hosie Poisson, MD. It was created on their behalf by Curry,Lauren E, a trained medical scribe. The creation of this record is based on the scribe's personal observations and the provider's statements to them. ? ?CHIEF COMPLAINT:  ?CC: History of stage IIIB triple negative invasive ductal carcinoma ? ?Current Treatment:  Surveillance ? ? ?HISTORY OF PRESENT ILLNESS:  ?Rachel Duncan is a 41 y.o. female who presented to our clinic in 2021 for a transfer of care with a history of stage IIIB (T2N1M0), triple negative invasive ductal carcinoma, diagnosed in February 2020.   She is a carrier for BRCA1.  This was found when the patient noticed a mass of the right breast, which was believed to be a cyst.  Mammography revealed a roughly 2.5 cm lesion with associated calcifications, as well as two additional smaller masses.  Biopsy was consistent with invasive ductal carcinoma, grade 3, triple negative.  Axillary lymphadenopathy was also seen.  Original CT imaging revealed a lucent lesion of the sternum, but subsequent bone scan was negative.  She received neoadjuvant chemotherapy with 6 cycles of TC in [facility], then subsequently underwent bilateral mastectomies and right sentinel lymph node biopsy with tissue expander reconstruction.   Pathology from her mastectomy revealed no residual carcinoma involving the right breast, for a CR.  Lymph nodes were negative.  She received adjuvant radiation therapy here at the [facility] with Dr. Orlene Erm and completed a 50.4 Gy course in October 2020.  She started menarche at age 13 and has had no further menstrual periods since her chemotherapyl.  She had her first child at the age of 27.  She has previously used oral  contraceptives, and has never been on hormone replacement therapy.   ? ?INTERVAL HISTORY:  ?Irving comes in today after not being seen since November 2021. She presented to [facility] emergency department on October 12th due to syncope and arrhythmias. She was transferred to [facility] for evaluation and work up by cardiology. ECHO revealed an EF by estimation of 40-45%, but visually appears to be 35-40% with global hypokinesis. She was started on Lasix 40 mg daily spironolactone 25 mg daily. She is scheduled for follow up with cardiology on October 25th with outpatient cardiac MRI. She states that she is doing better. Her palpitations and lower extremity edema have resolved. She still has not undergone hysterectomy as this was to be done at that same time of her breast surgery. She still needs the spacers removed and replaced with the permanent implants. She did have leukopenia during that admission, which has now resolved.  Blood counts and chemistries are unremarkable except for a BUN of 26, a creatinine of 1.4, mildly elevated liver transaminases. Total protein is elevated at 9.5 but albumin is also elevated at 5.3. I advised that she hold both diuretics for now, until she follows up with cardiology on Tuesday, 09/20/21. Her  appetite is up and down, and she has lost 31 pounds since her last visit.  She denies fever, chills or other signs of infection.  She denies nausea, vomiting, bowel issues, or abdominal pain.  She denies sore throat, cough, dyspnea, or chest pain. ? ?REVIEW OF SYSTEMS:  ?Review of Systems  ?Constitutional:  Negative for chills, fatigue, fever and unexpected weight change. Appetite  change: comes and goes. ?HENT:  Negative.    ?Eyes: Negative.   ?Respiratory: Negative.  Negative for chest tightness, cough, hemoptysis, shortness of breath and wheezing.   ?Cardiovascular:  Negative for chest pain and leg swelling. Palpitations: during her hospital stay. ?Gastrointestinal: Negative.  Negative for  abdominal distention, abdominal pain, blood in stool, constipation, diarrhea, nausea and vomiting.  ?Endocrine: Negative.   ?Genitourinary: Negative.  Negative for difficulty urinating, dysuria, frequency and hematuria.   ?Musculoskeletal: Negative.  Negative for arthralgias, back pain, flank pain, gait problem and myalgias.  ?Skin: Negative.   ?Neurological: Negative.  Negative for dizziness, extremity weakness, gait problem, headaches, light-headedness, numbness, seizures and speech difficulty.  ?Hematological: Negative.   ?Psychiatric/Behavioral: Negative.  Negative for depression and sleep disturbance. The patient is not nervous/anxious.   ?All other systems reviewed and are negative.  ? ?VITALS:  ?unknown if currently breastfeeding.  ?Wt Readings from Last 3 Encounters:  ?02/06/22 150 lb (68 kg)  ?09/09/21 153 lb 14.4 oz (69.8 kg)  ?09/01/21 155 lb 11.2 oz (70.6 kg)  ?  ?There is no height or weight on file to calculate BMI. ? ?Performance status (ECOG): 1 - Symptomatic but completely ambulatory ? ?PHYSICAL EXAM:  ?Physical Exam ?Constitutional:   ?   General: She is not in acute distress. ?   Appearance: Normal appearance. She is normal weight.  ?HENT:  ?   Head: Normocephalic and atraumatic.  ?Eyes:  ?   General: No scleral icterus. ?   Extraocular Movements: Extraocular movements intact.  ?   Conjunctiva/sclera: Conjunctivae normal.  ?   Pupils: Pupils are equal, round, and reactive to light.  ?Cardiovascular:  ?   Rate and Rhythm: Normal rate and regular rhythm.  ?   Pulses: Normal pulses.  ?   Heart sounds: Normal heart sounds. No murmur heard. ?  No friction rub. No gallop.  ?Pulmonary:  ?   Effort: Pulmonary effort is normal. No respiratory distress.  ?   Breath sounds: Normal breath sounds.  ?Chest:  ?   Comments: Pacemaker in the right lateral lower chest. Both reconstructions are negative.  ?Abdominal:  ?   General: Bowel sounds are normal. There is no distension.  ?   Palpations: Abdomen is soft.  There is no hepatomegaly, splenomegaly or mass.  ?   Tenderness: There is no abdominal tenderness.  ?Musculoskeletal:     ?   General: Normal range of motion.  ?   Cervical back: Normal range of motion and neck supple.  ?   Right lower leg: No edema.  ?   Left lower leg: No edema.  ?Lymphadenopathy:  ?   Cervical: No cervical adenopathy.  ?Skin: ?   General: Skin is warm and dry.  ?Neurological:  ?   General: No focal deficit present.  ?   Mental Status: She is alert and oriented to person, place, and time. Mental status is at baseline.  ?Psychiatric:     ?   Mood and Affect: Mood normal.     ?   Behavior: Behavior normal.     ?   Thought Content: Thought content normal.     ?   Judgment: Judgment normal.  ? ?LABS:  ? ? ?  Latest Ref Rng & Units 09/09/2021  ? 12:00 AM 09/01/2021  ?  1:56 AM 08/31/2021  ? 10:34 PM  ?CBC  ?WBC  3.9      1.7   1.9    ?Hemoglobin 12.0 - 16.0 13.8  12.9   13.0    ?Hematocrit 36 - 46 42      37.0   37.2    ?Platelets 150 - 399 232      160   162    ?  ? This result is from an external source.  ? ? ? ?  Latest Ref Rng & Units 09/09/2021  ? 12:00 AM 09/01/2021  ?  1:56 AM 08/31/2021  ?  2:09 PM  ?CMP  ?Glucose 70 - 99 mg/dL  113   102    ?BUN 4 - 21 26      <5   <5    ?Creatinine 0.5 - 1.1 1.4      0.70   0.81    ?Sodium 137 - 147 137      135   135    ?Potassium 3.4 - 5.3 4.8      3.9   3.6    ?Chloride 99 - 108 100      102   98    ?CO2 13 - _0 ?Calcium 8.7 - 10.7 10.1      8.9   9.5    ?Total Protein 6.3 - 8.2 g/dL 9.5      6.8   7.6    ?Total Bilirubin 0.3 - 1.2 mg/dL  0.8   0.8    ?Alkaline Phos 25 - 125 84      70   83    ?AST 13 - 35 54      49   70    ?ALT 7 - 35 68      46   63    ?  ? This result is from an external source.  ? ? ? ? ?Lab Results  ?Component Value Date  ? HER740 9.5 09/09/2021  ? ? ?Lab Results  ?Component Value Date  ? TIBC 368 12/11/2016  ? FERRITIN 63 09/24/2017  ? FERRITIN 78 12/11/2016  ? IRONPCTSAT 19 12/11/2016  ? ?No results found  for: LDH   ? ?STUDIES:  ? ?LE DVT US 09/01/2021: ?BILATERAL:  ?- No evidence of deep vein thrombosis seen in the lower extremities,  ?bilaterally.  ?  ?Echocardiogram 08/31/2021: ? 1. Left ventricular ejection fraction, by esti

## 2022-02-16 LAB — CBC
Hematocrit: 31.7 % — ABNORMAL LOW (ref 34.0–46.6)
Hemoglobin: 10.7 g/dL — ABNORMAL LOW (ref 11.1–15.9)
MCH: 33.5 pg — ABNORMAL HIGH (ref 26.6–33.0)
MCHC: 33.8 g/dL (ref 31.5–35.7)
MCV: 99 fL — ABNORMAL HIGH (ref 79–97)
Platelets: 234 10*3/uL (ref 150–450)
RBC: 3.19 x10E6/uL — ABNORMAL LOW (ref 3.77–5.28)
RDW: 13.5 % (ref 11.7–15.4)
WBC: 4.8 10*3/uL (ref 3.4–10.8)

## 2022-02-16 LAB — MAGNESIUM: Magnesium: 2.1 mg/dL (ref 1.6–2.3)

## 2022-02-16 LAB — COMPREHENSIVE METABOLIC PANEL
ALT: 6 IU/L (ref 0–32)
AST: 13 IU/L (ref 0–40)
Albumin/Globulin Ratio: 1.4 (ref 1.2–2.2)
Albumin: 4.2 g/dL (ref 3.8–4.8)
Alkaline Phosphatase: 51 IU/L (ref 44–121)
BUN/Creatinine Ratio: 24 — ABNORMAL HIGH (ref 9–23)
BUN: 17 mg/dL (ref 6–20)
Bilirubin Total: <0.2 mg/dL (ref 0.0–1.2)
CO2: 22 mmol/L (ref 20–29)
Calcium: 9 mg/dL (ref 8.7–10.2)
Chloride: 101 mmol/L (ref 96–106)
Creatinine, Ser: 0.7 mg/dL (ref 0.57–1.00)
Globulin, Total: 3 g/dL (ref 1.5–4.5)
Glucose: 78 mg/dL (ref 70–99)
Potassium: 4 mmol/L (ref 3.5–5.2)
Sodium: 135 mmol/L (ref 134–144)
Total Protein: 7.2 g/dL (ref 6.0–8.5)
eGFR: 113 mL/min/{1.73_m2} (ref >59)

## 2022-03-02 ENCOUNTER — Ambulatory Visit: Payer: Medicaid Other | Admitting: Hematology and Oncology

## 2022-03-02 ENCOUNTER — Other Ambulatory Visit: Payer: Medicaid Other

## 2022-03-10 ENCOUNTER — Ambulatory Visit: Payer: Medicare Other | Admitting: Cardiology

## 2022-03-13 ENCOUNTER — Ambulatory Visit: Payer: Medicare Other | Admitting: Cardiology

## 2022-03-15 ENCOUNTER — Ambulatory Visit (HOSPITAL_COMMUNITY): Admission: RE | Admit: 2022-03-15 | Payer: Medicare Other | Source: Ambulatory Visit

## 2022-03-24 ENCOUNTER — Ambulatory Visit: Payer: Medicare Other | Admitting: Cardiology

## 2022-04-24 ENCOUNTER — Telehealth (HOSPITAL_COMMUNITY): Payer: Self-pay | Admitting: *Deleted

## 2022-04-24 NOTE — Telephone Encounter (Signed)
Attempted to call patient regarding upcoming cardiac MRI appointment. Patient did not pick up phone nor was I able to leave a message.  Gordy Clement RN Navigator Cardiac Imaging [facility] [phone] Office [phone] Cell

## 2022-04-25 ENCOUNTER — Ambulatory Visit (HOSPITAL_COMMUNITY)
Admission: RE | Admit: 2022-04-25 | Discharge: 2022-04-25 | Disposition: A | Discharge status: Home / Self Care | Payer: Medicare Other | Source: Ambulatory Visit | Attending: Cardiology | Admitting: Cardiology

## 2022-04-25 DIAGNOSIS — Z95810 Presence of automatic (implantable) cardiac defibrillator: Secondary | ICD-10-CM

## 2022-05-10 ENCOUNTER — Other Ambulatory Visit (HOSPITAL_COMMUNITY): Payer: Self-pay

## 2022-12-21 ENCOUNTER — Telehealth: Payer: Self-pay

## 2022-12-21 NOTE — Telephone Encounter (Signed)
have made several attempts to contact patient. NA, LMAM. I have also sent a letter.

## 2022-12-21 NOTE — Telephone Encounter (Signed)
I have made several attempts to contact patient. NA, LMAM. I have also sent a letter.
